# Patient Record
Sex: Male | Born: 1965
Health system: Southern US, Community
[De-identification: ages and names within clinical notes are randomized; demographics above are authoritative.]

## PROBLEM LIST (undated history)

## (undated) DIAGNOSIS — R569 Unspecified convulsions: Secondary | ICD-10-CM

## (undated) DIAGNOSIS — N200 Calculus of kidney: Secondary | ICD-10-CM

## (undated) DIAGNOSIS — I739 Peripheral vascular disease, unspecified: Secondary | ICD-10-CM

## (undated) DIAGNOSIS — I10 Essential (primary) hypertension: Secondary | ICD-10-CM

## (undated) DIAGNOSIS — G8929 Other chronic pain: Secondary | ICD-10-CM

## (undated) DIAGNOSIS — I2510 Atherosclerotic heart disease of native coronary artery without angina pectoris: Secondary | ICD-10-CM

## (undated) DIAGNOSIS — K922 Gastrointestinal hemorrhage, unspecified: Secondary | ICD-10-CM

## (undated) DIAGNOSIS — I209 Angina pectoris, unspecified: Secondary | ICD-10-CM

## (undated) DIAGNOSIS — E7800 Pure hypercholesterolemia, unspecified: Secondary | ICD-10-CM

## (undated) DIAGNOSIS — K219 Gastro-esophageal reflux disease without esophagitis: Secondary | ICD-10-CM

## (undated) DIAGNOSIS — R0602 Shortness of breath: Secondary | ICD-10-CM

## (undated) DIAGNOSIS — M545 Low back pain, unspecified: Secondary | ICD-10-CM

## (undated) DIAGNOSIS — M109 Gout, unspecified: Secondary | ICD-10-CM

## (undated) DIAGNOSIS — T7840XA Allergy, unspecified, initial encounter: Secondary | ICD-10-CM

## (undated) DIAGNOSIS — E109 Type 1 diabetes mellitus without complications: Secondary | ICD-10-CM

## (undated) DIAGNOSIS — R9431 Abnormal electrocardiogram [ECG] [EKG]: Secondary | ICD-10-CM

## (undated) DIAGNOSIS — J45909 Unspecified asthma, uncomplicated: Secondary | ICD-10-CM

## (undated) DIAGNOSIS — F419 Anxiety disorder, unspecified: Secondary | ICD-10-CM

## (undated) DIAGNOSIS — I219 Acute myocardial infarction, unspecified: Secondary | ICD-10-CM

## (undated) DIAGNOSIS — E119 Type 2 diabetes mellitus without complications: Secondary | ICD-10-CM

## (undated) DIAGNOSIS — J189 Pneumonia, unspecified organism: Secondary | ICD-10-CM

## (undated) DIAGNOSIS — M1990 Unspecified osteoarthritis, unspecified site: Secondary | ICD-10-CM

## (undated) DIAGNOSIS — R51 Headache: Secondary | ICD-10-CM

## (undated) HISTORY — DX: Type 2 diabetes mellitus without complications: E11.9

## (undated) HISTORY — DX: Unspecified osteoarthritis, unspecified site: M19.90

## (undated) HISTORY — DX: Allergy, unspecified, initial encounter: T78.40XA

## (undated) HISTORY — DX: Abnormal electrocardiogram (ECG) (EKG): R94.31

---

## 1988-09-19 DIAGNOSIS — J189 Pneumonia, unspecified organism: Secondary | ICD-10-CM

## 1988-09-19 HISTORY — DX: Pneumonia, unspecified organism: J18.9

## 1997-01-19 HISTORY — PX: LACERATION REPAIR: SHX5168

## 1997-01-19 HISTORY — PX: BURR HOLE OF CRANIUM: SUR169

## 1997-06-15 ENCOUNTER — Inpatient Hospital Stay (HOSPITAL_COMMUNITY): Admission: EM | Admit: 1997-06-15 | Discharge: 1997-06-27 | Payer: Self-pay | Admitting: Emergency Medicine

## 1997-06-27 ENCOUNTER — Inpatient Hospital Stay (HOSPITAL_COMMUNITY)
Admission: RE | Admit: 1997-06-27 | Discharge: 1997-08-01 | Payer: Self-pay | Admitting: Physical Medicine and Rehabilitation

## 1997-08-02 ENCOUNTER — Encounter
Admission: RE | Admit: 1997-08-02 | Discharge: 1997-10-31 | Payer: Self-pay | Admitting: Physical Medicine and Rehabilitation

## 2005-10-19 DIAGNOSIS — I219 Acute myocardial infarction, unspecified: Secondary | ICD-10-CM

## 2005-10-19 HISTORY — DX: Acute myocardial infarction, unspecified: I21.9

## 2005-10-19 HISTORY — PX: CORONARY ANGIOPLASTY WITH STENT PLACEMENT: SHX49

## 2007-04-19 ENCOUNTER — Ambulatory Visit: Payer: Self-pay | Admitting: Internal Medicine

## 2007-04-19 ENCOUNTER — Encounter (INDEPENDENT_AMBULATORY_CARE_PROVIDER_SITE_OTHER): Payer: Self-pay | Admitting: Nurse Practitioner

## 2007-04-19 LAB — CONVERTED CEMR LAB
ALT: 34 units/L (ref 0–53)
AST: 30 units/L (ref 0–37)
Albumin: 4.7 g/dL (ref 3.5–5.2)
Basophils Absolute: 0 10*3/uL (ref 0.0–0.1)
Basophils Relative: 0 % (ref 0–1)
CO2: 23 meq/L (ref 19–32)
Creatinine, Ser: 1.28 mg/dL (ref 0.40–1.50)
Glucose, Bld: 104 mg/dL — ABNORMAL HIGH (ref 70–99)
Lymphocytes Relative: 59 % — ABNORMAL HIGH (ref 12–46)
MCHC: 32.7 g/dL (ref 30.0–36.0)
MCV: 85 fL (ref 78.0–100.0)
Monocytes Absolute: 0.5 10*3/uL (ref 0.1–1.0)
Neutro Abs: 1.7 10*3/uL (ref 1.7–7.7)
Potassium: 4.7 meq/L (ref 3.5–5.3)
Sodium: 142 meq/L (ref 135–145)

## 2007-04-22 ENCOUNTER — Ambulatory Visit: Payer: Self-pay | Admitting: *Deleted

## 2007-05-12 ENCOUNTER — Ambulatory Visit: Payer: Self-pay | Admitting: Family Medicine

## 2007-05-12 ENCOUNTER — Encounter (INDEPENDENT_AMBULATORY_CARE_PROVIDER_SITE_OTHER): Payer: Self-pay | Admitting: Nurse Practitioner

## 2007-05-12 LAB — CONVERTED CEMR LAB: Total CHOL/HDL Ratio: 7.2

## 2007-07-11 ENCOUNTER — Ambulatory Visit: Payer: Self-pay | Admitting: Internal Medicine

## 2007-07-26 ENCOUNTER — Encounter (INDEPENDENT_AMBULATORY_CARE_PROVIDER_SITE_OTHER): Payer: Self-pay | Admitting: Nurse Practitioner

## 2007-07-26 ENCOUNTER — Ambulatory Visit: Payer: Self-pay | Admitting: Internal Medicine

## 2007-07-26 LAB — CONVERTED CEMR LAB
AST: 24 units/L (ref 0–37)
Albumin: 4.4 g/dL (ref 3.5–5.2)
Alkaline Phosphatase: 74 units/L (ref 39–117)
Chloride: 106 meq/L (ref 96–112)
Potassium: 4.1 meq/L (ref 3.5–5.3)
Sodium: 142 meq/L (ref 135–145)
Total Protein: 7.5 g/dL (ref 6.0–8.3)
Triglycerides: 79 mg/dL (ref <150)
VLDL: 16 mg/dL (ref 0–40)

## 2007-09-28 ENCOUNTER — Ambulatory Visit: Payer: Self-pay | Admitting: Internal Medicine

## 2007-09-28 LAB — CONVERTED CEMR LAB: Microalb, Ur: 78.8 mg/dL — ABNORMAL HIGH (ref 0.00–1.89)

## 2008-02-08 ENCOUNTER — Encounter: Payer: Self-pay | Admitting: Internal Medicine

## 2008-02-08 ENCOUNTER — Other Ambulatory Visit: Admission: RE | Admit: 2008-02-08 | Discharge: 2008-02-08 | Payer: Self-pay | Admitting: Internal Medicine

## 2008-02-08 ENCOUNTER — Ambulatory Visit: Payer: Self-pay | Admitting: Family Medicine

## 2008-02-28 ENCOUNTER — Ambulatory Visit: Payer: Self-pay | Admitting: Family Medicine

## 2008-03-08 ENCOUNTER — Encounter (INDEPENDENT_AMBULATORY_CARE_PROVIDER_SITE_OTHER): Payer: Self-pay | Admitting: Internal Medicine

## 2008-03-08 ENCOUNTER — Ambulatory Visit: Payer: Self-pay | Admitting: Internal Medicine

## 2008-03-08 LAB — CONVERTED CEMR LAB
ALT: 20 units/L (ref 0–53)
AST: 27 units/L (ref 0–37)
Calcium: 9.6 mg/dL (ref 8.4–10.5)
Chloride: 108 meq/L (ref 96–112)
Cholesterol: 196 mg/dL (ref 0–200)
Creatinine, Ser: 1.09 mg/dL (ref 0.40–1.50)
Total Bilirubin: 0.2 mg/dL — ABNORMAL LOW (ref 0.3–1.2)
VLDL: 26 mg/dL (ref 0–40)

## 2008-05-10 ENCOUNTER — Ambulatory Visit: Payer: Self-pay | Admitting: Internal Medicine

## 2008-05-15 ENCOUNTER — Ambulatory Visit: Payer: Self-pay | Admitting: Internal Medicine

## 2008-05-22 ENCOUNTER — Ambulatory Visit: Payer: Self-pay | Admitting: Internal Medicine

## 2008-05-22 ENCOUNTER — Encounter (INDEPENDENT_AMBULATORY_CARE_PROVIDER_SITE_OTHER): Payer: Self-pay | Admitting: Internal Medicine

## 2008-05-22 LAB — CONVERTED CEMR LAB
Bilirubin Urine: NEGATIVE
Nitrite: NEGATIVE
Protein, ur: 100 mg/dL — AB
RBC / HPF: NONE SEEN (ref <3)
Urobilinogen, UA: 0.2 (ref 0.0–1.0)
pH: 5.5 (ref 5.0–8.0)

## 2008-07-04 ENCOUNTER — Ambulatory Visit: Payer: Self-pay | Admitting: Internal Medicine

## 2008-07-16 ENCOUNTER — Ambulatory Visit: Payer: Self-pay | Admitting: Internal Medicine

## 2008-08-28 ENCOUNTER — Ambulatory Visit: Payer: Self-pay | Admitting: Internal Medicine

## 2008-09-05 ENCOUNTER — Ambulatory Visit: Payer: Self-pay | Admitting: Internal Medicine

## 2008-09-11 ENCOUNTER — Ambulatory Visit: Payer: Self-pay | Admitting: Internal Medicine

## 2008-10-25 ENCOUNTER — Ambulatory Visit: Payer: Self-pay | Admitting: Family Medicine

## 2008-11-15 ENCOUNTER — Ambulatory Visit (HOSPITAL_COMMUNITY): Admission: RE | Admit: 2008-11-15 | Discharge: 2008-11-15 | Payer: Self-pay | Admitting: Internal Medicine

## 2008-11-15 ENCOUNTER — Encounter: Payer: Self-pay | Admitting: Cardiovascular Disease

## 2008-11-15 ENCOUNTER — Encounter (INDEPENDENT_AMBULATORY_CARE_PROVIDER_SITE_OTHER): Payer: Self-pay | Admitting: Internal Medicine

## 2008-11-15 ENCOUNTER — Ambulatory Visit: Payer: Self-pay | Admitting: Internal Medicine

## 2008-11-15 LAB — CONVERTED CEMR LAB
Amphetamine Screen, Ur: NEGATIVE
Barbiturate Quant, Ur: NEGATIVE
Creatinine,U: 146.3 mg/dL
HCT: 41.5 % (ref 39.0–52.0)
Hemoglobin: 13.4 g/dL (ref 13.0–17.0)
Hgb A1c MFr Bld: 6.5 % — ABNORMAL HIGH (ref 4.6–6.1)
Lymphs Abs: 4.2 10*3/uL — ABNORMAL HIGH (ref 0.7–4.0)
MCV: 87 fL (ref 78.0–100.0)
Methadone: NEGATIVE
Monocytes Relative: 10 % (ref 3–12)
Neutro Abs: 1.8 10*3/uL (ref 1.7–7.7)
Opiate Screen, Urine: NEGATIVE
Propoxyphene: NEGATIVE

## 2008-11-23 ENCOUNTER — Ambulatory Visit: Payer: Self-pay | Admitting: Internal Medicine

## 2008-11-23 ENCOUNTER — Encounter (INDEPENDENT_AMBULATORY_CARE_PROVIDER_SITE_OTHER): Payer: Self-pay | Admitting: Internal Medicine

## 2008-11-23 LAB — CONVERTED CEMR LAB
Albumin: 4.4 g/dL (ref 3.5–5.2)
Calcium: 9.4 mg/dL (ref 8.4–10.5)
Creatinine, Ser: 1.14 mg/dL (ref 0.40–1.50)
HDL: 54 mg/dL (ref >39)
Sodium: 141 meq/L (ref 135–145)
Total Bilirubin: 0.3 mg/dL (ref 0.3–1.2)
Total CHOL/HDL Ratio: 5.2
Total Protein: 7.3 g/dL (ref 6.0–8.3)
Triglycerides: 186 mg/dL — ABNORMAL HIGH (ref <150)

## 2008-11-29 ENCOUNTER — Ambulatory Visit: Payer: Self-pay | Admitting: Internal Medicine

## 2008-12-11 DIAGNOSIS — R079 Chest pain, unspecified: Secondary | ICD-10-CM

## 2008-12-12 ENCOUNTER — Ambulatory Visit: Payer: Self-pay | Admitting: Cardiovascular Disease

## 2008-12-12 DIAGNOSIS — R9431 Abnormal electrocardiogram [ECG] [EKG]: Secondary | ICD-10-CM

## 2009-01-08 ENCOUNTER — Ambulatory Visit: Payer: Self-pay

## 2009-01-08 ENCOUNTER — Encounter: Payer: Self-pay | Admitting: Cardiovascular Disease

## 2009-01-08 ENCOUNTER — Ambulatory Visit (HOSPITAL_COMMUNITY): Admission: RE | Admit: 2009-01-08 | Discharge: 2009-01-08 | Payer: Self-pay | Admitting: Cardiovascular Disease

## 2009-01-08 ENCOUNTER — Ambulatory Visit: Payer: Self-pay | Admitting: Cardiology

## 2009-01-22 ENCOUNTER — Ambulatory Visit: Payer: Self-pay | Admitting: Internal Medicine

## 2009-01-22 LAB — CONVERTED CEMR LAB
Albumin: 4.2 g/dL (ref 3.5–5.2)
Alkaline Phosphatase: 72 units/L (ref 39–117)
Bilirubin, Direct: 0.1 mg/dL (ref 0.0–0.3)
Cholesterol: 167 mg/dL (ref 0–200)
Indirect Bilirubin: 0.2 mg/dL (ref 0.0–0.9)
Total Protein: 7.2 g/dL (ref 6.0–8.3)
VLDL: 20 mg/dL (ref 0–40)

## 2009-03-13 ENCOUNTER — Ambulatory Visit: Payer: Self-pay | Admitting: Family Medicine

## 2009-03-13 ENCOUNTER — Encounter (INDEPENDENT_AMBULATORY_CARE_PROVIDER_SITE_OTHER): Payer: Self-pay | Admitting: Internal Medicine

## 2009-03-13 LAB — CONVERTED CEMR LAB: Helicobacter Pylori Antibody-IgG: 0.4

## 2009-03-15 ENCOUNTER — Ambulatory Visit: Payer: Self-pay | Admitting: Internal Medicine

## 2009-04-15 ENCOUNTER — Emergency Department (HOSPITAL_COMMUNITY): Admission: EM | Admit: 2009-04-15 | Discharge: 2009-04-15 | Payer: Self-pay | Admitting: Emergency Medicine

## 2009-04-26 ENCOUNTER — Ambulatory Visit: Payer: Self-pay | Admitting: Internal Medicine

## 2009-04-29 ENCOUNTER — Ambulatory Visit (HOSPITAL_COMMUNITY): Admission: RE | Admit: 2009-04-29 | Discharge: 2009-04-29 | Payer: Self-pay | Admitting: Internal Medicine

## 2009-05-08 ENCOUNTER — Ambulatory Visit: Payer: Self-pay | Admitting: Family Medicine

## 2009-05-08 ENCOUNTER — Encounter (INDEPENDENT_AMBULATORY_CARE_PROVIDER_SITE_OTHER): Payer: Self-pay | Admitting: Internal Medicine

## 2009-05-08 LAB — CONVERTED CEMR LAB: CRP: 0.1 mg/dL (ref <0.6)

## 2009-05-18 ENCOUNTER — Emergency Department (HOSPITAL_COMMUNITY): Admission: EM | Admit: 2009-05-18 | Discharge: 2009-05-18 | Payer: Self-pay | Admitting: Emergency Medicine

## 2009-05-23 ENCOUNTER — Ambulatory Visit: Payer: Self-pay | Admitting: Internal Medicine

## 2009-05-23 LAB — CONVERTED CEMR LAB
Calcium: 10.1 mg/dL (ref 8.4–10.5)
Chloride: 109 meq/L (ref 96–112)
Glucose, Bld: 122 mg/dL — ABNORMAL HIGH (ref 70–99)

## 2009-05-29 ENCOUNTER — Ambulatory Visit (HOSPITAL_COMMUNITY): Admission: RE | Admit: 2009-05-29 | Discharge: 2009-05-29 | Payer: Self-pay | Admitting: Internal Medicine

## 2009-05-31 ENCOUNTER — Telehealth (INDEPENDENT_AMBULATORY_CARE_PROVIDER_SITE_OTHER): Payer: Self-pay | Admitting: *Deleted

## 2009-06-05 ENCOUNTER — Ambulatory Visit: Payer: Self-pay | Admitting: Family Medicine

## 2009-06-08 ENCOUNTER — Observation Stay (HOSPITAL_COMMUNITY): Admission: EM | Admit: 2009-06-08 | Discharge: 2009-06-08 | Payer: Self-pay | Admitting: Emergency Medicine

## 2009-06-14 ENCOUNTER — Ambulatory Visit: Payer: Self-pay | Admitting: Internal Medicine

## 2009-06-25 ENCOUNTER — Emergency Department (HOSPITAL_COMMUNITY): Admission: EM | Admit: 2009-06-25 | Discharge: 2009-06-26 | Payer: Self-pay | Admitting: Emergency Medicine

## 2009-07-01 ENCOUNTER — Ambulatory Visit: Payer: Self-pay | Admitting: Internal Medicine

## 2009-07-02 ENCOUNTER — Encounter (INDEPENDENT_AMBULATORY_CARE_PROVIDER_SITE_OTHER): Payer: Self-pay | Admitting: Internal Medicine

## 2009-07-12 ENCOUNTER — Ambulatory Visit (HOSPITAL_COMMUNITY): Admission: RE | Admit: 2009-07-12 | Discharge: 2009-07-12 | Payer: Self-pay | Admitting: Internal Medicine

## 2009-07-16 ENCOUNTER — Ambulatory Visit: Payer: Self-pay | Admitting: Internal Medicine

## 2009-09-11 ENCOUNTER — Ambulatory Visit: Payer: Self-pay | Admitting: Internal Medicine

## 2009-09-11 ENCOUNTER — Other Ambulatory Visit: Admission: RE | Admit: 2009-09-11 | Discharge: 2009-09-11 | Payer: Self-pay | Admitting: Internal Medicine

## 2009-09-13 ENCOUNTER — Ambulatory Visit: Payer: Self-pay | Admitting: Internal Medicine

## 2009-09-13 ENCOUNTER — Other Ambulatory Visit: Admission: RE | Admit: 2009-09-13 | Discharge: 2009-09-13 | Payer: Self-pay | Admitting: Family Medicine

## 2010-02-09 ENCOUNTER — Encounter: Payer: Self-pay | Admitting: Internal Medicine

## 2010-02-18 NOTE — Progress Notes (Signed)
  Request recieved from DDS sent to [facility]  May 31, 2009 2:08 PM

## 2010-04-07 LAB — POCT I-STAT, CHEM 8
BUN: 41 mg/dL — ABNORMAL HIGH (ref 6–23)
Chloride: 108 mEq/L (ref 96–112)
Glucose, Bld: 106 mg/dL — ABNORMAL HIGH (ref 70–99)
Potassium: 5.1 mEq/L (ref 3.5–5.1)
Sodium: 137 mEq/L (ref 135–145)
TCO2: 26 mmol/L (ref 0–100)

## 2010-04-08 LAB — BASIC METABOLIC PANEL WITH GFR
BUN: 32 mg/dL — ABNORMAL HIGH (ref 6–23)
CO2: 22 meq/L (ref 19–32)
Chloride: 109 meq/L (ref 96–112)
Creatinine, Ser: 1.61 mg/dL — ABNORMAL HIGH (ref 0.4–1.5)
GFR calc Af Amer: 56 mL/min — ABNORMAL LOW (ref >60)
Glucose, Bld: 123 mg/dL — ABNORMAL HIGH (ref 70–99)
Sodium: 138 meq/L (ref 135–145)

## 2010-04-08 LAB — BASIC METABOLIC PANEL
Calcium: 11 mg/dL — ABNORMAL HIGH (ref 8.4–10.5)
GFR calc non Af Amer: 46 mL/min — ABNORMAL LOW (ref >60)
Potassium: 4.4 mEq/L (ref 3.5–5.1)

## 2010-04-08 LAB — URINALYSIS, ROUTINE W REFLEX MICROSCOPIC
Bilirubin Urine: NEGATIVE
Glucose, UA: NEGATIVE mg/dL
Ketones, ur: NEGATIVE mg/dL
Leukocytes, UA: NEGATIVE
Nitrite: NEGATIVE
Protein, ur: NEGATIVE mg/dL
Specific Gravity, Urine: 1.012 (ref 1.005–1.030)
Urobilinogen, UA: 0.2 mg/dL (ref 0.0–1.0)
pH: 5 (ref 5.0–8.0)

## 2010-04-08 LAB — URINE MICROSCOPIC-ADD ON

## 2010-04-08 LAB — CBC
Hemoglobin: 13.9 g/dL (ref 13.0–17.0)
MCV: 89.3 fL (ref 78.0–100.0)
RDW: 14.2 % (ref 11.5–15.5)

## 2010-04-08 LAB — DIFFERENTIAL
Basophils Absolute: 0 10*3/uL (ref 0.0–0.1)
Basophils Relative: 1 % (ref 0–1)
Eosinophils Absolute: 0.3 10*3/uL (ref 0.0–0.7)
Monocytes Relative: 10 % (ref 3–12)
Neutro Abs: 2.9 10*3/uL (ref 1.7–7.7)
Neutrophils Relative %: 37 % — ABNORMAL LOW (ref 43–77)

## 2010-04-08 LAB — CK: Total CK: 466 U/L — ABNORMAL HIGH (ref 7–232)

## 2010-04-14 LAB — POCT I-STAT, CHEM 8
Chloride: 108 mEq/L (ref 96–112)
Glucose, Bld: 121 mg/dL — ABNORMAL HIGH (ref 70–99)
HCT: 44 % (ref 39.0–52.0)
Hemoglobin: 15 g/dL (ref 13.0–17.0)
Potassium: 4.3 mEq/L (ref 3.5–5.1)
Sodium: 142 mEq/L (ref 135–145)

## 2010-04-14 LAB — URINALYSIS, ROUTINE W REFLEX MICROSCOPIC
Glucose, UA: NEGATIVE mg/dL
Ketones, ur: NEGATIVE mg/dL
Protein, ur: 30 mg/dL — AB
Urobilinogen, UA: 0.2 mg/dL (ref 0.0–1.0)

## 2010-04-14 LAB — DIFFERENTIAL
Basophils Absolute: 0.1 10*3/uL (ref 0.0–0.1)
Basophils Relative: 1 % (ref 0–1)
Eosinophils Absolute: 0.3 10*3/uL (ref 0.0–0.7)
Neutro Abs: 2.3 10*3/uL (ref 1.7–7.7)
Neutrophils Relative %: 36 % — ABNORMAL LOW (ref 43–77)

## 2010-04-14 LAB — URINE CULTURE
Colony Count: NO GROWTH
Culture: NO GROWTH

## 2010-04-14 LAB — URINE MICROSCOPIC-ADD ON

## 2010-04-14 LAB — CBC
MCHC: 30.6 g/dL (ref 30.0–36.0)
Platelets: 265 10*3/uL (ref 150–400)
RDW: 14.3 % (ref 11.5–15.5)

## 2010-04-14 LAB — CK: Total CK: 755 U/L — ABNORMAL HIGH (ref 7–232)

## 2011-09-08 ENCOUNTER — Encounter: Payer: Self-pay | Admitting: Cardiovascular Disease

## 2011-09-08 ENCOUNTER — Encounter: Payer: Self-pay | Admitting: *Deleted

## 2011-09-09 ENCOUNTER — Ambulatory Visit (INDEPENDENT_AMBULATORY_CARE_PROVIDER_SITE_OTHER): Payer: Self-pay | Admitting: Cardiovascular Disease

## 2011-09-09 ENCOUNTER — Encounter: Payer: Self-pay | Admitting: Cardiovascular Disease

## 2011-09-09 ENCOUNTER — Encounter: Payer: Self-pay | Admitting: *Deleted

## 2011-09-09 VITALS — BP 130/80 | HR 62 | Resp 12 | Ht 71.0 in | Wt 200.4 lb

## 2011-09-09 DIAGNOSIS — E782 Mixed hyperlipidemia: Secondary | ICD-10-CM | POA: Insufficient documentation

## 2011-09-09 DIAGNOSIS — Z01810 Encounter for preprocedural cardiovascular examination: Secondary | ICD-10-CM

## 2011-09-09 DIAGNOSIS — E119 Type 2 diabetes mellitus without complications: Secondary | ICD-10-CM

## 2011-09-09 DIAGNOSIS — R079 Chest pain, unspecified: Secondary | ICD-10-CM

## 2011-09-09 DIAGNOSIS — I10 Essential (primary) hypertension: Secondary | ICD-10-CM | POA: Insufficient documentation

## 2011-09-09 NOTE — Progress Notes (Signed)
Patient ID: Willie Walker, male   DOB: 05/19/1965, 46 y.o.   MRN: [id number] 46 yo referred by health server for chest pain.  Last seen here in 2010 for atypical pain with normal stress echo History of diabetes A1c on labs 08/05/11 8.1  Reviewed other labs and cholesterol 93 baseline Cr 1.5  Has a card from [facility] in [place] from 10/2005 indicating Cypher stent to OM, Cypher stent to Mid circumflex and 6 cypher stents to RCA Having SSCP.  Similar to previous pain but not as bad.  Interestingly this history was not given in 2010 when seen.  Compliant with meds for DM and no resting pain  ROS: Denies fever, malais, weight loss, blurry vision, decreased visual acuity, cough, sputum, SOB, hemoptysis, pleuritic pain, palpitaitons, heartburn, abdominal pain, melena, lower extremity edema, claudication, or rash.  All other systems reviewed and negative   General: Affect appropriate Healthy:  appears stated age HEENT: normal Neck supple with no adenopathy JVP normal no bruits no thyromegaly Lungs clear with no wheezing and good diaphragmatic motion Heart:  S1/S2 no murmur,rub, gallop or click PMI normal Abdomen: benighn, BS positve, no tenderness, no AAA no bruit.  No HSM or HJR Distal pulses intact with no bruits No edema Neuro non-focal Skin warm and dry No muscular weakness  Medications Current Outpatient Prescriptions  Medication Sig Dispense Refill  . colchicine 0.6 MG tablet Take 0.6 mg by mouth daily.      . indomethacin (INDOCIN) 50 MG capsule Take 50 mg by mouth 2 (two) times daily with a meal.        Allergies Review of patient's allergies indicates not on file.  Family History: No family history on file.  Social History: History   Social History  . Marital Status: Single    Spouse Name: N/A    Number of Children: N/A  . Years of Education: N/A   Occupational History  . Not on file.   Social History Main Topics  . Smoking status: Not on file  . Smokeless  tobacco: Not on file  . Alcohol Use: Not on file  . Drug Use: Not on file  . Sexually Active: Not on file   Other Topics Concern  . Not on file   Social History Narrative  . No narrative on file    Electrocardiogram:  NSR rate 69  LVH and T wave inversion 3,F  Assessment and Plan   

## 2011-09-09 NOTE — Patient Instructions (Addendum)
Your physician recommends that you schedule a follow-up appointment in: AFTER CATH Your physician recommends that you continue on your current medications as directed. Please refer to the Current Medication list given to you today. Your physician has requested that you have a cardiac catheterization. Cardiac catheterization is used to diagnose and/or treat various heart conditions. Doctors may recommend this procedure for a number of different reasons. The most common reason is to evaluate chest pain. Chest pain can be a symptom of coronary artery disease (CAD), and cardiac catheterization can show whether plaque is narrowing or blocking your heart's arteries. This procedure is also used to evaluate the valves, as well as measure the blood flow and oxygen levels in different parts of your heart. For further information please visit [email or web address] Please follow instruction sheet, as given. Your physician recommends that you return for lab work in:  TODAY BMET CBC INR   DX V72.81  CHEST PAIN

## 2011-09-09 NOTE — Assessment & Plan Note (Signed)
Well controlled.  Continue current medications and low sodium Dash type diet.    

## 2011-09-09 NOTE — Assessment & Plan Note (Signed)
Cholesterol is at goal.  Continue current dose of statin and diet Rx.  No myalgias or side effects.  F/U  LFT's in 6 months. Lab Results  Component Value Date   LDLCALC 98 01/22/2009             

## 2011-09-09 NOTE — Assessment & Plan Note (Signed)
Will get records from [place].  Seems like may have had disection of RCA with that many stents  Continue ASA  Given pain and amount of stents will need cath.  Discussed risks and possibility of radial approach  Willing to proceed.  Labs today .

## 2011-09-09 NOTE — Assessment & Plan Note (Signed)
Discussed low carb diet.  Target hemoglobin A1c is 6.5 or less.  Continue current medications. Hold glucophage post cath. Marland Kitchen

## 2011-09-10 LAB — BASIC METABOLIC PANEL
CO2: 27 mEq/L (ref 19–32)
Chloride: 101 mEq/L (ref 96–112)
Creatinine, Ser: 1.6 mg/dL — ABNORMAL HIGH (ref 0.4–1.5)
Glucose, Bld: 147 mg/dL — ABNORMAL HIGH (ref 70–99)

## 2011-09-10 LAB — CBC WITH DIFFERENTIAL/PLATELET
Basophils Absolute: 0.1 10*3/uL (ref 0.0–0.1)
Eosinophils Relative: 4.7 % (ref 0.0–5.0)
Hemoglobin: 12.5 g/dL — ABNORMAL LOW (ref 13.0–17.0)
Lymphocytes Relative: 63.5 % — ABNORMAL HIGH (ref 12.0–46.0)
Monocytes Relative: 14.5 % — ABNORMAL HIGH (ref 3.0–12.0)
Neutro Abs: 1.2 10*3/uL — ABNORMAL LOW (ref 1.4–7.7)
RBC: 4.45 Mil/uL (ref 4.22–5.81)
RDW: 16.3 % — ABNORMAL HIGH (ref 11.5–14.6)
WBC: 7.3 10*3/uL (ref 4.5–10.5)

## 2011-09-10 LAB — PROTIME-INR: INR: 0.9 ratio (ref 0.8–1.0)

## 2011-09-11 ENCOUNTER — Other Ambulatory Visit: Payer: Self-pay | Admitting: Cardiovascular Disease

## 2011-09-11 DIAGNOSIS — R079 Chest pain, unspecified: Secondary | ICD-10-CM

## 2011-09-17 ENCOUNTER — Encounter (HOSPITAL_COMMUNITY): Payer: Self-pay | Admitting: General Practice

## 2011-09-17 ENCOUNTER — Inpatient Hospital Stay (HOSPITAL_BASED_OUTPATIENT_CLINIC_OR_DEPARTMENT_OTHER)
Admission: RE | Admit: 2011-09-17 | Discharge: 2011-09-17 | Disposition: A | Discharge status: Hospice | Payer: Medicaid Other | Source: Ambulatory Visit | Attending: Cardiovascular Disease | Admitting: Cardiovascular Disease

## 2011-09-17 ENCOUNTER — Inpatient Hospital Stay (HOSPITAL_COMMUNITY)
Admission: AD | Admit: 2011-09-17 | Discharge: 2011-09-19 | DRG: 251 | Disposition: A | Discharge status: Home / Self Care | Payer: Medicaid Other | Source: Ambulatory Visit | Attending: Cardiovascular Disease | Admitting: Cardiovascular Disease

## 2011-09-17 ENCOUNTER — Encounter (HOSPITAL_BASED_OUTPATIENT_CLINIC_OR_DEPARTMENT_OTHER)
Admission: RE | Disposition: A | Discharge status: Hospice | Payer: Self-pay | Source: Ambulatory Visit | Attending: Cardiovascular Disease

## 2011-09-17 DIAGNOSIS — T82897A Other specified complication of cardiac prosthetic devices, implants and grafts, initial encounter: Principal | ICD-10-CM | POA: Diagnosis present

## 2011-09-17 DIAGNOSIS — K6389 Other specified diseases of intestine: Secondary | ICD-10-CM

## 2011-09-17 DIAGNOSIS — I2510 Atherosclerotic heart disease of native coronary artery without angina pectoris: Secondary | ICD-10-CM | POA: Diagnosis present

## 2011-09-17 DIAGNOSIS — I200 Unstable angina: Secondary | ICD-10-CM | POA: Diagnosis present

## 2011-09-17 DIAGNOSIS — I10 Essential (primary) hypertension: Secondary | ICD-10-CM | POA: Diagnosis present

## 2011-09-17 DIAGNOSIS — E782 Mixed hyperlipidemia: Secondary | ICD-10-CM | POA: Diagnosis present

## 2011-09-17 DIAGNOSIS — G8929 Other chronic pain: Secondary | ICD-10-CM | POA: Diagnosis present

## 2011-09-17 DIAGNOSIS — E119 Type 2 diabetes mellitus without complications: Secondary | ICD-10-CM | POA: Diagnosis present

## 2011-09-17 DIAGNOSIS — Y92009 Unspecified place in unspecified non-institutional (private) residence as the place of occurrence of the external cause: Secondary | ICD-10-CM

## 2011-09-17 DIAGNOSIS — Y849 Medical procedure, unspecified as the cause of abnormal reaction of the patient, or of later complication, without mention of misadventure at the time of the procedure: Secondary | ICD-10-CM | POA: Insufficient documentation

## 2011-09-17 DIAGNOSIS — M545 Low back pain, unspecified: Secondary | ICD-10-CM | POA: Diagnosis present

## 2011-09-17 DIAGNOSIS — E785 Hyperlipidemia, unspecified: Secondary | ICD-10-CM | POA: Diagnosis present

## 2011-09-17 DIAGNOSIS — K219 Gastro-esophageal reflux disease without esophagitis: Secondary | ICD-10-CM | POA: Diagnosis present

## 2011-09-17 DIAGNOSIS — Y831 Surgical operation with implant of artificial internal device as the cause of abnormal reaction of the patient, or of later complication, without mention of misadventure at the time of the procedure: Secondary | ICD-10-CM | POA: Diagnosis present

## 2011-09-17 DIAGNOSIS — I739 Peripheral vascular disease, unspecified: Secondary | ICD-10-CM | POA: Diagnosis present

## 2011-09-17 DIAGNOSIS — I252 Old myocardial infarction: Secondary | ICD-10-CM

## 2011-09-17 DIAGNOSIS — R079 Chest pain, unspecified: Secondary | ICD-10-CM

## 2011-09-17 DIAGNOSIS — M109 Gout, unspecified: Secondary | ICD-10-CM | POA: Diagnosis present

## 2011-09-17 DIAGNOSIS — F411 Generalized anxiety disorder: Secondary | ICD-10-CM | POA: Diagnosis present

## 2011-09-17 HISTORY — DX: Pneumonia, unspecified organism: J18.9

## 2011-09-17 HISTORY — DX: Anxiety disorder, unspecified: F41.9

## 2011-09-17 HISTORY — DX: Gout, unspecified: M10.9

## 2011-09-17 HISTORY — DX: Headache: R51

## 2011-09-17 HISTORY — DX: Gastrointestinal hemorrhage, unspecified: K92.2

## 2011-09-17 HISTORY — DX: Acute myocardial infarction, unspecified: I21.9

## 2011-09-17 HISTORY — DX: Atherosclerotic heart disease of native coronary artery without angina pectoris: I25.10

## 2011-09-17 HISTORY — DX: Low back pain, unspecified: M54.50

## 2011-09-17 HISTORY — DX: Gastro-esophageal reflux disease without esophagitis: K21.9

## 2011-09-17 HISTORY — DX: Angina pectoris, unspecified: I20.9

## 2011-09-17 HISTORY — DX: Peripheral vascular disease, unspecified: I73.9

## 2011-09-17 HISTORY — DX: Low back pain: M54.5

## 2011-09-17 HISTORY — DX: Other chronic pain: G89.29

## 2011-09-17 HISTORY — DX: Type 1 diabetes mellitus without complications: E10.9

## 2011-09-17 HISTORY — DX: Pure hypercholesterolemia, unspecified: E78.00

## 2011-09-17 HISTORY — DX: Shortness of breath: R06.02

## 2011-09-17 HISTORY — DX: Unspecified convulsions: R56.9

## 2011-09-17 HISTORY — DX: Essential (primary) hypertension: I10

## 2011-09-17 LAB — GLUCOSE, CAPILLARY: Glucose-Capillary: 275 mg/dL — ABNORMAL HIGH (ref 70–99)

## 2011-09-17 SURGERY — JV LEFT HEART CATHETERIZATION WITH CORONARY ANGIOGRAM
Anesthesia: Moderate Sedation

## 2011-09-17 MED ORDER — NITROGLYCERIN 0.4 MG SL SUBL: 0.4000 mg | SUBLINGUAL_TABLET | SUBLINGUAL | Status: DC | PRN

## 2011-09-17 MED ORDER — AMITRIPTYLINE HCL 25 MG PO TABS
25.0000 mg | ORAL_TABLET | Freq: Every day | ORAL | Status: DC
  Administered 2011-09-17 – 2011-09-18 (×2): 25 mg via ORAL
  Filled 2011-09-17 (×4): qty 1

## 2011-09-17 MED ORDER — HEPARIN (PORCINE) IN NACL 100-0.45 UNIT/ML-% IJ SOLN
1400.0000 [IU]/h | INTRAMUSCULAR | Status: DC
  Administered 2011-09-17: 1400 [IU]/h via INTRAVENOUS
  Filled 2011-09-17 (×2): qty 250

## 2011-09-17 MED ORDER — ASPIRIN 81 MG PO CHEW
324.0000 mg | CHEWABLE_TABLET | ORAL | Status: AC
  Administered 2011-09-18: 324 mg via ORAL
  Filled 2011-09-17: qty 14
  Filled 2011-09-17: qty 4

## 2011-09-17 MED ORDER — ASPIRIN 81 MG PO TABS
81.0000 mg | ORAL_TABLET | Freq: Every day | ORAL | Status: DC
  Administered 2011-09-19: 81 mg via ORAL
  Filled 2011-09-17 (×2): qty 1

## 2011-09-17 MED ORDER — CLOPIDOGREL BISULFATE 75 MG PO TABS
75.0000 mg | ORAL_TABLET | Freq: Every day | ORAL | Status: DC
  Administered 2011-09-18 – 2011-09-19 (×2): 75 mg via ORAL
  Filled 2011-09-17 (×2): qty 1

## 2011-09-17 MED ORDER — ASPIRIN 81 MG PO CHEW
324.0000 mg | CHEWABLE_TABLET | ORAL | Status: AC
Start: 2011-09-18 — End: 2011-09-17
  Administered 2011-09-17: 324 mg via ORAL

## 2011-09-17 MED ORDER — SODIUM CHLORIDE 0.9 % IV SOLN
INTRAVENOUS | Status: DC
  Administered 2011-09-18 (×2): via INTRAVENOUS

## 2011-09-17 MED ORDER — METOPROLOL SUCCINATE ER 25 MG PO TB24
25.0000 mg | ORAL_TABLET | Freq: Every day | ORAL | Status: DC
  Administered 2011-09-18 – 2011-09-19 (×2): 25 mg via ORAL
  Filled 2011-09-17 (×2): qty 1

## 2011-09-17 MED ORDER — ONDANSETRON HCL 4 MG/2ML IJ SOLN: 4.0000 mg | Freq: Four times a day (QID) | INTRAMUSCULAR | Status: DC | PRN

## 2011-09-17 MED ORDER — SODIUM CHLORIDE 0.9 % IJ SOLN: 3.0000 mL | INTRAMUSCULAR | Status: DC | PRN

## 2011-09-17 MED ORDER — INSULIN ASPART 100 UNIT/ML ~~LOC~~ SOLN
0.0000 [IU] | Freq: Three times a day (TID) | SUBCUTANEOUS | Status: DC
  Administered 2011-09-18: 3 [IU] via SUBCUTANEOUS
  Administered 2011-09-18: 2 [IU] via SUBCUTANEOUS
  Administered 2011-09-18: 17:00:00 via SUBCUTANEOUS

## 2011-09-17 MED ORDER — DIAZEPAM 5 MG PO TABS
5.0000 mg | ORAL_TABLET | ORAL | Status: AC
  Administered 2011-09-17: 5 mg via ORAL

## 2011-09-17 MED ORDER — CLOPIDOGREL BISULFATE 75 MG PO TABS
600.0000 mg | ORAL_TABLET | Freq: Once | ORAL | Status: AC
  Administered 2011-09-17: 600 mg via ORAL
  Filled 2011-09-17: qty 8

## 2011-09-17 MED ORDER — SODIUM CHLORIDE 0.9 % IV SOLN
INTRAVENOUS | Status: DC
  Administered 2011-09-17 (×2): via INTRAVENOUS

## 2011-09-17 MED ORDER — ACETAMINOPHEN 325 MG PO TABS: 650.0000 mg | ORAL_TABLET | ORAL | Status: DC | PRN

## 2011-09-17 MED ORDER — SIMVASTATIN 40 MG PO TABS
40.0000 mg | ORAL_TABLET | Freq: Every day | ORAL | Status: DC
  Administered 2011-09-18: 21:00:00 40 mg via ORAL
  Filled 2011-09-17 (×4): qty 1

## 2011-09-17 MED ORDER — SODIUM CHLORIDE 0.9 % IJ SOLN: 3.0000 mL | Freq: Two times a day (BID) | INTRAMUSCULAR | Status: DC

## 2011-09-17 MED ORDER — LISINOPRIL 10 MG PO TABS
10.0000 mg | ORAL_TABLET | Freq: Every day | ORAL | Status: DC
  Administered 2011-09-18 – 2011-09-19 (×2): 10 mg via ORAL
  Filled 2011-09-17 (×2): qty 1

## 2011-09-17 MED ORDER — SODIUM CHLORIDE 0.9 % IV SOLN: 250.0000 mL | INTRAVENOUS | Status: DC | PRN

## 2011-09-17 NOTE — Progress Notes (Signed)
ANTICOAGULATION CONSULT NOTE - Initial Consult  Pharmacy Consult for Heparin Indication: CP, awaiting stent  Allergies  Allergen Reactions  . Crestor (Rosuvastatin)     "Makes my legs hurt" - tolerates simvastatin     Patient Measurements: 91 kg  Medical History: Past Medical History  Diagnosis Date  . CHEST PAIN   . ELECTROCARDIOGRAM, ABNORMAL    Assessment: 46 year old s/p cath, now awaiting stent To begin 8 hours after sheath removal  Goal of Therapy:  Heparin level 0.3-0.7 units/ml Monitor platelets by anticoagulation protocol: Yes   Plan:  1) Heparin drip at 1400 units / hr to begin at 10 pm 2) Follow up daily heparin level, CBC  Thank you. Okey Regal, PharmD [phone]  09/17/2011,4:43 PM

## 2011-09-17 NOTE — OR Nursing (Signed)
Report called to Jessica, RN on 3W 

## 2011-09-17 NOTE — CV Procedure (Signed)
Catheterization  Name: Willie Walker MRN: [id number] DOB: Sep 12, 1965  Indication: Chest pain   Procedure:  After informed consent and clinical "time out" the right radial artery was prepped and draped in a sterile fashion. Allen's test was documented to be normal both by manual compression and plethosmography prior to cannulation.  A 5Fr hydrophilic sheath was advanced using a .025 nitinol wire. Catheters were advanced into the ascending aortic root with a .035 J wire and fluoroscopic guidance.  Standard TIG  and JR4 catheters were used to engage the coronaries.  A standard angled pigtail catheter was used for ventriculography.  Coronary arteries were visualized in orthogonal views using caudal and cranial angulation.  RAO ventriculography was done using 10  cc of contrast.    Medications:   Versed: 3 mg's  Fentanyl: 25 ug's  Heparin: 4500 units  Verapamil: 3 mg's  Coronary Arteries:  Right dominant with no anomalies  LM: 30% distal  LAD: proximal- normal, mid-60% diffuse, distal-40% multiple discrete  IM- Large vessel 30% instent restenosis  D1- 40-50% multiple discrete lesions   Circumflex: Single OM/AV groove branch 95% instent restenosis  RCA: Likely previously disected during attempted PCI with full metal jacket 90% proximal instent restonosis.  Diffuse 80% instent restenosis distally extending into PDA And large RV branch  Ventriculography: EF: 60%%,  No RWMA;s  Hemodynamics:  Aortic Pressure: 100/63 mmHg  LV Pressure: 100/ 16  mmHg  Impression:  Discussed case with Dr Excell Seltzer and [place].  Best approach would be to try to fix proximal RCA and OM and then Rx medically.  Distal RCA not likely  Salvageable.  Admit heparin and load with Plavix.    At the end of the case a TR band was placed with good hemostasis and flow to the hand including the radial aspect of the thumb.

## 2011-09-17 NOTE — Progress Notes (Signed)
Discussed pt with Dr. Eden Emms for admission orders. I called [facility] lab and holding area to clarify that pt had not been given Plavix prior to arrival to floor; will load with 600mg  now and start 75mg  daily tomorrow. I also clarified with pt that he had already taken his lisinopril and metoprolol today. Per discussion with Dr. Eden Emms, back down on Toprol to 25mg  daily (next dose due tmrw AM). Plan cath with PCI tmrw. Saqib Cazarez PA-C

## 2011-09-17 NOTE — Progress Notes (Signed)
Patient refusing all insulin at this time.  Ronie Spies, PA notified and will continue to monitor. Nolon Nations

## 2011-09-17 NOTE — Progress Notes (Signed)
Attempted to draw 3 cc's of air from TR band, bleeding occurred.  Back to 12 cc in tr band.

## 2011-09-17 NOTE — H&P (View-Only) (Signed)
Patient ID: Willie Walker, male   DOB: 20-Feb-1965, 46 y.o.   MRN: [id number] 46 yo referred by health server for chest pain.  Last seen here in 2010 for atypical pain with normal stress echo History of diabetes A1c on labs 08/05/11 8.1  Reviewed other labs and cholesterol 93 baseline Cr 1.5  Has a card from [facility] in [place] from 10/2005 indicating Cypher stent to OM, Cypher stent to Mid circumflex and 6 cypher stents to RCA Having SSCP.  Similar to previous pain but not as bad.  Interestingly this history was not given in 2010 when seen.  Compliant with meds for DM and no resting pain  ROS: Denies fever, malais, weight loss, blurry vision, decreased visual acuity, cough, sputum, SOB, hemoptysis, pleuritic pain, palpitaitons, heartburn, abdominal pain, melena, lower extremity edema, claudication, or rash.  All other systems reviewed and negative   General: Affect appropriate Healthy:  appears stated age HEENT: normal Neck supple with no adenopathy JVP normal no bruits no thyromegaly Lungs clear with no wheezing and good diaphragmatic motion Heart:  S1/S2 no murmur,rub, gallop or click PMI normal Abdomen: benighn, BS positve, no tenderness, no AAA no bruit.  No HSM or HJR Distal pulses intact with no bruits No edema Neuro non-focal Skin warm and dry No muscular weakness  Medications Current Outpatient Prescriptions  Medication Sig Dispense Refill  . colchicine 0.6 MG tablet Take 0.6 mg by mouth daily.      . indomethacin (INDOCIN) 50 MG capsule Take 50 mg by mouth 2 (two) times daily with a meal.        Allergies Review of patient's allergies indicates not on file.  Family History: No family history on file.  Social History: History   Social History  . Marital Status: Single    Spouse Name: N/A    Number of Children: N/A  . Years of Education: N/A   Occupational History  . Not on file.   Social History Main Topics  . Smoking status: Not on file  . Smokeless  tobacco: Not on file  . Alcohol Use: Not on file  . Drug Use: Not on file  . Sexually Active: Not on file   Other Topics Concern  . Not on file   Social History Narrative  . No narrative on file    Electrocardiogram:  NSR rate 69  LVH and T wave inversion 3,F  Assessment and Plan

## 2011-09-17 NOTE — Interval H&P Note (Signed)
History and Physical Interval Note:  09/17/2011 11:53 AM  Willie Walker  has presented today for surgery, with the diagnosis of chest pain  The various methods of treatment have been discussed with the patient and family. After consideration of risks, benefits and other options for treatment, the patient has consented to  Procedure(s) (LRB): JV LEFT HEART CATHETERIZATION WITH CORONARY ANGIOGRAM (N/A) as a surgical intervention .  The patient's history has been reviewed, patient examined, no change in status, stable for surgery.  I have reviewed the patient's chart and labs.  Questions were answered to the patient's satisfaction.     Charlton Haws

## 2011-09-17 NOTE — OR Nursing (Signed)
Positive Allen's test right hand 

## 2011-09-17 NOTE — Progress Notes (Signed)
TR band removed, gauze and tegaderm dressing applied and report called to 3-west by Scarlette Calico.  Right radial site level 0.  Transported to 3-west room 11.

## 2011-09-18 ENCOUNTER — Encounter (HOSPITAL_COMMUNITY)
Admission: AD | Disposition: A | Discharge status: Home / Self Care | Payer: Self-pay | Source: Ambulatory Visit | Attending: Cardiovascular Disease

## 2011-09-18 ENCOUNTER — Ambulatory Visit (HOSPITAL_COMMUNITY): Admit: 2011-09-18 | Payer: MEDICAID | Admitting: Cardiology

## 2011-09-18 ENCOUNTER — Encounter (HOSPITAL_COMMUNITY): Payer: Self-pay | Admitting: General Practice

## 2011-09-18 DIAGNOSIS — K922 Gastrointestinal hemorrhage, unspecified: Secondary | ICD-10-CM

## 2011-09-18 DIAGNOSIS — R51 Headache: Secondary | ICD-10-CM

## 2011-09-18 DIAGNOSIS — R0602 Shortness of breath: Secondary | ICD-10-CM

## 2011-09-18 DIAGNOSIS — I2510 Atherosclerotic heart disease of native coronary artery without angina pectoris: Secondary | ICD-10-CM

## 2011-09-18 DIAGNOSIS — R569 Unspecified convulsions: Secondary | ICD-10-CM

## 2011-09-18 HISTORY — DX: Headache: R51

## 2011-09-18 HISTORY — DX: Shortness of breath: R06.02

## 2011-09-18 HISTORY — PX: PERCUTANEOUS CORONARY STENT INTERVENTION (PCI-S): SHX5485

## 2011-09-18 HISTORY — PX: CORONARY ANGIOPLASTY: SHX604

## 2011-09-18 HISTORY — DX: Unspecified convulsions: R56.9

## 2011-09-18 HISTORY — DX: Gastrointestinal hemorrhage, unspecified: K92.2

## 2011-09-18 LAB — COMPREHENSIVE METABOLIC PANEL
AST: 53 U/L — ABNORMAL HIGH (ref 0–37)
Albumin: 3.3 g/dL — ABNORMAL LOW (ref 3.5–5.2)
Calcium: 8.8 mg/dL (ref 8.4–10.5)
Creatinine, Ser: 1.47 mg/dL — ABNORMAL HIGH (ref 0.50–1.35)
Total Protein: 7.1 g/dL (ref 6.0–8.3)

## 2011-09-18 LAB — CBC
HCT: 37.6 % — ABNORMAL LOW (ref 39.0–52.0)
MCH: 28 pg (ref 26.0–34.0)
MCHC: 32.7 g/dL (ref 30.0–36.0)
MCV: 85.5 fL (ref 78.0–100.0)
RDW: 15.7 % — ABNORMAL HIGH (ref 11.5–15.5)

## 2011-09-18 LAB — GLUCOSE, CAPILLARY: Glucose-Capillary: 144 mg/dL — ABNORMAL HIGH (ref 70–99)

## 2011-09-18 LAB — HEPARIN LEVEL (UNFRACTIONATED): Heparin Unfractionated: 0.42 IU/mL (ref 0.30–0.70)

## 2011-09-18 SURGERY — PERCUTANEOUS CORONARY STENT INTERVENTION (PCI-S)
Anesthesia: LOCAL

## 2011-09-18 MED ORDER — ALUM & MAG HYDROXIDE-SIMETH 200-200-20 MG/5ML PO SUSP
30.0000 mL | ORAL | Status: DC | PRN
  Administered 2011-09-18: 22:00:00 30 mL via ORAL
  Filled 2011-09-18: qty 30

## 2011-09-18 MED ORDER — SODIUM CHLORIDE 0.9 % IV SOLN
INTRAVENOUS | Status: AC
  Administered 2011-09-18: 15:00:00 via INTRAVENOUS

## 2011-09-18 MED ORDER — HEPARIN (PORCINE) IN NACL 2-0.9 UNIT/ML-% IJ SOLN
INTRAMUSCULAR | Status: AC
  Filled 2011-09-18: qty 1000

## 2011-09-18 MED ORDER — NITROGLYCERIN 0.2 MG/ML ON CALL CATH LAB
INTRAVENOUS | Status: AC
  Filled 2011-09-18: qty 1

## 2011-09-18 MED ORDER — VERAPAMIL HCL 2.5 MG/ML IV SOLN
INTRAVENOUS | Status: AC
  Filled 2011-09-18: qty 2

## 2011-09-18 MED ORDER — ACETAMINOPHEN 325 MG PO TABS: 650.0000 mg | ORAL_TABLET | ORAL | Status: DC | PRN

## 2011-09-18 MED ORDER — BIVALIRUDIN 250 MG IV SOLR
INTRAVENOUS | Status: AC
  Filled 2011-09-18: qty 250

## 2011-09-18 MED ORDER — FENTANYL CITRATE 0.05 MG/ML IJ SOLN
INTRAMUSCULAR | Status: AC
  Filled 2011-09-18: qty 2

## 2011-09-18 MED ORDER — ONDANSETRON HCL 4 MG/2ML IJ SOLN: 4.0000 mg | Freq: Four times a day (QID) | INTRAMUSCULAR | Status: DC | PRN

## 2011-09-18 MED ORDER — MIDAZOLAM HCL 2 MG/2ML IJ SOLN
INTRAMUSCULAR | Status: AC
  Filled 2011-09-18: qty 2

## 2011-09-18 MED ORDER — LIDOCAINE HCL (PF) 1 % IJ SOLN
INTRAMUSCULAR | Status: AC
  Filled 2011-09-18: qty 30

## 2011-09-18 NOTE — Progress Notes (Signed)
ANTICOAGULATION CONSULT NOTE - Follow Up Consult  Pharmacy Consult for heparin Indication: awaiting PCI  Labs:  Basename 09/18/11 1138 09/18/11 0630  HGB -- 12.3*  HCT -- 37.6*  PLT -- 258  APTT -- --  LABPROT -- --  INR -- --  HEPARINUNFRC 0.42 0.32  CREATININE -- 1.47*  CKTOTAL -- --  CKMB -- --  TROPONINI -- --    Assessment/Plan:  46yo male therapeutic on heparin with initial dosing post-cath, now leaving floor for PCI.  Heparin drip stopped. Will f/u after PCI.   Marylouise Stacks PharmD BCPS 09/18/2011,1:05 PM

## 2011-09-18 NOTE — Interval H&P Note (Signed)
History and Physical Interval Note:  09/18/2011 1:14 PM  Willie Walker  has presented today for surgery, with the diagnosis of cad  The various methods of treatment have been discussed with the patient and family. After consideration of risks, benefits and other options for treatment, the patient has consented to  Procedure(s) (LRB): PERCUTANEOUS CORONARY STENT INTERVENTION (PCI-S) (N/A) as a surgical intervention .  The patient's history has been reviewed, patient examined, no change in status, stable for surgery.  I have reviewed the patient's chart and labs.  Questions were answered to the patient's satisfaction.     Theron Arista [facility] 09/18/2011 1:14 PM

## 2011-09-18 NOTE — Progress Notes (Signed)
TR BAND REMOVAL  LOCATION:  right radial  DEFLATED PER PROTOCOL:  yes  TIME BAND OFF / DRESSING APPLIED: 1800     SITE UPON ARRIVAL:   Level 0  SITE AFTER BAND REMOVAL:  Level 0  REVERSE ALLEN'S TEST:    positive  CIRCULATION SENSATION AND MOVEMENT:  Within Normal Limits  yes  COMMENTS:     

## 2011-09-18 NOTE — Progress Notes (Signed)
ANTICOAGULATION CONSULT NOTE - Follow Up Consult  Pharmacy Consult for heparin Indication: awaiting PCI  Labs:  Basename 09/18/11 0630  HGB 12.3*  HCT 37.6*  PLT 258  APTT --  LABPROT --  INR --  HEPARINUNFRC 0.32  CREATININE --  CKTOTAL --  CKMB --  TROPONINI --    Assessment/Plan:  46yo male therapeutic on heparin with initial dosing post-cath, now awaiting PCI scheduled for this afternoon.  Will continue gtt at current rate and confirm stable with additional level.  Colleen Can PharmD BCPS 09/18/2011,7:28 AM

## 2011-09-18 NOTE — Care Management Note (Unsigned)
    Page 1 of 1   09/18/2011     11:13:29 AM   CARE MANAGEMENT NOTE 09/18/2011  Patient:  Willie Walker, Willie Walker   Account Number:  [id number]  Date Initiated:  09/18/2011  Documentation initiated by:  GRAVES-BIGELOW,Arelie Kuzel  Subjective/Objective Assessment:   Pt admitted with cp. Post cath plan for PCI today. Pt is from home with wife and two children. 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     Action/Plan:   CM did speak to pt and made him aware that simvistatin can be purchased at [facility] along with plavix at cheaper cost. CM called [facility] to assist with bill and question possible medicaid. P states working on disability.   Anticipated DC Date:  09/19/2011   Anticipated DC Plan:  HOME/SELF CARE  In-house referral  Financial Counselor      DC Planning Services  CM consult  Medication Assistance  Indigent Health Clinic      Choice offered to / List presented to:             Status of service:  In process, will continue to follow Medicare Important Message given?   (If response is "NO", the following Medicare IM given date fields will be blank) Date Medicare IM given:   Date Additional Medicare IM given:    Discharge Disposition:    Per UR Regulation:  Reviewed for med. necessity/level of care/duration of stay  If discussed at Long Length of Stay Meetings, dates discussed:    Comments:  09-18-11 [address] [place] [phone] CM did call  main pharmacy to see if pt is eligible for 3 day supply of medications and he is. MD please write Rx for medicaitons 3 day supply no controlled subtances please. Weekend CM can assist with the medications. CM will provide pt with resources for [facility]. CM did call the [facility] to see if they had any openings for new pt's. They have a waiting list and pt was placed on this list and they will contact pt as time gets closer to October. CM will continue to f/u.

## 2011-09-18 NOTE — Progress Notes (Signed)
UR Completed Serenna Deroy Graves-Bigelow, RN,BSN [phone]  

## 2011-09-18 NOTE — H&P (View-Only) (Signed)
Patient ID: Willie Walker, male   DOB: 12/12/1965, 46 y.o.   MRN: [id number] 46 yo referred by health server for chest pain.  Last seen here in 2010 for atypical pain with normal stress echo History of diabetes A1c on labs 08/05/11 8.1  Reviewed other labs and cholesterol 93 baseline Cr 1.5  Has a card from [facility] in [place] from 10/2005 indicating Cypher stent to OM, Cypher stent to Mid circumflex and 6 cypher stents to RCA Having SSCP.  Similar to previous pain but not as bad.  Interestingly this history was not given in 2010 when seen.  Compliant with meds for DM and no resting pain  ROS: Denies fever, malais, weight loss, blurry vision, decreased visual acuity, cough, sputum, SOB, hemoptysis, pleuritic pain, palpitaitons, heartburn, abdominal pain, melena, lower extremity edema, claudication, or rash.  All other systems reviewed and negative   General: Affect appropriate Healthy:  appears stated age HEENT: normal Neck supple with no adenopathy JVP normal no bruits no thyromegaly Lungs clear with no wheezing and good diaphragmatic motion Heart:  S1/S2 no murmur,rub, gallop or click PMI normal Abdomen: benighn, BS positve, no tenderness, no AAA no bruit.  No HSM or HJR Distal pulses intact with no bruits No edema Neuro non-focal Skin warm and dry No muscular weakness  Medications Current Outpatient Prescriptions  Medication Sig Dispense Refill  . colchicine 0.6 MG tablet Take 0.6 mg by mouth daily.      . indomethacin (INDOCIN) 50 MG capsule Take 50 mg by mouth 2 (two) times daily with a meal.        Allergies Review of patient's allergies indicates not on file.  Family History: No family history on file.  Social History: History   Social History  . Marital Status: Single    Spouse Name: N/A    Number of Children: N/A  . Years of Education: N/A   Occupational History  . Not on file.   Social History Main Topics  . Smoking status: Not on file  . Smokeless  tobacco: Not on file  . Alcohol Use: Not on file  . Drug Use: Not on file  . Sexually Active: Not on file   Other Topics Concern  . Not on file   Social History Narrative  . No narrative on file    Electrocardiogram:  NSR rate 69  LVH and T wave inversion 3,F  Assessment and Plan   

## 2011-09-18 NOTE — CV Procedure (Signed)
   CARDIAC CATH NOTE  Name: Willie Walker MRN: [id number] DOB: 1965/10/06  Procedure: PTCA of the mid RCA and of the mid circumflex for in-stent restenosis.  Indication: 46 year old black male with history of coronary disease. He has had extensive stenting of the right coronary in the past as well as stenting of the left circumflex and a ramus branch. He presents with increasing anginal symptoms. Cardiac catheterization demonstrated somewhat diffuse coronary disease but there was a severe focal in-stent restenosis in the mid RCA and in the mid left circumflex. These are felt to be amenable to percutaneous intervention.  Procedural Details: The right wrist was prepped, draped, and anesthetized with 1% lidocaine. Using the modified Seldinger technique, a 6 Fr sheath was introduced into the radial artery. 3 mg verapamil was administered through the radial sheath. Weight-based bivalirudin was given for anticoagulation. Once a therapeutic ACT was achieved, we initially treated the RCA stenosis. A 6 [place] Ikari right 1 guide catheter was inserted.  An Asahi medium coronary guidewire was used to cross the lesion.  The lesion was predilated with a 2.5 mm balloon.  The lesion was then treated with a 3.0 x 10 mm cutting balloon up to 8 atmospheres.    Following PCI, there was 0% residual stenosis and TIMI-3 flow. Final angiography confirmed an excellent result.   We next treated the left circumflex. We switched to a 6 [place] left Voda 3.5 guide. The same Asahi medium wire was used to cross the lesion. The lesion was predilated with the same 2.5 mm balloon. We then used the same 3.0 x 10 mm cutting balloon to dilate the lesion up to 8 atmospheres. Following PCI, there was 0% residual stenosis and TIMI grade 3 flow. Final angiography confirmed an excellent result. The patient tolerated the procedure well. There were no immediate procedural complications. A TR band was used for radial hemostasis. The patient was  transferred to the post catheterization recovery area for further monitoring.  Lesion Data: Vessel: Mid RCA Percent stenosis (pre): 90% TIMI-flow (pre):  3 PCI: 3.0 mm cutting balloon Percent stenosis (post): 0% TIMI-flow (post): 3  Vessel: Mid LCx Percent stenoses (pre): 95% TIMI flow (pre): 3 PCI: 3.0 mm cutting balloon Percent stenoses (post): 0% TIMI flow (post): 3  Conclusions:  Successful PCI with cutting balloon angioplasty of the mid RCA and the mid left circumflex for in-stent restenosis.  Recommendations: Continue dual antiplatelet therapy for one year  Judi Cong 09/18/2011, 2:34 PM

## 2011-09-19 DIAGNOSIS — E119 Type 2 diabetes mellitus without complications: Secondary | ICD-10-CM

## 2011-09-19 DIAGNOSIS — E782 Mixed hyperlipidemia: Secondary | ICD-10-CM

## 2011-09-19 DIAGNOSIS — I200 Unstable angina: Secondary | ICD-10-CM

## 2011-09-19 DIAGNOSIS — I10 Essential (primary) hypertension: Secondary | ICD-10-CM

## 2011-09-19 LAB — GLUCOSE, CAPILLARY: Glucose-Capillary: 196 mg/dL — ABNORMAL HIGH (ref 70–99)

## 2011-09-19 LAB — BASIC METABOLIC PANEL
Calcium: 9.5 mg/dL (ref 8.4–10.5)
GFR calc non Af Amer: 62 mL/min — ABNORMAL LOW (ref >90)
Glucose, Bld: 192 mg/dL — ABNORMAL HIGH (ref 70–99)
Sodium: 136 mEq/L (ref 135–145)

## 2011-09-19 LAB — CBC
Hemoglobin: 12.5 g/dL — ABNORMAL LOW (ref 13.0–17.0)
MCH: 27.7 pg (ref 26.0–34.0)
MCHC: 32.6 g/dL (ref 30.0–36.0)

## 2011-09-19 MED ORDER — NITROGLYCERIN 0.4 MG SL SUBL: 0.4000 mg | SUBLINGUAL_TABLET | SUBLINGUAL | Status: DC | PRN

## 2011-09-19 MED ORDER — CLOPIDOGREL BISULFATE 75 MG PO TABS: 75.0000 mg | ORAL_TABLET | Freq: Every day | ORAL | Status: DC

## 2011-09-19 NOTE — Discharge Summary (Signed)
Patient seen and examined and history reviewed. Agree with above findings and plan. See rounding note earlier today.  Theron Arista [facility] 09/19/2011 12:07 PM

## 2011-09-19 NOTE — Discharge Summary (Signed)
Physician Discharge Summary  Patient ID: Willie Walker MRN: [id number] DOB/AGE: 02/25/1965 46 y.o.  Admit date: 09/17/2011 Discharge date: 09/19/2011  Primary Cardiologist: Charlton Haws, MD   Primary Discharge Diagnosis: 1 CAD  - PCI with "cutting balloon" angioplasty of the mid RCA and the mid left circumflex for in-stent restenosis.  - NL LVF (EF 60%%), No RWMAs   Secondary Discharge Diagnoses: Past Medical History  Diagnosis Date  . ELECTROCARDIOGRAM, ABNORMAL   . Coronary artery disease   . Anginal pain   . Hypertension   . GERD (gastroesophageal reflux disease)   . High cholesterol   . Peripheral vascular disease     LLE  . Myocardial infarction 10/2005  . Asthma     "had it; outgrew it"  . Pneumonia 1990's  . Shortness of breath 09/18/11    "@ rest; lying down; w/exertion"  . Type I diabetes mellitus   . Lower GI bleed 09/18/2011    "clots and everything; not lately"  . Headache 09/18/2011    "maybe twice/wk; pressure on the brain"  . Seizures 09/18/2011    "blanks out on me"/wife's report  . Chronic lower back pain   . Gout   . Anxiety      Procedures: PTCA of the mid RCA and of the mid circumflex for in-stent restenosis   Cardiac Catheterization, 09/17/11: Coronary Arteries:  Right dominant with no anomalies  LM: 30% distal   LAD: proximal- normal, mid-60% diffuse, distal-40% multiple discrete IM- Large vessel 30% instent restenosis D1- 40-50% multiple discrete lesions  Circumflex: Single OM/AV groove branch 95% instent restenosis  RCA: Likely previously disected during attempted PCI with full metal jacket 90% proximal instent restonosis. Diffuse 80% instent restenosis distally extending into PDA  And large RV branch  Ventriculography: EF: 60%%, No RWMA;s  Hemodynamics:  Aortic Pressure: 100/63 mmHg LV Pressure: 100/ 16 mmHg  Impression: Discussed case with Dr Excell Seltzer and [place]. Best approach would be to try to fix proximal RCA and OM and then Rx  medically. Distal RCA not likely  Salvageable. Admit heparin and load with Plavix.  At the end of the case a TR band was placed with good hemostasis and flow to the hand including the radial aspect of the thumb.   Hospital Course: 46 year old male, with known CAD, admitted for elective diagnostic coronary angiography for evaluation of chest pain. Catheterization results as outlined above. Patient returned the following day for successful PCI with cutting balloon angioplasty of the mid RCA and the mid left circumflex for in-stent restenosis.  Patient was cleared for discharge the following morning, in hemodynamically stable condition. There were no noted complications of the catheterization incision site.  Recommendation is to continue dual antiplatelet therapy for at least one year.    Discharge Vitals: Blood pressure 118/81, pulse 70, temperature 98.2 F (36.8 C), temperature source Oral, resp. rate 12, height 5\' 11"  (1.803 m), weight 191 lb 12.8 oz (87 kg), SpO2 100.00%.  Labs: Lab Results  Component Value Date   WBC 7.2 09/19/2011   HGB 12.5* 09/19/2011   HCT 38.4* 09/19/2011   MCV 85.1 09/19/2011   PLT 268 09/19/2011      Lab 09/19/11 0520 09/18/11 0630  NA 136 --  K 4.4 --  CL 103 --  CO2 26 --  BUN 19 --  CREATININE 1.32 --  CALCIUM 9.5 --  ALBUMIN -- 3.3*  PROT -- 7.1  BILITOT -- 0.2*  ALKPHOS -- 100  ALT -- 54*  AST --  53*  GLUCOSE 192* --    Lab Results  Component Value Date   CHOL 167 01/22/2009   HDL 49 01/22/2009   LDLCALC 98 01/22/2009   TRIG 102 01/22/2009    No results found for this basename: DDIMER    Lab Results  Component Value Date   TSH 1.983 04/19/2007    No results found for this basename: CKTOTAL:3,CKMB:3,TROPONINI:3 in the last 72 hours  Diagnostic Studies: No results found.   DISPOSITION: Stable condition  FOLLOW UP PLANS AND APPOINTMENTS: Discharge Orders    Future Orders Please Complete By Expires   Amb Referral to Cardiac  Rehabilitation          DISCHARGE MEDICATIONS: Medication List  As of 09/19/2011  8:49 AM   TAKE these medications         amitriptyline 25 MG tablet   Commonly known as: ELAVIL   Take 25 mg by mouth at bedtime.      aspirin 81 MG tablet   Take 81 mg by mouth daily.      clopidogrel 75 MG tablet   Commonly known as: PLAVIX   Take 1 tablet (75 mg total) by mouth daily with breakfast.      fish oil-omega-3 fatty acids 1000 MG capsule   Take 1 g by mouth 2 (two) times daily.      lisinopril 10 MG tablet   Commonly known as: PRINIVIL,ZESTRIL   Take 10 mg by mouth daily.      metFORMIN 500 MG tablet   Commonly known as: GLUCOPHAGE   Take 500 mg by mouth 2 (two) times daily with a meal.      metoprolol succinate 50 MG 24 hr tablet   Commonly known as: TOPROL-XL   Take 50 mg by mouth daily. Take with or immediately following a meal.      nitroGLYCERIN 0.4 MG SL tablet   Commonly known as: NITROSTAT   Place 1 tablet (0.4 mg total) under the tongue every 5 (five) minutes x 3 doses as needed for chest pain.      simvastatin 40 MG tablet   Commonly known as: ZOCOR   Take 40 mg by mouth every morning.            BRING ALL MEDICATIONS WITH YOU TO FOLLOW UP APPOINTMENTS  Time spent with patient to include physician time: Greater than 30 minutes, including physician time.  SignedPrescott Parma 09/19/2011, 8:49 AM Co-Sign MD

## 2011-09-19 NOTE — Progress Notes (Signed)
CARDIAC REHAB PHASE I   PRE:  Rate/Rhythm: 67  BP:  Supine: 118/81  Sitting:   Standing:    SaO2: 98  MODE:  Ambulation: 300 ft   POST:  Rate/Rhythem: 75  BP:  Supine:   Sitting: 126/74  Standing:    SaO2:  7:45 am   Patient ambulated with assist x 1, denies CP, SOB.  Pace good gait steady.  Tolerated well.  Patient education completed.      Jackey Loge

## 2011-09-19 NOTE — Progress Notes (Signed)
   TELEMETRY: Reviewed telemetry pt in NSR: Filed Vitals:   09/18/11 2342 09/19/11 0000 09/19/11 0510 09/19/11 0700  BP: 118/77  130/89 118/81  Pulse: 69 71 76 70  Temp: 98.3 F (36.8 C)  98.1 F (36.7 C) 98.2 F (36.8 C)  TempSrc: Oral   Oral  Resp: 15 15 16 12   Height:   5\' 11"  (1.803 m)   Weight:   87 kg (191 lb 12.8 oz)   SpO2: 100% 100% 100% 100%    Intake/Output Summary (Last 24 hours) at 09/19/11 2956 Last data filed at 09/19/11 0500  Gross per 24 hour  Intake    825 ml  Output    650 ml  Net    175 ml    SUBJECTIVE Had some mild chest pain yesterday and this am. No pain with ambulation. Overall feels his chest pain is much better.   LABS: Basic Metabolic Panel:  Basename 09/19/11 0520 09/18/11 0630  NA 136 136  K 4.4 4.5  CL 103 102  CO2 26 24  GLUCOSE 192* 218*  BUN 19 19  CREATININE 1.32 1.47*  CALCIUM 9.5 8.8  MG -- --  PHOS -- --   Liver Function Tests:  Basename 09/18/11 0630  AST 53*  ALT 54*  ALKPHOS 100  BILITOT 0.2*  PROT 7.1  ALBUMIN 3.3*   CBC:  Basename 09/19/11 0520 09/18/11 0630  WBC 7.2 6.8  NEUTROABS -- --  HGB 12.5* 12.3*  HCT 38.4* 37.6*  MCV 85.1 85.5  PLT 268 258   Radiology/Studies:  No results found.  PHYSICAL EXAM General: Well developed, well nourished, in no acute distress. Head: Normocephalic, atraumatic, sclera non-icteric, no xanthomas, nares are without discharge. Neck: Negative for carotid bruits. JVD not elevated. Lungs: Clear bilaterally to auscultation without wheezes, rales, or rhonchi. Breathing is unlabored. Heart: RRR S1 S2 without murmurs, rubs, or gallops.  Abdomen: Soft, non-tender, non-distended with normoactive bowel sounds. No hepatomegaly. No rebound/guarding. No obvious abdominal masses. Msk:  Strength and tone appears normal for age. Extremities: No clubbing, cyanosis or edema.  Distal pedal pulses are 2+ and equal bilaterally. No radial site hematoma. Neuro: Alert and oriented X 3. Moves  all extremities spontaneously. Psych:  Responds to questions appropriately with a normal affect.  ASSESSMENT AND PLAN: 1. Unstable angina. S/p cutting balloon PCI of LCX and RCA for instent restenosis. Fairly diffuse CAD. Continue ASA and Plavix. On metoprolol. Prn nitrates. Will discharge today. Follow up with Dr. Eden Emms.   2. DM type 2- A1C 8.1%. Resume Metformin 2 days post PCI. Needs medical follow up. [facility]. On waiting list to be seen in Medicine outpatient clinic.  3. Hyperlipidemia on Zocor.   4. HTN on ACEi and beta blocker.  Principal Problem:  *Unstable angina Active Problems:  Diabetes mellitus type II  Mixed hyperlipidemia  HTN (hypertension)    Signed, Sharai Overbay [place] MD,FACC 09/19/2011 8:38 AM

## 2011-09-21 MED FILL — Dextrose Inj 5%: INTRAVENOUS | Qty: 50 | Status: AC

## 2011-10-07 ENCOUNTER — Encounter: Payer: Self-pay | Admitting: Cardiovascular Disease

## 2011-10-07 ENCOUNTER — Ambulatory Visit (INDEPENDENT_AMBULATORY_CARE_PROVIDER_SITE_OTHER): Payer: Self-pay | Admitting: Cardiovascular Disease

## 2011-10-07 VITALS — BP 134/83 | HR 70 | Ht 71.0 in | Wt 197.0 lb

## 2011-10-07 DIAGNOSIS — E782 Mixed hyperlipidemia: Secondary | ICD-10-CM

## 2011-10-07 DIAGNOSIS — I10 Essential (primary) hypertension: Secondary | ICD-10-CM

## 2011-10-07 DIAGNOSIS — I200 Unstable angina: Secondary | ICD-10-CM

## 2011-10-07 DIAGNOSIS — E119 Type 2 diabetes mellitus without complications: Secondary | ICD-10-CM

## 2011-10-07 NOTE — Progress Notes (Signed)
Patient ID: Willie Walker, male   DOB: Oct 07, 1965, 46 y.o.   MRN: [id number] 46 yo with CAD  Multiple stents placed in [place] with full metal jacket to RCA and stents in IM and circuflex  Cath 8/29  Cardiac Catheterization, 09/17/11:  Coronary Arteries:  Right dominant with no anomalies  LM: 30% distal   LAD: proximal- normal, mid-60% diffuse, distal-40% multiple discrete IM- Large vessel 30% instent restenosis D1- 40-50% multiple discrete lesions  Circumflex: Single OM/AV groove branch 95% instent restenosis  RCA: Likely previously disected during attempted PCI with full metal jacket 90% proximal instent restonosis. Diffuse 80% instent restenosis distally extending into PDA  And large RV branch  Ventriculography: EF: 60%%, No RWMA;s   Had cutting balloon PCI with Dr [place] to proximal and mid RCA and circumflex.  Lesion Data:  Vessel: Mid RCA  Percent stenosis (pre): 90%  TIMI-flow (pre): 3  PCI: 3.0 mm cutting balloon  Percent stenosis (post): 0%  TIMI-flow (post): 3  Vessel: Mid LCx  Percent stenoses (pre): 95%  TIMI flow (pre): 3  PCI: 3.0 mm cutting balloon  Percent stenoses (post): 0%  TIMI flow (post): 3   Since D/C no chest pain.  Active and compliant with meds  Willing to be in Accelerate trial  ROS: Denies fever, malais, weight loss, blurry vision, decreased visual acuity, cough, sputum, SOB, hemoptysis, pleuritic pain, palpitaitons, heartburn, abdominal pain, melena, lower extremity edema, claudication, or rash.  All other systems reviewed and negative  General: Affect appropriate Healthy:  appears stated age HEENT: normal Neck supple with no adenopathy JVP normal no bruits no thyromegaly Lungs clear with no wheezing and good diaphragmatic motion Heart:  S1/S2 no murmur, no rub, gallop or click PMI normal Abdomen: benighn, BS positve, no tenderness, no AAA no bruit.  No HSM or HJR Distal pulses intact with no bruits No edema Neuro non-focal Skin warm and  dry No muscular weakness   Current Outpatient Prescriptions  Medication Sig Dispense Refill  . amitriptyline (ELAVIL) 25 MG tablet Take 25 mg by mouth at bedtime.      Marland Kitchen aspirin 81 MG tablet Take 81 mg by mouth daily.      . clopidogrel (PLAVIX) 75 MG tablet Take 1 tablet (75 mg total) by mouth daily with breakfast.  30 tablet  6  . fish oil-omega-3 fatty acids 1000 MG capsule Take 1 g by mouth 2 (two) times daily.      Marland Kitchen lisinopril (PRINIVIL,ZESTRIL) 10 MG tablet Take 10 mg by mouth daily.      . metFORMIN (GLUCOPHAGE) 500 MG tablet Take 500 mg by mouth 2 (two) times daily with a meal.      . metoprolol succinate (TOPROL-XL) 50 MG 24 hr tablet Take 50 mg by mouth daily. Take with or immediately following a meal.      . nitroGLYCERIN (NITROSTAT) 0.4 MG SL tablet Place 1 tablet (0.4 mg total) under the tongue every 5 (five) minutes x 3 doses as needed for chest pain.  25 tablet  1  . simvastatin (ZOCOR) 40 MG tablet Take 40 mg by mouth every morning.        Allergies  Crestor  Electrocardiogram:  Assessment and Plan

## 2011-10-07 NOTE — Assessment & Plan Note (Addendum)
Cholesterol is at goal.  Continue current dose of statin and diet Rx.  No myalgias or side effects.  F/U  LFT's in 6 months. Lab Results  Component Value Date   LDLCALC 98 01/22/2009   Discussed with Willie Walker and pateint will see if he can be enrolled in Accelerate Trial

## 2011-10-07 NOTE — Assessment & Plan Note (Signed)
Discussed low carb diet.  Target hemoglobin A1c is 6.5 or less.  Continue current medications.  

## 2011-10-07 NOTE — Patient Instructions (Addendum)
Your physician recommends that you schedule a follow-up appointment in: 3 months.  

## 2011-10-07 NOTE — Assessment & Plan Note (Signed)
S/P cutting balloon PCI of instent restenosis of RCA and OM patient stent in IM  Continue medical Rx

## 2011-10-07 NOTE — Assessment & Plan Note (Signed)
Well controlled.  Continue current medications and low sodium Dash type diet.    

## 2011-12-04 ENCOUNTER — Other Ambulatory Visit: Payer: Self-pay | Admitting: *Deleted

## 2011-12-04 MED ORDER — METOPROLOL SUCCINATE ER 50 MG PO TB24: 50.0000 mg | ORAL_TABLET | Freq: Every day | ORAL | Status: DC

## 2011-12-04 MED ORDER — LISINOPRIL 10 MG PO TABS: 10.0000 mg | ORAL_TABLET | Freq: Every day | ORAL | Status: DC

## 2011-12-09 ENCOUNTER — Telehealth: Payer: Self-pay | Admitting: Cardiovascular Disease

## 2011-12-09 NOTE — Telephone Encounter (Signed)
PT HAS FORM THAT NEEDS FILLED OUT FOR DENTIST TO DO CLEANING AND FILLINGS  PT TO DROP OFF  AND WILL FILL OUT AND FAX ONCE COMPLETED./CY

## 2011-12-09 NOTE — Telephone Encounter (Signed)
plz return call to pt [phone] regarding questions about dental work.

## 2011-12-11 ENCOUNTER — Telehealth: Payer: Self-pay | Admitting: Cardiovascular Disease

## 2011-12-11 NOTE — Telephone Encounter (Signed)
Walk In pt Form " Willie Walker,DDS" Dentist paper Dropped off By pt  Needs to be completed by Dr.Nishan placed in his Doc Box, he's back in Office Monday  12/11/11/Km

## 2011-12-15 NOTE — Telephone Encounter (Signed)
AWARE DR Eden Emms TO REVIEW THIS AFTERNOON./CY

## 2011-12-15 NOTE — Telephone Encounter (Signed)
F/u    Dentist office calling check on the status of form that was fax over.

## 2011-12-19 ENCOUNTER — Emergency Department (INDEPENDENT_AMBULATORY_CARE_PROVIDER_SITE_OTHER)
Admission: EM | Admit: 2011-12-19 | Discharge: 2011-12-19 | Disposition: A | Discharge status: Nursing Home (Includes ICF) | Payer: Medicaid Other | Source: Home / Self Care | Attending: Emergency Medicine | Admitting: Emergency Medicine

## 2011-12-19 ENCOUNTER — Emergency Department (HOSPITAL_COMMUNITY): Payer: Medicaid Other

## 2011-12-19 ENCOUNTER — Encounter (HOSPITAL_COMMUNITY): Payer: Self-pay | Admitting: Vascular Surgery

## 2011-12-19 ENCOUNTER — Encounter (HOSPITAL_COMMUNITY): Payer: Self-pay | Admitting: *Deleted

## 2011-12-19 ENCOUNTER — Emergency Department (HOSPITAL_COMMUNITY)
Admission: EM | Admit: 2011-12-19 | Discharge: 2011-12-19 | Disposition: A | Discharge status: Home / Self Care | Payer: Medicaid Other | Attending: Emergency Medicine | Admitting: Emergency Medicine

## 2011-12-19 DIAGNOSIS — Z79899 Other long term (current) drug therapy: Secondary | ICD-10-CM | POA: Insufficient documentation

## 2011-12-19 DIAGNOSIS — I739 Peripheral vascular disease, unspecified: Secondary | ICD-10-CM | POA: Insufficient documentation

## 2011-12-19 DIAGNOSIS — M62838 Other muscle spasm: Secondary | ICD-10-CM | POA: Insufficient documentation

## 2011-12-19 DIAGNOSIS — Z8701 Personal history of pneumonia (recurrent): Secondary | ICD-10-CM | POA: Insufficient documentation

## 2011-12-19 DIAGNOSIS — R109 Unspecified abdominal pain: Secondary | ICD-10-CM | POA: Insufficient documentation

## 2011-12-19 DIAGNOSIS — I200 Unstable angina: Secondary | ICD-10-CM

## 2011-12-19 DIAGNOSIS — M109 Gout, unspecified: Secondary | ICD-10-CM | POA: Insufficient documentation

## 2011-12-19 DIAGNOSIS — I252 Old myocardial infarction: Secondary | ICD-10-CM | POA: Insufficient documentation

## 2011-12-19 DIAGNOSIS — M545 Low back pain, unspecified: Secondary | ICD-10-CM | POA: Insufficient documentation

## 2011-12-19 DIAGNOSIS — Z8669 Personal history of other diseases of the nervous system and sense organs: Secondary | ICD-10-CM | POA: Insufficient documentation

## 2011-12-19 DIAGNOSIS — Z87891 Personal history of nicotine dependence: Secondary | ICD-10-CM | POA: Insufficient documentation

## 2011-12-19 DIAGNOSIS — Z8709 Personal history of other diseases of the respiratory system: Secondary | ICD-10-CM | POA: Insufficient documentation

## 2011-12-19 DIAGNOSIS — I10 Essential (primary) hypertension: Secondary | ICD-10-CM | POA: Insufficient documentation

## 2011-12-19 DIAGNOSIS — M549 Dorsalgia, unspecified: Secondary | ICD-10-CM | POA: Insufficient documentation

## 2011-12-19 DIAGNOSIS — G8929 Other chronic pain: Secondary | ICD-10-CM | POA: Insufficient documentation

## 2011-12-19 DIAGNOSIS — E109 Type 1 diabetes mellitus without complications: Secondary | ICD-10-CM | POA: Insufficient documentation

## 2011-12-19 DIAGNOSIS — I2510 Atherosclerotic heart disease of native coronary artery without angina pectoris: Secondary | ICD-10-CM | POA: Insufficient documentation

## 2011-12-19 DIAGNOSIS — F411 Generalized anxiety disorder: Secondary | ICD-10-CM | POA: Insufficient documentation

## 2011-12-19 DIAGNOSIS — Z8719 Personal history of other diseases of the digestive system: Secondary | ICD-10-CM | POA: Insufficient documentation

## 2011-12-19 DIAGNOSIS — J45909 Unspecified asthma, uncomplicated: Secondary | ICD-10-CM | POA: Insufficient documentation

## 2011-12-19 LAB — BASIC METABOLIC PANEL
BUN: 17 mg/dL (ref 6–23)
CO2: 21 mEq/L (ref 19–32)
Calcium: 9.6 mg/dL (ref 8.4–10.5)
Creatinine, Ser: 1.27 mg/dL (ref 0.50–1.35)

## 2011-12-19 LAB — CBC
HCT: 36.6 % — ABNORMAL LOW (ref 39.0–52.0)
MCH: 27.2 pg (ref 26.0–34.0)
MCV: 84.3 fL (ref 78.0–100.0)
Platelets: 223 10*3/uL (ref 150–400)
RBC: 4.34 MIL/uL (ref 4.22–5.81)
RDW: 14.9 % (ref 11.5–15.5)

## 2011-12-19 LAB — PRO B NATRIURETIC PEPTIDE: Pro B Natriuretic peptide (BNP): 32.8 pg/mL (ref 0–125)

## 2011-12-19 MED ORDER — ASPIRIN 325 MG PO TABS
325.0000 mg | ORAL_TABLET | ORAL | Status: AC
  Administered 2011-12-19: 325 mg via ORAL
  Filled 2011-12-19: qty 1

## 2011-12-19 MED ORDER — LORAZEPAM 2 MG/ML IJ SOLN
1.0000 mg | Freq: Once | INTRAMUSCULAR | Status: AC
  Administered 2011-12-19: 1 mg via INTRAVENOUS
  Filled 2011-12-19: qty 1

## 2011-12-19 MED ORDER — DIAZEPAM 5 MG PO TABS: 5.0000 mg | ORAL_TABLET | Freq: Four times a day (QID) | ORAL | Status: DC | PRN

## 2011-12-19 MED ORDER — IOHEXOL 350 MG/ML SOLN
100.0000 mL | Freq: Once | INTRAVENOUS | Status: AC | PRN
  Administered 2011-12-19: 100 mL via INTRAVENOUS

## 2011-12-19 NOTE — ED Notes (Signed)
Iv  Ns  tko  20  Angio  r  Hand  1  Att     Site  patent 

## 2011-12-19 NOTE — ED Provider Notes (Signed)
I saw and evaluated the patient, reviewed the resident's note and I agree with the findings and plan.  Patient seen examined and complains of musculoskeletal pain and cramps. Patient does have a story suspicious for possible dissection. Although he has no syncope. His pulses are normal. Will order workup  Toy Baker, MD 12/19/11 2046

## 2011-12-19 NOTE — ED Notes (Signed)
Pt  Placed  On  Cardiac  Monitor  And  Nasal  o2  At  2  l  / min

## 2011-12-19 NOTE — ED Provider Notes (Signed)
Chief Complaint  Patient presents with  . Chest Pain    History of Present Illness:   The patient is a 46 year old male who has a history of coronary artery disease and had 2 stents placed a month ago. He presents tonight with a 3-4 day history of recurring epigastric crampy pain that comes and goes lasting about 10-15 minutes at a time. The pain is rated a 9-10 over 10 in intensity and radiates through to his back and up into his chest. It is worse if he lies flat. It is not worse with meals, exertion, activity, or deep inspiration. It has been associated with some dizziness and nausea, but no diaphoresis or shortness of breath. He denies fever, chills, coughing, or wheezing. He's had no palpitations or syncope. He denies any vomiting but has had some constipation. He has a history of coronary artery disease. He had stents placed in 2007 and redone again about a month ago. Dr.Nishan is his cardiologist. He is on a study drug following his second stent placement.  Review of Systems:  Other than noted above, the patient denies any of the following symptoms. Systemic:  No fever, chills, sweats, or fatigue. ENT:  No nasal congestion, rhinorrhea, or sore throat. Pulmonary:  No cough, wheezing, shortness of breath, sputum production, hemoptysis. Cardiac:  No palpitations, rapid heartbeat, dizziness, presyncope or syncope. GI:  No abdominal pain, heartburn, nausea, or vomiting. Ext:  No leg pain or swelling.  PMFSH:  Past medical history, family history, social history, meds, and allergies were reviewed and updated as needed.   Physical Exam:   Vital signs:  BP 119/77  Pulse 62  Temp 98.5 F (36.9 C) (Oral)  Resp 22  SpO2 100% Gen:  Alert, oriented, in no distress, skin warm and dry. Eye:  PERRL, lids and conjunctivas normal.  Sclera non-icteric. ENT:  Mucous membranes moist, pharynx clear. Neck:  Supple, no adenopathy or tenderness.  No JVD. Lungs:  Clear to auscultation, no wheezes, rales or  rhonchi.  No respiratory distress. Heart:  Regular rhythm.  No gallops, murmers, clicks or rubs. Chest:  No chest wall tenderness. Abdomen:  Soft, nontender, no organomegaly or mass.  Bowel sounds normal.  No pulsatile abdominal mass or bruit. Ext:  No edema.  No calf tenderness and Homann's sign negative.  Pulses full and equal. Skin:  Warm and dry.  No rash.  EKG:   Date: 12/19/2011  Rate: 67  Rhythm: normal sinus rhythm  QRS Axis: normal  Intervals: normal  ST/T Wave abnormalities: normal  Conduction Disutrbances:none  Narrative Interpretation: Normal sinus rhythm, normal EKG.  Old EKG Reviewed: none available   Medications given in [facility]:  He was started on normal saline intravenously, given oxygen by nasal cannula and monitored. He'll be transferred to the emergency department via [facility].  Assessment:  The encounter diagnosis was Unstable angina.  Plan:   1.  The following meds were prescribed:   New Prescriptions   No medications on file   2.  The patient was transferred to the emergency department via [facility] in stable condition.   Reuben Likes, MD 12/19/11 [phone]

## 2011-12-19 NOTE — ED Notes (Signed)
Pt  Reports  Symptoms  Of  Chest     And  epigastric  Pain  X  3  Days       Pt  Reports      Cramps   In  Sides     At  This  Pain        Pain  Scale  Is  6 at  This  Time     -  He  Is  Awake  As  Well  As  Alert and  Oriented         denys  Any  Shortness  Of  Breath         History      Of  Cardiac  Stints     For  6  Years  abd   Had  A  Revision  About  1  Month  Ago

## 2011-12-19 NOTE — ED Notes (Signed)
[facility]  Here   Transported  patient

## 2011-12-19 NOTE — ED Notes (Signed)
Pt arrives to the ED [facility] from [facility]. Pt has had right upper abdominal pain that radiates into his chest x 3 days. 12 lead unremarkable per [facility]. Pt has . Per pt had 2 stents cleaned about a month ago. Pt reports 4/10 cramping pain. Pt denies any aggravating or relieving factor. Denies any associated symptoms such as SOB, N/V, ect. Pt is alert and oriented x 3.

## 2011-12-19 NOTE — ED Provider Notes (Signed)
History     CSN: [id number]  Arrival date & time 12/19/11  1959   First MD Initiated Contact with Patient 12/19/11 2014      Chief Complaint  Patient presents with  . Chest Pain    (Consider location/radiation/quality/duration/timing/severity/associated sxs/prior treatment) Patient is a 46 y.o. male presenting with abdominal pain. The history is provided by the patient.  Abdominal Pain The primary symptoms of the illness include abdominal pain. The primary symptoms of the illness do not include fever, shortness of breath, nausea, vomiting or diarrhea. The current episode started 6 to 12 hours ago. The onset of the illness was gradual. The problem has not changed since onset. The abdominal pain began 6 to 12 hours ago. The pain came on gradually. The abdominal pain has been unchanged since its onset. The abdominal pain is generalized (States crampy pain). The abdominal pain radiates to the back and chest. The severity of the abdominal pain is 6/10. The abdominal pain is relieved by nothing. Exacerbated by: Nothing.  Additional symptoms associated with the illness include back pain. Symptoms associated with the illness do not include chills, heartburn or constipation. Significant associated medical issues include cardiac disease ( history 9 stents).    Past Medical History  Diagnosis Date  . ELECTROCARDIOGRAM, ABNORMAL   . Coronary artery disease   . Anginal pain   . Hypertension   . GERD (gastroesophageal reflux disease)   . High cholesterol   . Peripheral vascular disease     LLE  . Myocardial infarction 10/2005  . Asthma     "had it; outgrew it"  . Pneumonia 1990's  . Shortness of breath 09/18/11    "@ rest; lying down; w/exertion"  . Type I diabetes mellitus   . Lower GI bleed 09/18/2011    "clots and everything; not lately"  . Headache 09/18/2011    "maybe twice/wk; pressure on the brain"  . Seizures 09/18/2011    "blanks out on me"/wife's report  . Chronic lower back  pain   . Gout   . Anxiety     Past Surgical History  Procedure Date  . Coronary angioplasty with stent placement 10/2005    "9"  . Coronary angioplasty 09/18/2011  . Ines Bloomer hole of cranium 1999    "mugged"  . Laceration repair 1999    BLE "muggine"    No family history on file.  History  Substance Use Topics  . Smoking status: Former Smoker -- 0.5 packs/day for 20 years    Types: Cigarettes    Quit date: 11/02/2005  . Smokeless tobacco: Never Used  . Alcohol Use: Yes     Comment: 09/18/2011 "twice a month; 6 shots at a time"      Review of Systems  Constitutional: Negative for fever and chills.  Respiratory: Negative for cough and shortness of breath.   Cardiovascular: Positive for chest pain (radiates from his abdomen).  Gastrointestinal: Positive for abdominal pain. Negative for heartburn, nausea, vomiting, diarrhea and constipation.  Musculoskeletal: Positive for back pain.  All other systems reviewed and are negative.    Allergies  Crestor  Home Medications   Current Outpatient Rx  Name  Route  Sig  Dispense  Refill  . AMITRIPTYLINE HCL 25 MG PO TABS   Oral   Take 25 mg by mouth at bedtime.         . ASPIRIN 81 MG PO TABS   Oral   Take 81 mg by mouth daily.         Marland Kitchen  CLOPIDOGREL BISULFATE 75 MG PO TABS   Oral   Take 1 tablet (75 mg total) by mouth daily with breakfast.   30 tablet   6   . OMEGA-3 FATTY ACIDS 1000 MG PO CAPS   Oral   Take 1 g by mouth 2 (two) times daily.         Marland Kitchen LISINOPRIL 10 MG PO TABS   Oral   Take 1 tablet (10 mg total) by mouth daily.   30 tablet   6   . METFORMIN HCL 500 MG PO TABS   Oral   Take 500 mg by mouth 2 (two) times daily with a meal.         . METOPROLOL SUCCINATE ER 50 MG PO TB24   Oral   Take 1 tablet (50 mg total) by mouth daily. Take with or immediately following a meal.   30 tablet   6   . NITROGLYCERIN 0.4 MG SL SUBL   Sublingual   Place 1 tablet (0.4 mg total) under the tongue every 5  (five) minutes x 3 doses as needed for chest pain.   25 tablet   1   . SIMVASTATIN 40 MG PO TABS   Oral   Take 40 mg by mouth every morning.           BP 134/82  Temp 98.9 F (37.2 C) (Oral)  Resp 16  SpO2 98%  Physical Exam  Nursing note and vitals reviewed. Constitutional: He is oriented to person, place, and time. He appears well-developed and well-nourished. No distress.  HENT:  Head: Normocephalic and atraumatic.  Mouth/Throat: No oropharyngeal exudate.  Eyes: EOM are normal. Pupils are equal, round, and reactive to light.  Neck: Normal range of motion. Neck supple.  Cardiovascular: Normal rate and regular rhythm.  Exam reveals no friction rub.   No murmur heard. Pulmonary/Chest: Effort normal and breath sounds normal. No respiratory distress. He has no wheezes. He has no rales.  Abdominal: He exhibits no distension. There is tenderness (mild diffuse abdomen). There is no rebound.  Musculoskeletal: Normal range of motion. He exhibits no edema.  Neurological: He is alert and oriented to person, place, and time.  Skin: He is not diaphoretic.    ED Course  Procedures (including critical care time)  Labs Reviewed  CBC - Abnormal; Notable for the following:    Hemoglobin 11.8 (*)     HCT 36.6 (*)     All other components within normal limits  BASIC METABOLIC PANEL  CBC  BASIC METABOLIC PANEL  PRO B NATRIURETIC PEPTIDE   Dg Chest Port 1 View  12/19/2011  *RADIOLOGY REPORT*  Clinical Data: Chest pain and history of coronary artery disease.  PORTABLE CHEST - 1 VIEW  Comparison: 11/15/2008  Findings: Since the prior study, accounting for portable technique, the heart has likely enlarged slightly.  Several coronary stents are visualized.  There is no evidence of heart failure, infiltrate or pleural fluid.  IMPRESSION: Slight interval cardiac enlargement without evidence of heart failure.   Original Report Authenticated By: Irish Lack, M.D.      1. Spasm of right  side abdominal muscles       MDM   Patient is a 46 year old male with history of extensive coronary artery disease who presents with abdominal pain. He says starts in his right lower quadrant extends up to his abdomen to his back and of his chest. Began today. No nausea or vomiting or diarrhea. No shortness of breath  or other chest pain he says is consistent with a heart attack. He has good pulses bilaterally in his groin and his radial arteries. He has no neurologic symptoms. Concern for possible dissection due to pain radiating to his back. CTA chest abdomen pelvis ordered. Troponin normal. BMP normal. CTA of his entire aorta is normal. No evidence of dissection. Patient's pain could be due to cramps or other nonspecific abdominal pain. With normal scan I'm not concerned about any intra-abdominal pathology. Patient stable for discharge. Will give Valium as patient may have suffered from muscle spasms.       Elwin Mocha, MD 12/19/11 [phone]

## 2011-12-19 NOTE — ED Notes (Signed)
Pt  States  He  Takes   A  Survey     Drug    From  [employer]  But  Does  Not  Know  The  Name

## 2011-12-21 ENCOUNTER — Telehealth: Payer: Self-pay | Admitting: Cardiovascular Disease

## 2011-12-21 DIAGNOSIS — R079 Chest pain, unspecified: Secondary | ICD-10-CM

## 2011-12-21 NOTE — Telephone Encounter (Signed)
SPOKE WITH PT RE MESSAGE PT C/O   CHEST CRAMPING SINCE Wednesday WENT TO ER FOR EVAL AND TX WAS TOLD WAS MUSCLE SPASMS PER PT AND WAS GIVEN  SCRIPT FOR VALIUM  THIS IS NOT HELPING   INFORMED  PT TO TRY  TAKING NTG  AS DIRECTED  IF  PAIN REOCCURS   PT WANTED EARLIER APPT NOTHING  AVAILABLE WITH DR Eden Emms OFFERED AN APPT FOR Friday 12-25-11  WITH  LORI  PT REFUSED DECIDED  WOULD KEEP APPT ALREADY SET UP FOR 01-08-12 WITH DR Eden Emms WILL FORWARD TO  DR Eden Emms FOR REVIEW .Zack Seal

## 2011-12-21 NOTE — Telephone Encounter (Signed)
ER visit heart checked out ok.  Describes more abdominal pain.  Can possibly see tomorrow afternoon around 4:00 when I come over to read nucs

## 2011-12-21 NOTE — Telephone Encounter (Signed)
PER DR NISHAN  PT MAY NOT HOLD  PLAVIX FOR PROCEDURE  NEEDS TO TAKE  FOR AT LEAST A YEAR  AFTER LAST PROCEDURE WHICH WAS  AUGUST 2013  FORM FAXED TO FACILITY [facility]

## 2011-12-21 NOTE — Telephone Encounter (Signed)
New Problem:    Patient called in experiencing a dull ache in his chest and overall cramping.  Please call back.

## 2011-12-22 NOTE — Telephone Encounter (Signed)
PER DR NISHAN CANNOT SEE THIS PM TURNS OUT HAS BUSY SCHEDULE AT HOSPITAL  DID WANT PT TO  HAVE LABS DONE LIPASE AND AMYLASE.   PER PT FEELS  MUCH BETTER HAD A GOOD BM   PT INSISTS ON KEEPING APPT WITH DR Eden Emms  DOES NOT WANT TO  BE SEEN THIS WEEK BY PA OR NP  WILL COME IN AM FOR LISTED LAB WORK .Zack Seal

## 2011-12-23 ENCOUNTER — Other Ambulatory Visit (INDEPENDENT_AMBULATORY_CARE_PROVIDER_SITE_OTHER): Payer: Medicaid Other

## 2011-12-23 DIAGNOSIS — R079 Chest pain, unspecified: Secondary | ICD-10-CM

## 2011-12-23 LAB — AMYLASE: Amylase: 358 U/L — ABNORMAL HIGH (ref 27–131)

## 2011-12-23 LAB — LIPASE: Lipase: 455 U/L — ABNORMAL HIGH (ref 11.0–59.0)

## 2011-12-24 ENCOUNTER — Inpatient Hospital Stay (HOSPITAL_COMMUNITY): Admission: RE | Admit: 2011-12-24 | Payer: Medicaid Other | Source: Ambulatory Visit

## 2011-12-24 ENCOUNTER — Emergency Department (HOSPITAL_COMMUNITY): Payer: Medicaid Other

## 2011-12-24 ENCOUNTER — Emergency Department (HOSPITAL_COMMUNITY)
Admission: EM | Admit: 2011-12-24 | Discharge: 2011-12-24 | Disposition: A | Discharge status: Home / Self Care | Payer: Medicaid Other | Attending: Emergency Medicine | Admitting: Emergency Medicine

## 2011-12-24 DIAGNOSIS — Z9861 Coronary angioplasty status: Secondary | ICD-10-CM | POA: Insufficient documentation

## 2011-12-24 DIAGNOSIS — K219 Gastro-esophageal reflux disease without esophagitis: Secondary | ICD-10-CM | POA: Insufficient documentation

## 2011-12-24 DIAGNOSIS — F411 Generalized anxiety disorder: Secondary | ICD-10-CM | POA: Insufficient documentation

## 2011-12-24 DIAGNOSIS — M545 Low back pain, unspecified: Secondary | ICD-10-CM | POA: Insufficient documentation

## 2011-12-24 DIAGNOSIS — Z7901 Long term (current) use of anticoagulants: Secondary | ICD-10-CM | POA: Insufficient documentation

## 2011-12-24 DIAGNOSIS — I2510 Atherosclerotic heart disease of native coronary artery without angina pectoris: Secondary | ICD-10-CM | POA: Insufficient documentation

## 2011-12-24 DIAGNOSIS — G8929 Other chronic pain: Secondary | ICD-10-CM | POA: Insufficient documentation

## 2011-12-24 DIAGNOSIS — E7800 Pure hypercholesterolemia, unspecified: Secondary | ICD-10-CM | POA: Insufficient documentation

## 2011-12-24 DIAGNOSIS — Z8719 Personal history of other diseases of the digestive system: Secondary | ICD-10-CM | POA: Insufficient documentation

## 2011-12-24 DIAGNOSIS — Z862 Personal history of diseases of the blood and blood-forming organs and certain disorders involving the immune mechanism: Secondary | ICD-10-CM | POA: Insufficient documentation

## 2011-12-24 DIAGNOSIS — Z87891 Personal history of nicotine dependence: Secondary | ICD-10-CM | POA: Insufficient documentation

## 2011-12-24 DIAGNOSIS — I10 Essential (primary) hypertension: Secondary | ICD-10-CM | POA: Insufficient documentation

## 2011-12-24 DIAGNOSIS — I252 Old myocardial infarction: Secondary | ICD-10-CM | POA: Insufficient documentation

## 2011-12-24 DIAGNOSIS — Z79899 Other long term (current) drug therapy: Secondary | ICD-10-CM | POA: Insufficient documentation

## 2011-12-24 DIAGNOSIS — J45909 Unspecified asthma, uncomplicated: Secondary | ICD-10-CM | POA: Insufficient documentation

## 2011-12-24 DIAGNOSIS — I739 Peripheral vascular disease, unspecified: Secondary | ICD-10-CM | POA: Insufficient documentation

## 2011-12-24 DIAGNOSIS — E109 Type 1 diabetes mellitus without complications: Secondary | ICD-10-CM | POA: Insufficient documentation

## 2011-12-24 DIAGNOSIS — Z8639 Personal history of other endocrine, nutritional and metabolic disease: Secondary | ICD-10-CM | POA: Insufficient documentation

## 2011-12-24 DIAGNOSIS — K5289 Other specified noninfective gastroenteritis and colitis: Secondary | ICD-10-CM | POA: Insufficient documentation

## 2011-12-24 DIAGNOSIS — Z8669 Personal history of other diseases of the nervous system and sense organs: Secondary | ICD-10-CM | POA: Insufficient documentation

## 2011-12-24 DIAGNOSIS — R748 Abnormal levels of other serum enzymes: Secondary | ICD-10-CM | POA: Insufficient documentation

## 2011-12-24 DIAGNOSIS — Z8701 Personal history of pneumonia (recurrent): Secondary | ICD-10-CM | POA: Insufficient documentation

## 2011-12-24 DIAGNOSIS — K529 Noninfective gastroenteritis and colitis, unspecified: Secondary | ICD-10-CM

## 2011-12-24 LAB — URINALYSIS, ROUTINE W REFLEX MICROSCOPIC
Bilirubin Urine: NEGATIVE
Ketones, ur: NEGATIVE mg/dL
Nitrite: NEGATIVE
Specific Gravity, Urine: 1.014 (ref 1.005–1.030)

## 2011-12-24 LAB — COMPREHENSIVE METABOLIC PANEL
ALT: 28 U/L (ref 0–53)
AST: 28 U/L (ref 0–37)
Albumin: 3.6 g/dL (ref 3.5–5.2)
Alkaline Phosphatase: 85 U/L (ref 39–117)
CO2: 24 mEq/L (ref 19–32)
Chloride: 103 mEq/L (ref 96–112)
GFR calc non Af Amer: 56 mL/min — ABNORMAL LOW (ref >90)
Potassium: 4 mEq/L (ref 3.5–5.1)
Total Bilirubin: 0.2 mg/dL — ABNORMAL LOW (ref 0.3–1.2)

## 2011-12-24 LAB — CBC
HCT: 39.2 % (ref 39.0–52.0)
Hemoglobin: 13 g/dL (ref 13.0–17.0)
MCH: 28 pg (ref 26.0–34.0)
MCHC: 33.2 g/dL (ref 30.0–36.0)

## 2011-12-24 MED ORDER — IOHEXOL 300 MG/ML  SOLN
80.0000 mL | Freq: Once | INTRAMUSCULAR | Status: AC | PRN
  Administered 2011-12-24: 80 mL via INTRAVENOUS

## 2011-12-24 MED ORDER — OXYCODONE-ACETAMINOPHEN 5-325 MG PO TABS
1.0000 | ORAL_TABLET | Freq: Once | ORAL | Status: AC
  Administered 2011-12-24: 1 via ORAL
  Filled 2011-12-24: qty 1

## 2011-12-24 NOTE — ED Notes (Signed)
Pt states that for the last 2 weeks he has been having abd pain.  He reports having some routine lab work done at Dr. Fabio Bering office yesterday.   His office called him last pm and told him "something was wrong with his pancrease"  He reports R sided adb pain, denies n/v.

## 2011-12-24 NOTE — ED Provider Notes (Signed)
History     CSN: [id number]  Arrival date & time 12/24/11  4098   First MD Initiated Contact with Patient 12/24/11 [phone]      Chief Complaint  Patient presents with  . Abdominal Pain    (Consider location/radiation/quality/duration/timing/severity/associated sxs/prior treatment) HPI Comments: Patient is a 46 year old male with a history of substance abuse (former abuse, quit date 09/18/2011), CAD (MI and stent placement and `07 with angioplasty to reopen in 09/18/2011 followed by Dr. Eden Emms), type 1 diabetes and recent lower GI bleed presents emergency department per advice of Dr. Fabio Bering office.  Patient was told "something was wrong with his pancreas".  In reviewing patient's chart he has a significant elevated amylase and lipase, however patient denies any epigastric or left upper quadrant abdominal pain.  Patient's diabetes is currently controlled with metformin.  In addition patient complains of right groin pain.  Onset of groin symptoms began yesterday and are described as radiating down towards his testicles.  Pain worsened with lifting.  No alleviating factors known.  The history is provided by the patient and medical records.    Past Medical History  Diagnosis Date  . ELECTROCARDIOGRAM, ABNORMAL   . Coronary artery disease   . Anginal pain   . Hypertension   . GERD (gastroesophageal reflux disease)   . High cholesterol   . Peripheral vascular disease     LLE  . Myocardial infarction 10/2005  . Asthma     "had it; outgrew it"  . Pneumonia 1990's  . Shortness of breath 09/18/11    "@ rest; lying down; w/exertion"  . Type I diabetes mellitus   . Lower GI bleed 09/18/2011    "clots and everything; not lately"  . Headache 09/18/2011    "maybe twice/wk; pressure on the brain"  . Seizures 09/18/2011    "blanks out on me"/wife's report  . Chronic lower back pain   . Gout   . Anxiety     Past Surgical History  Procedure Date  . Coronary angioplasty with stent placement  10/2005    "9"  . Coronary angioplasty 09/18/2011  . Ines Bloomer hole of cranium 1999    "mugged"  . Laceration repair 1999    BLE "muggine"    No family history on file.  History  Substance Use Topics  . Smoking status: Former Smoker -- 0.5 packs/day for 20 years    Types: Cigarettes    Quit date: 11/02/2005  . Smokeless tobacco: Never Used  . Alcohol Use: Yes     Comment: 09/18/2011 "twice a month; 6 shots at a time"      Review of Systems  Constitutional: Negative for fever, chills and appetite change.  HENT: Negative for congestion.   Eyes: Negative for visual disturbance.  Respiratory: Negative for shortness of breath.   Cardiovascular: Negative for chest pain and leg swelling.  Gastrointestinal: Positive for abdominal pain (right groin).  Genitourinary: Negative for dysuria, urgency, frequency, discharge, penile swelling, scrotal swelling, penile pain and testicular pain.  Neurological: Negative for dizziness, syncope, weakness, light-headedness, numbness and headaches.  Psychiatric/Behavioral: Negative for confusion.  All other systems reviewed and are negative.    Allergies  Crestor  Home Medications   Current Outpatient Rx  Name  Route  Sig  Dispense  Refill  . CLOPIDOGREL BISULFATE 75 MG PO TABS   Oral   Take 1 tablet (75 mg total) by mouth daily with breakfast.   30 tablet   6   . DIAZEPAM 5 MG PO  TABS   Oral   Take 1 tablet (5 mg total) by mouth every 6 (six) hours as needed for anxiety (spasms).   10 tablet   0   . OMEGA-3 FATTY ACIDS 1000 MG PO CAPS   Oral   Take 1 g by mouth 2 (two) times daily.         Marland Kitchen LISINOPRIL 10 MG PO TABS   Oral   Take 1 tablet (10 mg total) by mouth daily.   30 tablet   6   . METFORMIN HCL 500 MG PO TABS   Oral   Take 500 mg by mouth 2 (two) times daily with a meal.         . METOPROLOL SUCCINATE ER 50 MG PO TB24   Oral   Take 1 tablet (50 mg total) by mouth daily. Take with or immediately following a meal.    30 tablet   6   . NITROGLYCERIN 0.4 MG SL SUBL   Sublingual   Place 1 tablet (0.4 mg total) under the tongue every 5 (five) minutes x 3 doses as needed for chest pain.   25 tablet   1   . OMEPRAZOLE 20 MG PO CPDR   Oral   Take 20-40 mg by mouth daily.           BP 126/83  Pulse 61  Temp 98.1 F (36.7 C) (Oral)  Resp 16  Ht 5\' 11"  (1.803 m)  Wt 197 lb (89.359 kg)  BMI 27.48 kg/m2  SpO2 100%  Physical Exam  Nursing note and vitals reviewed. Constitutional: He is oriented to person, place, and time. He appears well-developed and well-nourished. No distress.  HENT:  Head: Normocephalic and atraumatic.  Eyes: Conjunctivae normal and EOM are normal.  Neck: Normal range of motion.  Cardiovascular: Normal rate, regular rhythm, normal heart sounds and intact distal pulses.   Pulmonary/Chest: Effort normal.  Abdominal:       Soft nontender abdomen with normal bowel sounds  Genitourinary:       Tenderness to palpation of right inguinal region with questionable right inguinal hernia palpated while coughing  Musculoskeletal: Normal range of motion.  Neurological: He is alert and oriented to person, place, and time.  Skin: Skin is warm and dry. No rash noted. He is not diaphoretic.  Psychiatric: He has a normal mood and affect. His behavior is normal.    ED Course  Procedures (including critical care time)  Labs Reviewed  COMPREHENSIVE METABOLIC PANEL - Abnormal; Notable for the following:    Glucose, Bld 177 (*)     Creatinine, Ser 1.44 (*)     Total Bilirubin 0.2 (*)     GFR calc non Af Amer 56 (*)     GFR calc Af Amer 65 (*)     All other components within normal limits  LIPASE, BLOOD - Abnormal; Notable for the following:    Lipase 595 (*)     All other components within normal limits  URINALYSIS, ROUTINE W REFLEX MICROSCOPIC - Abnormal; Notable for the following:    Glucose, UA 100 (*)     Hgb urine dipstick MODERATE (*)     All other components within normal  limits  GLUCOSE, CAPILLARY - Abnormal; Notable for the following:    Glucose-Capillary 163 (*)     All other components within normal limits  CBC  URINE MICROSCOPIC-ADD ON   Ct Abdomen Pelvis W Contrast  12/24/2011  *RADIOLOGY REPORT*  Clinical Data: Abdominal  pain.  Seizure.  CT ABDOMEN AND PELVIS WITH CONTRAST  Technique:  Multidetector CT imaging of the abdomen and pelvis was performed following the standard protocol during bolus administration of intravenous contrast.  Contrast: 80mL OMNIPAQUE IOHEXOL 300 MG/ML  SOLN  Comparison: 12/19/2011  Findings: Stable borderline dilated dorsal pancreatic duct. Indistinct focus of enhancement in segment two of the liver, 1.1 x 0.9 cm on image eight of series 2.  Liver otherwise unremarkable.  The spleen and adrenal glands appear normal.  Bilateral hypodense renal lesions likely represent cysts, although multiple lesions are technically too small to characterize.  No pathologic retroperitoneal or porta hepatis adenopathy is identified.  There is an appearance favoring prior avulsion of the left lateral abdominal wall musculature from the left iliac crest; this is a chronic appearance.  Appendix unremarkable.  Equivocal generalized small bowel wall thickening, query enteritis. The gallbladder and biliary system appear unremarkable.  No pathologic pelvic adenopathy is identified.  Urinary bladder unremarkable.  Disc osteophyte complex noted at L5 S1.  No overt findings of diverticulosis.  IMPRESSION:  1.  Suspicion for mild small bowel wall thickening proximally, query proximal enteritis. 2.  Faint and indistinct focus of enhancement in segment 2 of the liver, possibly simply due to transit hepatic attenuation difference, but technically nonspecific.  Differential diagnostic considerations include a small hemangioma, focal nodular hyperplasia, or much less likely malignancy. 3.  Remote prior avulsion of the left lateral abdominal wall musculature in the left iliac  crest.   Original Report Authenticated By: Gaylyn Rong, M.D.      No diagnosis found.  BP 126/83  Pulse 61  Temp 98.1 F (36.7 C) (Oral)  Resp 16  Ht 5\' 11"  (1.803 m)  Wt 197 lb (89.359 kg)  BMI 27.48 kg/m2  SpO2 19%  MDM  46 year old patient with a history of CAD followed by Dr. Eden Emms, hypertension and type 1 diabetes presents emergency department because his cardiologist found a significantly high lipase and amylase result.  Patient states that his diabetes is controlled with metformin and he did not have any hyperglycemic symptoms at this time.  Throughout hospital stay patient was asymptomatic for any pancreatitis type results including retching, hiccups nausea, emesis, upper abdominal pain or back pain.  Although lipase is significantly elevated it is felt that patient can be discharged as they are asymptomatic.  Labs and imaging reviewed and discussed with attending Dr. Ignacia Palma who agrees with my plan to discharge patient with strict instructions for followup and return precautions. Possible small proximal enteritis discussed as well.  Bowel rest discussed and patient is aware to be on a clear liquid diet.  Patient is with normal vital signs no acute distress prior to discharge.  At this time there does not appear to be any evidence of an acute emergency medical condition and the patient appears stable for discharge with appropriate outpatient follow up.Diagnosis was discussed with patient who verbalizes understanding and is agreeable to discharge. Pt case discussed with Dr. Ignacia Palma who agrees with my plan.        Jaci Carrel, [place] 12/24/11 1510

## 2011-12-24 NOTE — ED Notes (Signed)
Notified RN of CBG 163

## 2011-12-24 NOTE — ED Provider Notes (Signed)
Medical screening examination/treatment/procedure(s) were performed by non-physician practitioner and as supervising physician I was immediately available for consultation/collaboration. Pt's lipase was elevated, but he does not have abdominal pain at present.  Symptomatic Rx advised.  Carleene Cooper III, MD 12/24/11 [phone]

## 2011-12-26 NOTE — ED Provider Notes (Signed)
I saw and evaluated the patient, reviewed the resident's note and I agree with the findings and plan.   Toy Baker, MD 12/26/11 [phone]

## 2011-12-28 ENCOUNTER — Ambulatory Visit (HOSPITAL_COMMUNITY): Payer: Medicaid Other

## 2011-12-29 ENCOUNTER — Emergency Department (HOSPITAL_COMMUNITY)
Admission: EM | Admit: 2011-12-29 | Discharge: 2011-12-29 | Disposition: A | Discharge status: Home / Self Care | Payer: Medicaid Other | Source: Home / Self Care

## 2011-12-29 ENCOUNTER — Encounter (HOSPITAL_COMMUNITY): Payer: Self-pay

## 2011-12-29 DIAGNOSIS — K859 Acute pancreatitis without necrosis or infection, unspecified: Secondary | ICD-10-CM

## 2011-12-29 DIAGNOSIS — I2510 Atherosclerotic heart disease of native coronary artery without angina pectoris: Secondary | ICD-10-CM

## 2011-12-29 DIAGNOSIS — E119 Type 2 diabetes mellitus without complications: Secondary | ICD-10-CM

## 2011-12-29 DIAGNOSIS — I200 Unstable angina: Secondary | ICD-10-CM

## 2011-12-29 DIAGNOSIS — E782 Mixed hyperlipidemia: Secondary | ICD-10-CM

## 2011-12-29 DIAGNOSIS — I10 Essential (primary) hypertension: Secondary | ICD-10-CM

## 2011-12-29 DIAGNOSIS — Z23 Encounter for immunization: Secondary | ICD-10-CM

## 2011-12-29 MED ORDER — RANITIDINE HCL 150 MG PO CAPS: 150.0000 mg | ORAL_CAPSULE | Freq: Two times a day (BID) | ORAL | Status: DC

## 2011-12-29 MED ORDER — CLOPIDOGREL BISULFATE 75 MG PO TABS: 75.0000 mg | ORAL_TABLET | Freq: Every day | ORAL | Status: DC

## 2011-12-29 MED ORDER — METFORMIN HCL 500 MG PO TABS: 500.0000 mg | ORAL_TABLET | Freq: Two times a day (BID) | ORAL | Status: DC

## 2011-12-29 MED ORDER — INFLUENZA VIRUS VACC SPLIT PF IM SUSP
0.5000 mL | Freq: Once | INTRAMUSCULAR | Status: AC
  Administered 2011-12-29: 0.5 mL via INTRAMUSCULAR

## 2011-12-29 NOTE — ED Provider Notes (Signed)
History     CSN: [id number]  Arrival date & time 12/29/11  1649  Chief Complaint  Patient presents with  . Follow-up   HPI The patient is presenting as a followup from a recent ER visit where he was found to have pancreatitis.  He had been drinking alcohol at the time.  The patient reports that he has a history of substance abuse but he discontinued all alcohol consumption after being diagnosed with pancreatitis.  He reports that he feels much better now he's having no abdominal pain and he is tolerating a diet.  He's not having any nausea or vomiting.  The patient has a complicated medical history including diabetes mellitus and coronary artery disease status post myocardial infarction with multiple cardiac stents in place.  He is following a cardiologist that [facility].  He is seeing Dr. Charlton Haws.  The patient reports no chest pain or shortness of breath.   Past Medical History  Diagnosis Date  . ELECTROCARDIOGRAM, ABNORMAL   . Coronary artery disease   . Anginal pain   . Hypertension   . GERD (gastroesophageal reflux disease)   . High cholesterol   . Peripheral vascular disease     LLE  . Myocardial infarction 10/2005  . Asthma     "had it; outgrew it"  . Pneumonia 1990's  . Shortness of breath 09/18/11    "@ rest; lying down; w/exertion"  . Type I diabetes mellitus   . Lower GI bleed 09/18/2011    "clots and everything; not lately"  . Headache 09/18/2011    "maybe twice/wk; pressure on the brain"  . Seizures 09/18/2011    "blanks out on me"/wife's report  . Chronic lower back pain   . Gout   . Anxiety     Past Surgical History  Procedure Date  . Coronary angioplasty with stent placement 10/2005    "9"  . Coronary angioplasty 09/18/2011  . Ines Bloomer hole of cranium 1999    "mugged"  . Laceration repair 1999    BLE "muggine"    No family history on file.  History  Substance Use Topics  . Smoking status: Former Smoker -- 0.5 packs/day for 20 years     Types: Cigarettes    Quit date: 11/02/2005  . Smokeless tobacco: Never Used  . Alcohol Use: Yes     Comment: 09/18/2011 "twice a month; 6 shots at a time"    Review of Systems  Constitutional: Negative.   HENT: Negative.   Eyes: Negative.   Respiratory: Negative.   Cardiovascular: Negative.   Gastrointestinal: Negative.   Genitourinary: Negative.   Musculoskeletal: Negative.   Neurological: Negative.   Hematological: Negative.   Psychiatric/Behavioral: Negative.   All other systems reviewed and are negative.    Allergies  Crestor  Home Medications   Current Outpatient Rx  Name  Route  Sig  Dispense  Refill  . CLOPIDOGREL BISULFATE 75 MG PO TABS   Oral   Take 1 tablet (75 mg total) by mouth daily with breakfast.   30 tablet   6   . DIAZEPAM 5 MG PO TABS   Oral   Take 1 tablet (5 mg total) by mouth every 6 (six) hours as needed for anxiety (spasms).   10 tablet   0   . OMEGA-3 FATTY ACIDS 1000 MG PO CAPS   Oral   Take 1 g by mouth 2 (two) times daily.         Marland Kitchen LISINOPRIL 10  MG PO TABS   Oral   Take 1 tablet (10 mg total) by mouth daily.   30 tablet   6   . METFORMIN HCL 500 MG PO TABS   Oral   Take 500 mg by mouth 2 (two) times daily with a meal.         . METOPROLOL SUCCINATE ER 50 MG PO TB24   Oral   Take 1 tablet (50 mg total) by mouth daily. Take with or immediately following a meal.   30 tablet   6   . NITROGLYCERIN 0.4 MG SL SUBL   Sublingual   Place 1 tablet (0.4 mg total) under the tongue every 5 (five) minutes x 3 doses as needed for chest pain.   25 tablet   1   . OMEPRAZOLE 20 MG PO CPDR   Oral   Take 20-40 mg by mouth daily.           BP 130/82  Pulse 60  Temp 97.9 F (36.6 C) (Oral)  Resp 20  SpO2 100%  Physical Exam  Nursing note and vitals reviewed. Constitutional: He is oriented to person, place, and time. He appears well-developed and well-nourished. No distress.  HENT:  Head: Normocephalic and atraumatic.   Eyes: EOM are normal. Pupils are equal, round, and reactive to light.  Neck: Normal range of motion. Neck supple.  Cardiovascular: Normal rate, regular rhythm and normal heart sounds.   Pulmonary/Chest: Effort normal and breath sounds normal.  Abdominal: Soft. Bowel sounds are normal.  Musculoskeletal: Normal range of motion. He exhibits no edema.  Neurological: He is alert and oriented to person, place, and time.  Skin: Skin is warm and dry.  Psychiatric: He has a normal mood and affect. His behavior is normal. Judgment and thought content normal.    ED Course  Procedures (including critical care time)  Labs Reviewed - No data to display No results found.  No diagnosis found.  MDM  IMPRESSION  HTN  Type 2 DM  Pancreatitis  History of polysubstance abuse  History of alcoholism  RECOMMENDATIONS / PLAN  Pt wants to come back in 1 month to get labs done Pt was advised to continue to follow up with his PCP.   Flu vaccine provided today I encouraged continued abstinence from alcohol and recreational substances.  The patient has discontinued smoking and I encouraged him to resist starting back with tobacco products.  The patient verbalized understanding.  I encouraged him to check his blood glucose closely and continue to monitor blood sugar.  We will check his A1c in one month with his regular lab draw.  FOLLOW UP 1 month  The patient was given clear instructions to go to ER or return to medical center if symptoms don't improve, worsen or new problems develop.  The patient verbalized understanding.  The patient was told to call to get lab results if they haven't heard anything in the next week.           Cleora Fleet, MD 12/29/11 2003

## 2011-12-29 NOTE — ED Notes (Signed)
Patient states here last week and was transferred to the ED per patient dx  Was pancreatitis

## 2011-12-30 ENCOUNTER — Ambulatory Visit (HOSPITAL_COMMUNITY): Payer: Medicaid Other

## 2012-01-01 ENCOUNTER — Ambulatory Visit (HOSPITAL_COMMUNITY): Payer: Medicaid Other

## 2012-01-04 ENCOUNTER — Ambulatory Visit (HOSPITAL_COMMUNITY): Payer: Medicaid Other

## 2012-01-06 ENCOUNTER — Ambulatory Visit (HOSPITAL_COMMUNITY): Payer: Medicaid Other

## 2012-01-08 ENCOUNTER — Ambulatory Visit (INDEPENDENT_AMBULATORY_CARE_PROVIDER_SITE_OTHER): Payer: Medicaid Other | Admitting: Cardiovascular Disease

## 2012-01-08 ENCOUNTER — Ambulatory Visit (HOSPITAL_COMMUNITY): Payer: Medicaid Other

## 2012-01-08 VITALS — BP 110/68 | HR 68 | Ht 71.0 in | Wt 185.1 lb

## 2012-01-08 DIAGNOSIS — K859 Acute pancreatitis without necrosis or infection, unspecified: Secondary | ICD-10-CM

## 2012-01-08 DIAGNOSIS — I2510 Atherosclerotic heart disease of native coronary artery without angina pectoris: Secondary | ICD-10-CM

## 2012-01-08 DIAGNOSIS — I10 Essential (primary) hypertension: Secondary | ICD-10-CM

## 2012-01-08 DIAGNOSIS — I25119 Atherosclerotic heart disease of native coronary artery with unspecified angina pectoris: Secondary | ICD-10-CM | POA: Insufficient documentation

## 2012-01-08 DIAGNOSIS — E782 Mixed hyperlipidemia: Secondary | ICD-10-CM

## 2012-01-08 MED ORDER — LISINOPRIL 10 MG PO TABS: 10.0000 mg | ORAL_TABLET | Freq: Every day | ORAL | Status: DC

## 2012-01-08 NOTE — Assessment & Plan Note (Signed)
Long discussion about long term side effects of drinking and consequences of pancreatitis.  Abdominal exam improved F/U primary

## 2012-01-08 NOTE — Progress Notes (Signed)
Patient ID: Willie Walker, male   DOB: March 17, 1965, 46 y.o.   MRN: [id number] 46 yo with CAD Multiple stents placed in [place] with full metal jacket to RCA and stents in IM and circuflex Cath 8/29  Cardiac Catheterization, 09/17/11:  Coronary Arteries:  Right dominant with no anomalies  LM: 30% distal   LAD: proximal- normal, mid-60% diffuse, distal-40% multiple discrete IM- Large vessel 30% instent restenosis D1- 40-50% multiple discrete lesions  Circumflex: Single OM/AV groove branch 95% instent restenosis  RCA: Likely previously disected during attempted PCI with full metal jacket 90% proximal instent restonosis. Diffuse 80% instent restenosis distally extending into PDA  And large RV branch  Ventriculography: EF: 60%%, No RWMA;s  Had cutting balloon PCI with Dr [place] to proximal and mid RCA and circumflex.  Lesion Data:  Vessel: Mid RCA  Percent stenosis (pre): 90%  TIMI-flow (pre): 3  PCI: 3.0 mm cutting balloon  Percent stenosis (post): 0%  TIMI-flow (post): 3  Vessel: Mid LCx  Percent stenoses (pre): 95%  TIMI flow (pre): 3  PCI: 3.0 mm cutting balloon  Percent stenoses (post): 0%  TIMI flow (post): 3   Since D/C no chest pain. Has not taken lisinopril for weeks.  Started drinking a lot and was in the ER with pancreatitis  ROS: Denies fever, malais, weight loss, blurry vision, decreased visual acuity, cough, sputum, SOB, hemoptysis, pleuritic pain, palpitaitons, heartburn, abdominal pain, melena, lower extremity edema, claudication, or rash.  All other systems reviewed and negative  General: Affect appropriate Healthy:  appears stated age Stutters HEENT: normal Neck supple with no adenopathy JVP normal no bruits no thyromegaly Lungs clear with no wheezing and good diaphragmatic motion Heart:  S1/S2 no murmur, no rub, gallop or click PMI normal Abdomen: benighn, BS positve, no tenderness, no AAA no bruit.  No HSM or HJR Distal pulses intact with no bruits No  edema Neuro non-focal Skin warm and dry No muscular weakness   Current Outpatient Prescriptions  Medication Sig Dispense Refill  . clopidogrel (PLAVIX) 75 MG tablet Take 1 tablet (75 mg total) by mouth daily with breakfast.  30 tablet  6  . diazepam (VALIUM) 5 MG tablet Take 1 tablet (5 mg total) by mouth every 6 (six) hours as needed for anxiety (spasms).  10 tablet  0  . fish oil-omega-3 fatty acids 1000 MG capsule Take 1 g by mouth 2 (two) times daily.      Marland Kitchen lisinopril (PRINIVIL,ZESTRIL) 10 MG tablet Take 1 tablet (10 mg total) by mouth daily.  30 tablet  6  . metFORMIN (GLUCOPHAGE) 500 MG tablet Take 1 tablet (500 mg total) by mouth 2 (two) times daily with a meal.  60 tablet  4  . metoprolol succinate (TOPROL-XL) 50 MG 24 hr tablet Take 1 tablet (50 mg total) by mouth daily. Take with or immediately following a meal.  30 tablet  6  . nitroGLYCERIN (NITROSTAT) 0.4 MG SL tablet Place 1 tablet (0.4 mg total) under the tongue every 5 (five) minutes x 3 doses as needed for chest pain.  25 tablet  1  . ranitidine (ZANTAC) 150 MG capsule Take 1 capsule (150 mg total) by mouth 2 (two) times daily.  60 capsule  4  . [DISCONTINUED] omeprazole (PRILOSEC) 20 MG capsule Take 20-40 mg by mouth daily.        Allergies  Crestor  Electrocardiogram:  SR rate 58 T wave inversion 3, and F done 12/24/11 Assessment and Plan

## 2012-01-08 NOTE — Patient Instructions (Addendum)
Your physician wants you to follow-up in:  6 MONTHS WITH DR NISHAN  You will receive a reminder letter in the mail two months in advance. If you don't receive a letter, please call our office to schedule the follow-up appointment. Your physician recommends that you continue on your current medications as directed. Please refer to the Current Medication list given to you today. 

## 2012-01-08 NOTE — Assessment & Plan Note (Signed)
Cholesterol is at goal.  Continue current dose of statin and diet Rx.  No myalgias or side effects.  F/U  LFT's in 6 months. Lab Results  Component Value Date   LDLCALC 98 01/22/2009

## 2012-01-08 NOTE — Assessment & Plan Note (Signed)
Suboptimally controlled due to noncompliance  Samples of benicar given and lisinopril refilled

## 2012-01-08 NOTE — Assessment & Plan Note (Signed)
Stable with no angina and good activity level.  Continue medical Rx.  Long term dual platlet Rx advised given extent of his stenting

## 2012-01-11 ENCOUNTER — Ambulatory Visit (HOSPITAL_COMMUNITY): Payer: Medicaid Other

## 2012-01-15 ENCOUNTER — Ambulatory Visit (HOSPITAL_COMMUNITY): Payer: Medicaid Other

## 2012-01-18 ENCOUNTER — Ambulatory Visit (HOSPITAL_COMMUNITY): Payer: Medicaid Other

## 2012-01-22 ENCOUNTER — Ambulatory Visit (HOSPITAL_COMMUNITY): Payer: Medicaid Other

## 2012-01-25 ENCOUNTER — Ambulatory Visit (HOSPITAL_COMMUNITY): Payer: Medicaid Other

## 2012-01-27 ENCOUNTER — Ambulatory Visit (HOSPITAL_COMMUNITY): Payer: Medicaid Other

## 2012-01-29 ENCOUNTER — Ambulatory Visit (HOSPITAL_COMMUNITY): Payer: Medicaid Other

## 2012-02-01 ENCOUNTER — Ambulatory Visit (HOSPITAL_COMMUNITY): Payer: Medicaid Other

## 2012-02-03 ENCOUNTER — Ambulatory Visit (HOSPITAL_COMMUNITY): Payer: Medicaid Other

## 2012-02-05 ENCOUNTER — Ambulatory Visit (HOSPITAL_COMMUNITY): Payer: Medicaid Other

## 2012-02-08 ENCOUNTER — Ambulatory Visit (HOSPITAL_COMMUNITY): Payer: Medicaid Other

## 2012-02-10 ENCOUNTER — Ambulatory Visit (HOSPITAL_COMMUNITY): Payer: Medicaid Other

## 2012-02-12 ENCOUNTER — Ambulatory Visit (HOSPITAL_COMMUNITY): Payer: Medicaid Other

## 2012-02-15 ENCOUNTER — Ambulatory Visit (HOSPITAL_COMMUNITY): Payer: Medicaid Other

## 2012-02-17 ENCOUNTER — Ambulatory Visit (HOSPITAL_COMMUNITY): Payer: Medicaid Other

## 2012-02-19 ENCOUNTER — Ambulatory Visit (HOSPITAL_COMMUNITY): Payer: Medicaid Other

## 2012-02-22 ENCOUNTER — Ambulatory Visit (HOSPITAL_COMMUNITY): Payer: Medicaid Other

## 2012-02-24 ENCOUNTER — Ambulatory Visit (HOSPITAL_COMMUNITY): Payer: Medicaid Other

## 2012-02-26 ENCOUNTER — Ambulatory Visit (HOSPITAL_COMMUNITY): Payer: Medicaid Other

## 2012-02-29 ENCOUNTER — Ambulatory Visit (HOSPITAL_COMMUNITY): Payer: Medicaid Other

## 2012-03-01 ENCOUNTER — Emergency Department (INDEPENDENT_AMBULATORY_CARE_PROVIDER_SITE_OTHER)
Admission: EM | Admit: 2012-03-01 | Discharge: 2012-03-01 | Disposition: A | Discharge status: Home / Self Care | Payer: Medicaid Other | Source: Home / Self Care | Attending: Emergency Medicine | Admitting: Emergency Medicine

## 2012-03-01 ENCOUNTER — Emergency Department (INDEPENDENT_AMBULATORY_CARE_PROVIDER_SITE_OTHER): Payer: Medicaid Other

## 2012-03-01 ENCOUNTER — Encounter (HOSPITAL_COMMUNITY): Payer: Self-pay | Admitting: Emergency Medicine

## 2012-03-01 DIAGNOSIS — S46019A Strain of muscle(s) and tendon(s) of the rotator cuff of unspecified shoulder, initial encounter: Secondary | ICD-10-CM

## 2012-03-01 DIAGNOSIS — S20219A Contusion of unspecified front wall of thorax, initial encounter: Secondary | ICD-10-CM

## 2012-03-01 DIAGNOSIS — S43429A Sprain of unspecified rotator cuff capsule, initial encounter: Secondary | ICD-10-CM

## 2012-03-01 MED ORDER — HYDROCODONE-ACETAMINOPHEN 5-325 MG PO TABS: ORAL_TABLET | ORAL | Status: DC
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
## 2012-03-02 ENCOUNTER — Ambulatory Visit (HOSPITAL_COMMUNITY): Payer: Medicaid Other

## 2012-03-04 ENCOUNTER — Ambulatory Visit (HOSPITAL_COMMUNITY): Payer: Medicaid Other

## 2012-03-07 ENCOUNTER — Ambulatory Visit (HOSPITAL_COMMUNITY): Payer: Medicaid Other

## 2012-03-09 ENCOUNTER — Ambulatory Visit (HOSPITAL_COMMUNITY): Payer: Medicaid Other

## 2012-03-10 ENCOUNTER — Encounter (HOSPITAL_COMMUNITY): Payer: Self-pay

## 2012-03-10 ENCOUNTER — Emergency Department (HOSPITAL_COMMUNITY)
Admission: EM | Admit: 2012-03-10 | Discharge: 2012-03-10 | Disposition: A | Discharge status: Home / Self Care | Payer: Medicaid Other | Source: Home / Self Care | Attending: Family Medicine | Admitting: Family Medicine

## 2012-03-10 DIAGNOSIS — I10 Essential (primary) hypertension: Secondary | ICD-10-CM

## 2012-03-10 DIAGNOSIS — E1169 Type 2 diabetes mellitus with other specified complication: Secondary | ICD-10-CM | POA: Insufficient documentation

## 2012-03-10 DIAGNOSIS — I2510 Atherosclerotic heart disease of native coronary artery without angina pectoris: Secondary | ICD-10-CM

## 2012-03-10 DIAGNOSIS — E782 Mixed hyperlipidemia: Secondary | ICD-10-CM

## 2012-03-10 DIAGNOSIS — E119 Type 2 diabetes mellitus without complications: Secondary | ICD-10-CM

## 2012-03-10 LAB — BASIC METABOLIC PANEL
BUN: 11 mg/dL (ref 6–23)
CO2: 26 mEq/L (ref 19–32)
Calcium: 9.6 mg/dL (ref 8.4–10.5)
Chloride: 104 mEq/L (ref 96–112)
Creatinine, Ser: 0.99 mg/dL (ref 0.50–1.35)
GFR calc Af Amer: >90 mL/min (ref >90)
GFR calc non Af Amer: >90 mL/min (ref >90)
Glucose, Bld: 110 mg/dL — ABNORMAL HIGH (ref 70–99)
Potassium: 4.1 mEq/L (ref 3.5–5.1)
Sodium: 140 mEq/L (ref 135–145)

## 2012-03-10 LAB — LIPID PANEL
Cholesterol: 265 mg/dL — ABNORMAL HIGH (ref 0–200)
HDL: 149 mg/dL (ref >39)
LDL Cholesterol: 93 mg/dL (ref 0–99)
Total CHOL/HDL Ratio: 1.8 RATIO
Triglycerides: 117 mg/dL (ref <150)
VLDL: 23 mg/dL (ref 0–40)

## 2012-03-10 LAB — HEMOGLOBIN A1C
Hgb A1c MFr Bld: 6.7 % — ABNORMAL HIGH (ref <5.7)
Mean Plasma Glucose: 146 mg/dL — ABNORMAL HIGH (ref <117)

## 2012-03-10 MED ORDER — SILDENAFIL CITRATE 25 MG PO TABS: 25.0000 mg | ORAL_TABLET | Freq: Every day | ORAL | Status: DC | PRN

## 2012-03-10 NOTE — ED Provider Notes (Signed)
History     CSN: [id number]  Arrival date & time 03/10/12  1117   First MD Initiated Contact with Patient 03/10/12 1159      Chief Complaint  Patient presents with  . Follow-up    HPI Pt says that he has been well.  He says that his blood glucose has been doing well.  He last tested 2 days ago and his BG was 116. He hasn't been testing everyday.  He says that he has not had to use his nitroglycerin since being discharged from the hospital.  He is reporting that he is having some erectile dysfunction.  He is having difficulty maintaining erections.  He reports that he is taking his medications as prescribed.     Past Medical History  Diagnosis Date  . ELECTROCARDIOGRAM, ABNORMAL   . Coronary artery disease   . Anginal pain   . Hypertension   . GERD (gastroesophageal reflux disease)   . High cholesterol   . Peripheral vascular disease     LLE  . Myocardial infarction 10/2005  . Asthma     "had it; outgrew it"  . Pneumonia 1990's  . Shortness of breath 09/18/11    "@ rest; lying down; w/exertion"  . Type I diabetes mellitus   . Lower GI bleed 09/18/2011    "clots and everything; not lately"  . Headache 09/18/2011    "maybe twice/wk; pressure on the brain"  . Seizures 09/18/2011    "blanks out on me"/wife's report  . Chronic lower back pain   . Gout   . Anxiety     Past Surgical History  Procedure Laterality Date  . Coronary angioplasty with stent placement  10/2005    "9"  . Coronary angioplasty  09/18/2011  . Ines Bloomer hole of cranium  1999    "mugged"  . Laceration repair  1999    BLE "muggine"    No family history on file.  History  Substance Use Topics  . Smoking status: Former Smoker -- 0.50 packs/day for 20 years    Types: Cigarettes    Quit date: 11/02/2005  . Smokeless tobacco: Never Used  . Alcohol Use: No     Comment: 09/18/2011 "twice a month; 6 shots at a time"    Review of Systems  Genitourinary:       Erectile dysfunction   All other systems  reviewed and are negative.    Allergies  Crestor  Home Medications   Current Outpatient Rx  Name  Route  Sig  Dispense  Refill  . clopidogrel (PLAVIX) 75 MG tablet   Oral   Take 1 tablet (75 mg total) by mouth daily with breakfast.   30 tablet   6   . diazepam (VALIUM) 5 MG tablet   Oral   Take 1 tablet (5 mg total) by mouth every 6 (six) hours as needed for anxiety (spasms).   10 tablet   0   . fish oil-omega-3 fatty acids 1000 MG capsule   Oral   Take 1 g by mouth 2 (two) times daily.         Marland Kitchen HYDROcodone-acetaminophen (NORCO/VICODIN) 5-325 MG per tablet      1 to 2 tabs every 4 to 6 hours as needed for pain.   20 tablet   0   . lisinopril (PRINIVIL,ZESTRIL) 10 MG tablet   Oral   Take 1 tablet (10 mg total) by mouth daily.   30 tablet   11   . metFORMIN (  GLUCOPHAGE) 500 MG tablet   Oral   Take 1 tablet (500 mg total) by mouth 2 (two) times daily with a meal.   60 tablet   4   . metoprolol succinate (TOPROL-XL) 50 MG 24 hr tablet   Oral   Take 1 tablet (50 mg total) by mouth daily. Take with or immediately following a meal.   30 tablet   6   . nitroGLYCERIN (NITROSTAT) 0.4 MG SL tablet   Sublingual   Place 1 tablet (0.4 mg total) under the tongue every 5 (five) minutes x 3 doses as needed for chest pain.   25 tablet   1   . ranitidine (ZANTAC) 150 MG capsule   Oral   Take 1 capsule (150 mg total) by mouth 2 (two) times daily.   60 capsule   4     BP 125/97  Pulse 63  Temp(Src) 98 F (36.7 C) (Oral)  SpO2 98%  Physical Exam  Nursing note and vitals reviewed. Constitutional: He is oriented to person, place, and time. He appears well-developed and well-nourished. No distress.  HENT:  Head: Normocephalic and atraumatic.  Mouth/Throat: No oropharyngeal exudate.  Eyes: Conjunctivae and EOM are normal. Pupils are equal, round, and reactive to light.  Neck: Normal range of motion. Neck supple. No JVD present.  Cardiovascular: Normal rate,  regular rhythm and normal heart sounds.   Pulmonary/Chest: Effort normal and breath sounds normal. No respiratory distress.  Abdominal: Soft. Bowel sounds are normal. He exhibits no distension and no mass. There is no tenderness. There is no rebound and no guarding.  Musculoskeletal: Normal range of motion. He exhibits no edema.  Lymphadenopathy:    He has no cervical adenopathy.  Neurological: He is alert and oriented to person, place, and time. No cranial nerve deficit. He exhibits normal muscle tone. Coordination normal.  Skin: Skin is warm and dry. No rash noted. No erythema. No pallor.  Psychiatric: He has a normal mood and affect. His behavior is normal. Judgment and thought content normal.    ED Course  Procedures (including critical care time)  Labs Reviewed - No data to display No results found.   No diagnosis found.  MDM  IMPRESSION  Diabetes Mellitus type 2  Hypertension  CAD with 9 stents  Dyslipdemia  Erectile Dysfunction  RECOMMENDATIONS / PLAN Trial of low dose viagra 25 mg  Pt was strongly advised that he can't take nitroglycerin or nitroglycerin containing products while viagra is in his system. The patient verbalized understanding.  The patient is also interested in an erection device pump.  Pt was advised to continue to follow up closely with his cardiologist and other specialists.  Continue to monitor blood glucose.  Diabetic foot care, dental care, eye care advised - Pt verbalized understanding  FOLLOW UP 2 months  The patient was given clear instructions to go to ER or return to medical center if symptoms don't improve, worsen or new problems develop.  The patient verbalized understanding.  The patient was told to call to get lab results if they haven't heard anything in the next week.           Cleora Fleet, MD 03/10/12 2249

## 2012-03-10 NOTE — ED Notes (Signed)
Follow up DM

## 2012-03-11 ENCOUNTER — Ambulatory Visit (HOSPITAL_COMMUNITY): Payer: Medicaid Other

## 2012-03-11 NOTE — Progress Notes (Signed)
Quick Note:  Please inform patient that his labs came back OK. His A1c was good at 6.7%. Recommend to have labs rechecked in 3 months.   Rodney Langton, MD, CDE, FAAFP [facility] [place], [place]   ______

## 2012-03-14 ENCOUNTER — Ambulatory Visit (HOSPITAL_COMMUNITY): Payer: Medicaid Other

## 2012-03-16 ENCOUNTER — Ambulatory Visit (HOSPITAL_COMMUNITY): Payer: Medicaid Other

## 2012-03-18 ENCOUNTER — Ambulatory Visit (HOSPITAL_COMMUNITY): Payer: Medicaid Other

## 2012-03-21 ENCOUNTER — Ambulatory Visit (HOSPITAL_COMMUNITY): Payer: Medicaid Other

## 2012-03-23 ENCOUNTER — Ambulatory Visit (HOSPITAL_COMMUNITY): Payer: Medicaid Other

## 2012-03-25 ENCOUNTER — Ambulatory Visit (HOSPITAL_COMMUNITY): Payer: Medicaid Other

## 2012-03-28 ENCOUNTER — Ambulatory Visit (HOSPITAL_COMMUNITY): Payer: Medicaid Other

## 2012-03-30 ENCOUNTER — Ambulatory Visit (HOSPITAL_COMMUNITY): Payer: Medicaid Other

## 2012-04-01 ENCOUNTER — Ambulatory Visit (HOSPITAL_COMMUNITY): Payer: Medicaid Other

## 2012-06-01 ENCOUNTER — Telehealth: Payer: Self-pay | Admitting: Family Medicine

## 2012-06-19 ENCOUNTER — Other Ambulatory Visit: Payer: Self-pay | Admitting: Family Medicine

## 2012-06-20 ENCOUNTER — Encounter: Payer: Self-pay | Admitting: Family Medicine

## 2012-06-20 ENCOUNTER — Ambulatory Visit: Payer: Medicaid Other | Attending: Family Medicine | Admitting: Family Medicine

## 2012-06-20 DIAGNOSIS — I10 Essential (primary) hypertension: Secondary | ICD-10-CM

## 2012-06-20 DIAGNOSIS — E119 Type 2 diabetes mellitus without complications: Secondary | ICD-10-CM | POA: Insufficient documentation

## 2012-06-20 DIAGNOSIS — L84 Corns and callosities: Secondary | ICD-10-CM | POA: Insufficient documentation

## 2012-06-20 DIAGNOSIS — I2510 Atherosclerotic heart disease of native coronary artery without angina pectoris: Secondary | ICD-10-CM

## 2012-06-20 DIAGNOSIS — L97409 Non-pressure chronic ulcer of unspecified heel and midfoot with unspecified severity: Secondary | ICD-10-CM | POA: Insufficient documentation

## 2012-06-20 MED ORDER — METFORMIN HCL 500 MG PO TABS: 500.0000 mg | ORAL_TABLET | Freq: Two times a day (BID) | ORAL | Status: DC

## 2012-06-20 NOTE — Progress Notes (Signed)
Patient ID: Willie Walker, male   DOB: 1965-03-19, 47 y.o.   MRN: [id number]  CC: wound on right heel  HPI: Pt says that he has a wound on his right heel.  He says that he has no pain or irritation but the callous has formed an ulcer area.  NO fever or chills or drainage.    Allergies  Allergen Reactions  . Crestor (Rosuvastatin) Other (See Comments)    "Makes my legs hurt" - tolerates simvastatin    Past Medical History  Diagnosis Date  . ELECTROCARDIOGRAM, ABNORMAL   . Coronary artery disease   . Anginal pain   . Hypertension   . GERD (gastroesophageal reflux disease)   . High cholesterol   . Peripheral vascular disease     LLE  . Myocardial infarction 10/2005  . Asthma     "had it; outgrew it"  . Pneumonia 1990's  . Shortness of breath 09/18/11    "@ rest; lying down; w/exertion"  . Type I diabetes mellitus   . Lower GI bleed 09/18/2011    "clots and everything; not lately"  . Headache 09/18/2011    "maybe twice/wk; pressure on the brain"  . Seizures 09/18/2011    "blanks out on me"/wife's report  . Chronic lower back pain   . Gout   . Anxiety    Current Outpatient Prescriptions on File Prior to Visit  Medication Sig Dispense Refill  . clopidogrel (PLAVIX) 75 MG tablet Take 1 tablet (75 mg total) by mouth daily with breakfast.  30 tablet  6  . diazepam (VALIUM) 5 MG tablet Take 1 tablet (5 mg total) by mouth every 6 (six) hours as needed for anxiety (spasms).  10 tablet  0  . fish oil-omega-3 fatty acids 1000 MG capsule Take 1 g by mouth 2 (two) times daily.      Marland Kitchen HYDROcodone-acetaminophen (NORCO/VICODIN) 5-325 MG per tablet 1 to 2 tabs every 4 to 6 hours as needed for pain.  20 tablet  0  . lisinopril (PRINIVIL,ZESTRIL) 10 MG tablet Take 1 tablet (10 mg total) by mouth daily.  30 tablet  11  . metFORMIN (GLUCOPHAGE) 500 MG tablet Take 1 tablet (500 mg total) by mouth 2 (two) times daily with a meal.  60 tablet  4  . metoprolol succinate (TOPROL-XL) 50 MG 24 hr tablet  Take 1 tablet (50 mg total) by mouth daily. Take with or immediately following a meal.  30 tablet  6  . nitroGLYCERIN (NITROSTAT) 0.4 MG SL tablet Place 1 tablet (0.4 mg total) under the tongue every 5 (five) minutes x 3 doses as needed for chest pain.  25 tablet  1  . ranitidine (ZANTAC) 150 MG capsule Take 1 capsule (150 mg total) by mouth 2 (two) times daily.  60 capsule  4  . sildenafil (VIAGRA) 25 MG tablet Take 1 tablet (25 mg total) by mouth daily as needed for erectile dysfunction.  6 tablet  0  . [DISCONTINUED] omeprazole (PRILOSEC) 20 MG capsule Take 20-40 mg by mouth daily.       No current facility-administered medications on file prior to visit.   No family history on file. History   Social History  . Marital Status: Married    Spouse Name: N/A    Number of Children: N/A  . Years of Education: N/A   Occupational History  . Not on file.   Social History Main Topics  . Smoking status: Former Smoker -- 0.50 packs/day for 20 years  Types: Cigarettes    Quit date: 11/02/2005  . Smokeless tobacco: Never Used  . Alcohol Use: No     Comment: 09/18/2011 "twice a month; 6 shots at a time"  . Drug Use: Yes    Special: Cocaine, Marijuana, "Crack" cocaine     Comment: 09/18/2011 quit "06/11/2003"  . Sexually Active: Yes   Other Topics Concern  . Not on file   Social History Narrative  . No narrative on file    Review of Systems  Constitutional: Negative for fever, chills, diaphoresis, activity change, appetite change and fatigue.  HENT: Negative for ear pain, nosebleeds, congestion, facial swelling, rhinorrhea, neck pain, neck stiffness and ear discharge.   Eyes: Negative for pain, discharge, redness, itching and visual disturbance.  Respiratory: Negative for cough, choking, chest tightness, shortness of breath, wheezing and stridor.   Cardiovascular: Negative for chest pain, palpitations and leg swelling.  Gastrointestinal: Negative for abdominal distention.   Genitourinary: Negative for dysuria, urgency, frequency, hematuria, flank pain, decreased urine volume, difficulty urinating and dyspareunia.  Musculoskeletal: Negative for back pain, joint swelling, arthralgias and gait problem.  Neurological: Negative for dizziness, tremors, seizures, syncope, facial asymmetry, speech difficulty, weakness, light-headedness, numbness and headaches.  Hematological: Negative for adenopathy. Does not bruise/bleed easily.  Psychiatric/Behavioral: Negative for hallucinations, behavioral problems, confusion, dysphoric mood, decreased concentration and agitation.    Objective:   Filed Vitals:   06/20/12 1132  BP: 115/74  Pulse: 54  Temp: 98 F (36.7 C)  Resp: 18    Physical Exam  Constitutional: Appears well-developed and well-nourished. No distress.  HENT: Normocephalic. External right and left ear normal. Oropharynx is clear and moist.  Eyes: Conjunctivae and EOM are normal. PERRLA, no scleral icterus.  Neck: Normal ROM. Neck supple. No JVD. No tracheal deviation. No thyromegaly.  CVS: RRR, S1/S2 +, no murmurs, no gallops, no carotid bruit.  Pulmonary: Effort and breath sounds normal, no stridor, rhonchi, wheezes, rales.  Abdominal: Soft. BS +,  no distension, tenderness, rebound or guarding.  Musculoskeletal: large blister on right heel, nonruptured with ulcer nonopened   Lymphadenopathy: No lymphadenopathy noted, cervical, inguinal. Neuro: Alert. Normal reflexes, muscle tone coordination. No cranial nerve deficit. Skin: Skin is warm and dry. No rash noted. Not diaphoretic. No erythema. No pallor.  Psychiatric: Normal mood and affect. Behavior, judgment, thought content normal.   Lab Results  Component Value Date   WBC 4.8 12/24/2011   HGB 13.0 12/24/2011   HCT 39.2 12/24/2011   MCV 84.5 12/24/2011   PLT 244 12/24/2011   Lab Results  Component Value Date   CREATININE 0.99 03/10/2012   BUN 11 03/10/2012   NA 140 03/10/2012   K 4.1 03/10/2012   CL  104 03/10/2012   CO2 26 03/10/2012    Lab Results  Component Value Date   HGBA1C 6.7* 03/10/2012   Lipid Panel     Component Value Date/Time   CHOL 265* 03/10/2012 1225   TRIG 117 03/10/2012 1225   HDL 149 03/10/2012 1225   CHOLHDL 1.8 03/10/2012 1225   VLDL 23 03/10/2012 1225   LDLCALC 93 03/10/2012 1225       Assessment and plan:   Patient Active Problem List   Diagnosis Date Noted  . Erectile dysfunction associated with type 2 diabetes mellitus 03/10/2012  . CAD (coronary artery disease) 01/08/2012  . Pancreatitis 01/08/2012  . Unstable angina 09/19/2011  . Diabetes mellitus type II 09/09/2011  . Mixed hyperlipidemia 09/09/2011  . HTN (hypertension) 09/09/2011  . ELECTROCARDIOGRAM,  ABNORMAL 12/12/2008  . CHEST PAIN 12/11/2008   Pt has a callous on his right heel.   Will refer to wound care clinic for further evaluation and refer to podiatry. Encouraged heel protection and wound protection Diabetes under good control at this time  Follow up recheck wound in 2 weeks in medical clinic   Rodney Langton, MD, CDE, FAAFP [facility] [place], [place]

## 2012-06-20 NOTE — Patient Instructions (Addendum)
Protect your heel callous and ulcer at all times No open toe shoes  Diabetes and Foot Care Diabetes may cause you to have a poor blood supply (circulation) to your legs and feet. Because of this, the skin may be thinner, break easier, and heal more slowly. You also may have nerve damage in your legs and feet causing decreased feeling. You may not notice minor injuries to your feet that could lead to serious problems or infections. Taking care of your feet is one of the most important things you can do for yourself.  HOME CARE INSTRUCTIONS  Do not go barefoot. Bare feet are easily injured.  Check your feet daily for blisters, cuts, and redness.  Wash your feet with warm water (not hot) and mild soap. Pat your feet and between your toes until completely dry.  Apply a moisturizing lotion that does not contain alcohol or petroleum jelly to the dry skin on your feet and to dry brittle toenails. Do not put it between your toes.  Trim your toenails straight across. Do not dig under them or around the cuticle.  Do not cut corns or calluses, or try to remove them with medicine.  Wear clean cotton socks or stockings every day. Make sure they are not too tight. Do not wear knee high stockings since they may decrease blood flow to your legs.  Wear leather shoes that fit properly and have enough cushioning. To break in new shoes, wear them just a few hours a day to avoid injuring your feet.  Wear shoes at all times, even in the house.  Do not cross your legs. This may decrease the blood flow to your feet.  If you find a minor scrape, cut, or break in the skin on your feet, keep it and the skin around it clean and dry. These areas may be cleansed with mild soap and water. Do not use peroxide, alcohol, iodine or Merthiolate.  When you remove an adhesive bandage, be sure not to harm the skin around it.  If you have a wound, look at it several times a day to make sure it is healing.  Do not use  heating pads or hot water bottles. Burns can occur. If you have lost feeling in your feet or legs, you may not know it is happening until it is too late.  Report any cuts, sores or bruises to your caregiver. Do not wait! SEEK MEDICAL CARE IF:   You have an injury that is not healing or you notice redness, numbness, burning, or tingling.  Your feet always feel cold.  You have pain or cramps in your legs and feet. SEEK IMMEDIATE MEDICAL CARE IF:   There is increasing redness, swelling, or increasing pain in the wound.  There is a red line that goes up your leg.  Pus is coming from a wound.  You develop an unexplained oral temperature above 102 F (38.9 C), or as your caregiver suggests.  You notice a bad smell coming from an ulcer or wound. MAKE SURE YOU:   Understand these instructions.  Will watch your condition.  Will get help right away if you are not doing well or get worse. Document Released: 01/03/2000 Document Revised: 03/30/2011 Document Reviewed: 07/11/2008 [facility] Patient Information 2014 [place], [place].

## 2012-06-20 NOTE — Progress Notes (Signed)
Patient states has history of DM Has a sore to his right foot that is not healing

## 2012-06-27 ENCOUNTER — Telehealth: Payer: Self-pay | Admitting: Family Medicine

## 2012-06-27 NOTE — Telephone Encounter (Signed)
Pt says he does not know where to go for referral.  Not sure exactly which referral he has an appointment.  Please f/u with pt.

## 2012-06-29 NOTE — Telephone Encounter (Signed)
I called Willie Walker but he is unavailable to take calls . I leave a message in his wife number to return my called .

## 2012-07-05 ENCOUNTER — Ambulatory Visit: Payer: Medicaid Other

## 2012-07-07 ENCOUNTER — Encounter (HOSPITAL_BASED_OUTPATIENT_CLINIC_OR_DEPARTMENT_OTHER): Payer: Medicaid Other

## 2012-07-18 ENCOUNTER — Other Ambulatory Visit: Payer: Self-pay | Admitting: Cardiovascular Disease

## 2012-07-28 ENCOUNTER — Encounter: Payer: Self-pay | Admitting: Family Medicine

## 2012-07-28 DIAGNOSIS — M5137 Other intervertebral disc degeneration, lumbosacral region: Secondary | ICD-10-CM | POA: Insufficient documentation

## 2012-07-28 DIAGNOSIS — L97509 Non-pressure chronic ulcer of other part of unspecified foot with unspecified severity: Secondary | ICD-10-CM

## 2012-07-28 DIAGNOSIS — E11621 Type 2 diabetes mellitus with foot ulcer: Secondary | ICD-10-CM | POA: Insufficient documentation

## 2012-07-28 DIAGNOSIS — E1142 Type 2 diabetes mellitus with diabetic polyneuropathy: Secondary | ICD-10-CM | POA: Insufficient documentation

## 2012-08-18 ENCOUNTER — Other Ambulatory Visit: Payer: Self-pay | Admitting: *Deleted

## 2012-08-18 MED ORDER — TOPROL XL 50 MG PO TB24: ORAL_TABLET | ORAL | Status: DC

## 2012-09-09 ENCOUNTER — Ambulatory Visit: Payer: Medicaid Other | Admitting: Cardiovascular Disease

## 2012-10-05 ENCOUNTER — Encounter (INDEPENDENT_AMBULATORY_CARE_PROVIDER_SITE_OTHER): Payer: Self-pay

## 2012-10-05 DIAGNOSIS — R0989 Other specified symptoms and signs involving the circulatory and respiratory systems: Secondary | ICD-10-CM

## 2012-10-24 ENCOUNTER — Ambulatory Visit (INDEPENDENT_AMBULATORY_CARE_PROVIDER_SITE_OTHER): Payer: Medicaid Other | Admitting: Cardiovascular Disease

## 2012-10-24 ENCOUNTER — Encounter: Payer: Self-pay | Admitting: Cardiovascular Disease

## 2012-10-24 ENCOUNTER — Other Ambulatory Visit: Payer: Self-pay | Admitting: *Deleted

## 2012-10-24 VITALS — BP 104/78 | HR 68 | Ht 71.0 in | Wt 158.8 lb

## 2012-10-24 DIAGNOSIS — I2510 Atherosclerotic heart disease of native coronary artery without angina pectoris: Secondary | ICD-10-CM

## 2012-10-24 DIAGNOSIS — R079 Chest pain, unspecified: Secondary | ICD-10-CM

## 2012-10-24 DIAGNOSIS — E119 Type 2 diabetes mellitus without complications: Secondary | ICD-10-CM

## 2012-10-24 DIAGNOSIS — E782 Mixed hyperlipidemia: Secondary | ICD-10-CM

## 2012-10-24 DIAGNOSIS — K859 Acute pancreatitis without necrosis or infection, unspecified: Secondary | ICD-10-CM

## 2012-10-24 MED ORDER — CLOPIDOGREL BISULFATE 75 MG PO TABS: 75.0000 mg | ORAL_TABLET | Freq: Every day | ORAL | Status: DC

## 2012-10-24 NOTE — Assessment & Plan Note (Signed)
Cholesterol is at goal.  Continue current dose of statin and diet Rx.  No myalgias or side effects.  F/U  LFT's in 6 months. Lab Results  Component Value Date   LDLCALC 93 03/10/2012

## 2012-10-24 NOTE — Assessment & Plan Note (Signed)
Well controlled.  Continue current medications and low sodium Dash type diet.    

## 2012-10-24 NOTE — Assessment & Plan Note (Signed)
Continue Plavix  Some new chest pain  F/U stress echo givne history of full metal jacket to RCA

## 2012-10-24 NOTE — Assessment & Plan Note (Signed)
No abdominal pain denies ETOH  Stable

## 2012-10-24 NOTE — Progress Notes (Signed)
Patient ID: Willie Walker, male   DOB: 06-06-1965, 47 y.o.   MRN: [id number] 47 yo with CAD Multiple stents placed in [place] with full metal jacket to RCA and stents in IM and circuflex Cath 8/29  Cardiac Catheterization, 09/17/11:  Coronary Arteries:  Right dominant with no anomalies  LM: 30% distal   LAD: proximal- normal, mid-60% diffuse, distal-40% multiple discrete IM- Large vessel 30% instent restenosis D1- 40-50% multiple discrete lesions  Circumflex: Single OM/AV groove branch 95% instent restenosis  RCA: Likely previously disected during attempted PCI with full metal jacket 90% proximal instent restonosis. Diffuse 80% instent restenosis distally extending into PDA  And large RV branch  Ventriculography: EF: 60%%, No RWMA;s  Had cutting balloon PCI with Dr [place] to proximal and mid RCA and circumflex.  Lesion Data:  Vessel: Mid RCA  Percent stenosis (pre): 90%  TIMI-flow (pre): 3  PCI: 3.0 mm cutting balloon  Percent stenosis (post): 0%  TIMI-flow (post): 3  Vessel: Mid LCx  Percent stenoses (pre): 95%  TIMI flow (pre): 3  PCI: 3.0 mm cutting balloon  Percent stenoses (post): 0%  TIMI flow (post): 3   Has had a couple of episodes of SSCP with exertion.  Denies recurrent ETOH  Has not taken nitro no rest pain.  Has been smoking marajuana   ROS: Denies fever, malais, weight loss, blurry vision, decreased visual acuity, cough, sputum, SOB, hemoptysis, pleuritic pain, palpitaitons, heartburn, abdominal pain, melena, lower extremity edema, claudication, or rash.  All other systems reviewed and negative  General: Affect appropriate Thin talkative black male  HEENT: normal Neck supple with no adenopathy JVP normal no bruits no thyromegaly Lungs clear with no wheezing and good diaphragmatic motion Heart:  S1/S2 no murmur, no rub, gallop or click PMI normal Abdomen: benighn, BS positve, no tenderness, no AAA no bruit.  No HSM or HJR Distal pulses intact with no  bruits No edema Neuro non-focal Skin warm and dry No muscular weakness   Current Outpatient Prescriptions  Medication Sig Dispense Refill  . fish oil-omega-3 fatty acids 1000 MG capsule Take 1 g by mouth 2 (two) times daily.      Marland Kitchen HYDROcodone-acetaminophen (NORCO/VICODIN) 5-325 MG per tablet 1 to 2 tabs every 4 to 6 hours as needed for pain.  20 tablet  0  . lisinopril (PRINIVIL,ZESTRIL) 10 MG tablet Take 1 tablet (10 mg total) by mouth daily.  30 tablet  11  . metFORMIN (GLUCOPHAGE) 500 MG tablet Take 1 tablet (500 mg total) by mouth 2 (two) times daily with a meal.  60 tablet  5  . ranitidine (ZANTAC) 150 MG capsule Take 1 capsule (150 mg total) by mouth 2 (two) times daily.  60 capsule  4  . STUDY MEDICATION       . TOPROL XL 50 MG 24 hr tablet TAKE ONE TABLET BY MOUTH EVERY DAY WITH MEALS OR  IMMEDIATELY  FOLLOWING  A  MEAL  30 tablet  3  . clopidogrel (PLAVIX) 75 MG tablet Take 1 tablet (75 mg total) by mouth daily with breakfast.  30 tablet  6  . nitroGLYCERIN (NITROSTAT) 0.4 MG SL tablet Place 1 tablet (0.4 mg total) under the tongue every 5 (five) minutes x 3 doses as needed for chest pain.  25 tablet  1  . [DISCONTINUED] omeprazole (PRILOSEC) 20 MG capsule Take 20-40 mg by mouth daily.       No current facility-administered medications for this visit.    Allergies  Crestor  Electrocardiogram:  12/24/11  SR rate 58 normal ECG   Assessment and Plan

## 2012-10-24 NOTE — Assessment & Plan Note (Signed)
Discussed low carb diet.  Target hemoglobin A1c is 6.5 or less.  Continue current medications.  

## 2012-10-24 NOTE — Patient Instructions (Signed)
Your physician has requested that you have a stress echocardiogram. For further information please visit [email or web address] Please follow instruction sheet as given.   Your physician wants you to follow-up in: 1 year You will receive a reminder letter in the mail two months in advance. If you don't receive a letter, please call our office to schedule the follow-up appointment.  Your physician recommends that you continue on your current medications as directed. Please refer to the Current Medication list given to you today.

## 2012-11-11 ENCOUNTER — Other Ambulatory Visit (HOSPITAL_COMMUNITY): Payer: Medicaid Other

## 2012-11-18 ENCOUNTER — Encounter (HOSPITAL_COMMUNITY): Payer: Self-pay | Admitting: Cardiovascular Disease

## 2012-11-18 ENCOUNTER — Other Ambulatory Visit (HOSPITAL_COMMUNITY): Payer: Medicaid Other

## 2012-12-20 ENCOUNTER — Ambulatory Visit: Payer: Medicaid Other | Attending: Internal Medicine | Admitting: Internal Medicine

## 2012-12-20 VITALS — BP 114/80 | HR 49 | Temp 98.7°F | Resp 14 | Ht 71.0 in | Wt 155.8 lb

## 2012-12-20 DIAGNOSIS — E119 Type 2 diabetes mellitus without complications: Secondary | ICD-10-CM

## 2012-12-20 LAB — COMPLETE METABOLIC PANEL WITH GFR
Alkaline Phosphatase: 60 U/L (ref 39–117)
BUN: 25 mg/dL — ABNORMAL HIGH (ref 6–23)
CO2: 25 mEq/L (ref 19–32)
Creat: 1.43 mg/dL — ABNORMAL HIGH (ref 0.50–1.35)
GFR, Est African American: 66 mL/min
GFR, Est Non African American: 57 mL/min — ABNORMAL LOW
Glucose, Bld: 78 mg/dL (ref 70–99)
Sodium: 138 mEq/L (ref 135–145)
Total Bilirubin: 0.2 mg/dL — ABNORMAL LOW (ref 0.3–1.2)
Total Protein: 7.6 g/dL (ref 6.0–8.3)

## 2012-12-20 MED ORDER — PREGABALIN 100 MG PO CAPS: 100.0000 mg | ORAL_CAPSULE | Freq: Two times a day (BID) | ORAL | Status: DC

## 2012-12-20 NOTE — Progress Notes (Unsigned)
Patient ID: CYLAS FALZONE, male   DOB: [birth year], 47 y.o.   MRN: [id number]  Patient Demographics  Willie Walker, is a 47 y.o. male  AOZ:[id number]  NGE:[id number]  DOB - 12-Aug-1965  Chief Complaint  Patient presents with  . office visit        Subjective:   Willie Walker with History of hypertension, diabetes mellitus type 2, CAD, is here for routine followup his only complaint is left subscapular area dull burning pain for the last few months, pain is nonradiating, no rash no injury to that area, denies any joint pains or aches. No fever chills. Never had herpes etc. No other subjective complaints  Denies any subjective complaints except as above, no active headache, no chest abdominal pain at this time, not short of breath. No focal weakness which is new.    Objective:    Patient Active Problem List   Diagnosis Date Noted  . Polyneuropathy in diabetes(357.2) 07/28/2012  . Diabetic foot ulcer associated with type 2 diabetes mellitus 07/28/2012  . Disc disease, degenerative, lumbar or lumbosacral 07/28/2012  . Heel ulcer, right 06/20/2012  . Type II or unspecified type diabetes mellitus without mention of complication, not stated as uncontrolled 06/20/2012  . Erectile dysfunction associated with type 2 diabetes mellitus 03/10/2012  . CAD (coronary artery disease) 01/08/2012  . Pancreatitis 01/08/2012  . Unstable angina 09/19/2011  . Type 2 diabetes mellitus 09/09/2011  . Mixed hyperlipidemia 09/09/2011  . HTN (hypertension) 09/09/2011  . ELECTROCARDIOGRAM, ABNORMAL 12/12/2008     Filed Vitals:   12/20/12 1114  BP: 114/80  Pulse: 49  Temp: 98.7 F (37.1 C)  TempSrc: Oral  Resp: 14  Height: 5\' 11"  (1.803 m)  Weight: 155 lb 12.8 oz (70.67 kg)  SpO2: 100%     Exam   Awake Alert, Oriented X 3, No new F.N deficits, Normal affect [place].AT,PERRAL Supple Neck,No JVD, No cervical lymphadenopathy appriciated.  Symmetrical Chest wall movement, Good air movement  bilaterally, CTAB RRR,No Gallops,Rubs or new Murmurs, No Parasternal Heave +ve B.Sounds, Abd Soft, Non tender, No organomegaly appriciated, No rebound - guarding or rigidity. No Cyanosis, Clubbing or edema, No new Rash or bruise, no rash under the left shoulder blade or flank area    Data Review   Lab Results  Component Value Date   WBC 4.8 12/24/2011   HGB 13.0 12/24/2011   HCT 39.2 12/24/2011   MCV 84.5 12/24/2011   PLT 244 12/24/2011      Chemistry      Component Value Date/Time   NA 140 03/10/2012 1225   K 4.1 03/10/2012 1225   CL 104 03/10/2012 1225   CO2 26 03/10/2012 1225   BUN 11 03/10/2012 1225   CREATININE 0.99 03/10/2012 1225      Component Value Date/Time   CALCIUM 9.6 03/10/2012 1225   ALKPHOS 85 12/24/2011 0951   AST 28 12/24/2011 0951   ALT 28 12/24/2011 0951   BILITOT 0.2* 12/24/2011 0951       Lab Results  Component Value Date   HGBA1C 6.7* 03/10/2012    Lab Results  Component Value Date   CHOL 265* 03/10/2012   HDL 149 03/10/2012   LDLCALC 93 03/10/2012   TRIG 117 03/10/2012   CHOLHDL 1.8 03/10/2012    Lab Results  Component Value Date   TSH 1.983 04/19/2007    Lab Results  Component Value Date   PSA 0.62 05/23/2009   PSA 0.48 04/19/2007      Prior to  Admission medications   Medication Sig Start Date End Date Taking? Authorizing Provider  clopidogrel (PLAVIX) 75 MG tablet Take 1 tablet (75 mg total) by mouth daily with breakfast. 10/24/12 10/24/13 Yes Wendall Stade, MD  fish oil-omega-3 fatty acids 1000 MG capsule Take 1 g by mouth 2 (two) times daily.   Yes Historical Provider, MD  lisinopril (PRINIVIL,ZESTRIL) 10 MG tablet Take 1 tablet (10 mg total) by mouth daily. 01/08/12  Yes Wendall Stade, MD  metFORMIN (GLUCOPHAGE) 500 MG tablet Take 1 tablet (500 mg total) by mouth 2 (two) times daily with a meal. 06/20/12  Yes Clanford Cyndie Mull, MD  STUDY MEDICATION    Yes Historical Provider, MD  TOPROL XL 50 MG 24 hr tablet TAKE ONE TABLET BY MOUTH EVERY DAY  WITH MEALS OR  IMMEDIATELY  FOLLOWING  A  MEAL 08/18/12  Yes Wendall Stade, MD  HYDROcodone-acetaminophen (NORCO/VICODIN) 5-325 MG per tablet 1 to 2 tabs every 4 to 6 hours as needed for pain. 03/01/12   Reuben Likes, MD  nitroGLYCERIN (NITROSTAT) 0.4 MG SL tablet Place 1 tablet (0.4 mg total) under the tongue every 5 (five) minutes x 3 doses as needed for chest pain. 09/19/11 09/18/12  Rande Brunt, PA-C  pregabalin (LYRICA) 100 MG capsule Take 1 capsule (100 mg total) by mouth 2 (two) times daily. 12/20/12   Leroy Sea, MD  ranitidine (ZANTAC) 150 MG capsule Take 1 capsule (150 mg total) by mouth 2 (two) times daily. 12/29/11   Clanford Cyndie Mull, MD     Assessment & Plan    Left subscapular pain also in his lower back for the last 3-4 months. It's burning in sensation in sounds neuropathic. No associated symptoms, no rash, no aggravating or relieving factors. Pain is dull. Will give him a trial of Levitra, check B12 TSH and monitor.   Hypertension. Good control continue Toprol-XL.   History of CAD. Follows with Dr. Eden Emms cardiologist, no acute issues continue outpatient followup with him.    He is concerned that at times his eyes turned yellow, unremarkable today, will check CMP.   Type 2 diabetes mellitus. We'll check A1c. For now on Glucophage 500 twice a day. Reinforced low carb diet with 2 g a day salt restriction      Leroy Sea M.D on 12/20/2012 at 11:41 AM    Patient was given clear instructions to go to ER or return to the clinic if symptoms don't improve, worsen or new problems develop. Patient verbalized understanding. Patient was told to call to get lab results if hasn't heard anything in the next week.

## 2012-12-20 NOTE — Progress Notes (Unsigned)
Pt is here for an office visit; due for HBA1c. Pt complains of pain from middle of back through abdomen; discomfort in back, started as an itch x4 weeks. Pain level on 3 toady, constant.

## 2012-12-21 LAB — TSH: TSH: 0.95 u[IU]/mL (ref 0.350–4.500)

## 2012-12-25 ENCOUNTER — Other Ambulatory Visit: Payer: Self-pay | Admitting: Cardiovascular Disease

## 2013-01-02 ENCOUNTER — Other Ambulatory Visit: Payer: Self-pay | Admitting: Emergency Medicine

## 2013-01-02 MED ORDER — RANITIDINE HCL 150 MG PO CAPS: 150.0000 mg | ORAL_CAPSULE | Freq: Two times a day (BID) | ORAL | Status: DC

## 2013-01-10 ENCOUNTER — Telehealth: Payer: Self-pay | Admitting: *Deleted

## 2013-01-10 NOTE — Progress Notes (Signed)
Quick Note:  Patient to come back immediately to get a repeat BMP checked. Creatinine was mildly elevated last check ______

## 2013-01-10 NOTE — Telephone Encounter (Signed)
Contacted pt to inform him to come back immediately to get a repeat BMP checked. Creatinine was mildly elevated last check.  Pt already has a f/u appointment with Dr. Orpah Cobb 01/26/13. Pt will still come into the clinic before that schedules date.

## 2013-01-26 ENCOUNTER — Ambulatory Visit: Payer: Medicaid Other | Admitting: Internal Medicine

## 2013-01-26 ENCOUNTER — Other Ambulatory Visit: Payer: Self-pay | Admitting: Cardiovascular Disease

## 2013-01-30 ENCOUNTER — Other Ambulatory Visit: Payer: Self-pay | Admitting: Cardiovascular Disease

## 2013-01-31 ENCOUNTER — Other Ambulatory Visit: Payer: Self-pay | Admitting: *Deleted

## 2013-01-31 ENCOUNTER — Telehealth: Payer: Self-pay | Admitting: *Deleted

## 2013-01-31 NOTE — Telephone Encounter (Signed)
Pt called requesting refills on his medications. The medications were already ordered and are waiting on him at his pharmacy.

## 2013-02-09 ENCOUNTER — Ambulatory Visit: Payer: Medicaid Other | Attending: Internal Medicine | Admitting: Internal Medicine

## 2013-02-09 ENCOUNTER — Encounter: Payer: Self-pay | Admitting: Internal Medicine

## 2013-02-09 VITALS — BP 125/84 | HR 71 | Temp 98.9°F | Resp 14 | Ht 71.0 in | Wt 158.4 lb

## 2013-02-09 DIAGNOSIS — R079 Chest pain, unspecified: Secondary | ICD-10-CM | POA: Insufficient documentation

## 2013-02-09 DIAGNOSIS — I25810 Atherosclerosis of coronary artery bypass graft(s) without angina pectoris: Secondary | ICD-10-CM

## 2013-02-09 DIAGNOSIS — I10 Essential (primary) hypertension: Secondary | ICD-10-CM | POA: Insufficient documentation

## 2013-02-09 DIAGNOSIS — E119 Type 2 diabetes mellitus without complications: Secondary | ICD-10-CM | POA: Insufficient documentation

## 2013-02-09 DIAGNOSIS — H9190 Unspecified hearing loss, unspecified ear: Secondary | ICD-10-CM

## 2013-02-09 LAB — LIPID PANEL
Cholesterol: 264 mg/dL — ABNORMAL HIGH (ref 0–200)
HDL: 132 mg/dL (ref >39)
LDL Cholesterol: 94 mg/dL (ref 0–99)
TRIGLYCERIDES: 189 mg/dL — AB (ref <150)
Total CHOL/HDL Ratio: 2 Ratio
VLDL: 38 mg/dL (ref 0–40)

## 2013-02-09 LAB — CBC WITH DIFFERENTIAL/PLATELET
BASOS ABS: 0 10*3/uL (ref 0.0–0.1)
Basophils Relative: 1 % (ref 0–1)
Eosinophils Absolute: 0.2 10*3/uL (ref 0.0–0.7)
Eosinophils Relative: 4 % (ref 0–5)
HCT: 40.8 % (ref 39.0–52.0)
Hemoglobin: 14.3 g/dL (ref 13.0–17.0)
Lymphocytes Relative: 50 % — ABNORMAL HIGH (ref 12–46)
Lymphs Abs: 2.2 10*3/uL (ref 0.7–4.0)
MCH: 29.5 pg (ref 26.0–34.0)
MCHC: 35 g/dL (ref 30.0–36.0)
MCV: 84.1 fL (ref 78.0–100.0)
Monocytes Absolute: 0.4 10*3/uL (ref 0.1–1.0)
Monocytes Relative: 10 % (ref 3–12)
NEUTROS ABS: 1.6 10*3/uL — AB (ref 1.7–7.7)
NEUTROS PCT: 35 % — AB (ref 43–77)
PLATELETS: 281 10*3/uL (ref 150–400)
RBC: 4.85 MIL/uL (ref 4.22–5.81)
RDW: 14.8 % (ref 11.5–15.5)
WBC: 4.4 10*3/uL (ref 4.0–10.5)

## 2013-02-09 LAB — COMPLETE METABOLIC PANEL WITH GFR
ALT: 16 U/L (ref 0–53)
AST: 26 U/L (ref 0–37)
Albumin: 4.4 g/dL (ref 3.5–5.2)
Alkaline Phosphatase: 70 U/L (ref 39–117)
BILIRUBIN TOTAL: 0.3 mg/dL (ref 0.3–1.2)
BUN: 14 mg/dL (ref 6–23)
CO2: 29 meq/L (ref 19–32)
CREATININE: 1.16 mg/dL (ref 0.50–1.35)
Calcium: 9.9 mg/dL (ref 8.4–10.5)
Chloride: 104 mEq/L (ref 96–112)
GFR, EST AFRICAN AMERICAN: 85 mL/min
GFR, Est Non African American: 74 mL/min
GLUCOSE: 114 mg/dL — AB (ref 70–99)
Potassium: 4.8 mEq/L (ref 3.5–5.3)
Sodium: 139 mEq/L (ref 135–145)
Total Protein: 7.7 g/dL (ref 6.0–8.3)

## 2013-02-09 LAB — POCT GLYCOSYLATED HEMOGLOBIN (HGB A1C): Hemoglobin A1C: 5.7

## 2013-02-09 MED ORDER — CYCLOBENZAPRINE HCL 5 MG PO TABS: 5.0000 mg | ORAL_TABLET | Freq: Three times a day (TID) | ORAL | Status: DC | PRN

## 2013-02-09 MED ORDER — TOPROL XL 50 MG PO TB24: 50.0000 mg | ORAL_TABLET | Freq: Every day | ORAL | Status: DC

## 2013-02-09 MED ORDER — ASPIRIN EC 81 MG PO TBEC: 81.0000 mg | DELAYED_RELEASE_TABLET | Freq: Every day | ORAL | Status: DC

## 2013-02-09 MED ORDER — LISINOPRIL 10 MG PO TABS: 10.0000 mg | ORAL_TABLET | Freq: Every day | ORAL | Status: DC

## 2013-02-09 MED ORDER — METFORMIN HCL 500 MG PO TABS: 500.0000 mg | ORAL_TABLET | Freq: Two times a day (BID) | ORAL | Status: DC

## 2013-02-09 MED ORDER — CLOPIDOGREL BISULFATE 75 MG PO TABS: 75.0000 mg | ORAL_TABLET | Freq: Every day | ORAL | Status: DC

## 2013-02-09 NOTE — Progress Notes (Signed)
Pt is here for a f/u for medication refill. Also needs medication for gout. Complains of gout flaring up. Also complains of Lt chest pain x2 weeks. Has a history of an heart attack. Pt has not been taking Plavix and Aspirin x15 days. Pt also expressed that he feels extreme anxiety and depression trying to deal with his personal issues.

## 2013-02-09 NOTE — Progress Notes (Signed)
Patient ID: Willie Walker, male   DOB: 11/11/1965, 48 y.o.   MRN: [id number]   CC:  HPI:  48 year old male, with CAD Multiple stents placed in [place] with full metal jacket to RCA and stents in IM and circuflex Cath 8/29 , EF of 60%, supposed to be on aspirin and Plavix, followed by Dr. Charlton HawsPeter nishan,, cardiology who comes in today with left-sided chest pain below his left scapula. Again a similar pain in December. He states that he had not taken his aspirin or Plavix for the last 3 weeks. He did not remember to call his cardiologist for a refill. He He described left-sided subscapular pain, on his visit on 12/20/12 and he was prescribed Lyrica for the pain. He has not noticed any change in his symptoms.  X-ray of his left rib and chest showed no rib fracture or pneumothorax, on 2/14 Pain is described as 5/10 without radiation, and preceeds the time but the patient stopped taking his Plavix, most likely musculoskeletal pain.  Allergies  Allergen Reactions  . Crestor [Rosuvastatin] Other (See Comments)    "Makes my legs hurt" - tolerates simvastatin    Past Medical History  Diagnosis Date  . ELECTROCARDIOGRAM, ABNORMAL   . Coronary artery disease   . Anginal pain   . Hypertension   . GERD (gastroesophageal reflux disease)   . High cholesterol   . Peripheral vascular disease     LLE  . Myocardial infarction 10/2005  . Asthma     "had it; outgrew it"  . Pneumonia 1990's  . Shortness of breath 09/18/11    "@ rest; lying down; w/exertion"  . Type I diabetes mellitus   . Lower GI bleed 09/18/2011    "clots and everything; not lately"  . Headache(784.0) 09/18/2011    "maybe twice/wk; pressure on the brain"  . Seizures 09/18/2011    "blanks out on me"/wife's report  . Chronic lower back pain   . Gout   . Anxiety    Current Outpatient Prescriptions on File Prior to Visit  Medication Sig Dispense Refill  . fish oil-omega-3 fatty acids 1000 MG capsule Take 1 g by mouth 2 (two) times  daily.      . ranitidine (ZANTAC) 150 MG capsule Take 1 capsule (150 mg total) by mouth 2 (two) times daily.  60 capsule  4  . TOPROL XL 50 MG 24 hr tablet TAKE ONE TABLET BY MOUTH ONCE DAILY WITH FOOD  30 tablet  0  . HYDROcodone-acetaminophen (NORCO/VICODIN) 5-325 MG per tablet 1 to 2 tabs every 4 to 6 hours as needed for pain.  20 tablet  0  . nitroGLYCERIN (NITROSTAT) 0.4 MG SL tablet Place 1 tablet (0.4 mg total) under the tongue every 5 (five) minutes x 3 doses as needed for chest pain.  25 tablet  1  . STUDY MEDICATION       . [DISCONTINUED] omeprazole (PRILOSEC) 20 MG capsule Take 20-40 mg by mouth daily.       No current facility-administered medications on file prior to visit.   Family History  Problem Relation Age of Onset  . Hypertension Mother   . Dementia Mother   . CVA Father    History   Social History  . Marital Status: Married    Spouse Name: N/A    Number of Children: N/A  . Years of Education: N/A   Occupational History  . Not on file.   Social History Main Topics  . Smoking status: Former  Smoker -- 0.50 packs/day for 20 years    Types: Cigarettes    Quit date: 11/02/2005  . Smokeless tobacco: Never Used  . Alcohol Use: No     Comment: 09/18/2011 "twice a month; 6 shots at a time"  . Drug Use: Yes    Special: Cocaine, Marijuana, "Crack" cocaine     Comment: 09/18/2011 quit "06/11/2003"  . Sexual Activity: Yes   Other Topics Concern  . Not on file   Social History Narrative  . No narrative on file    Review of Systems  Constitutional: Negative for fever, chills, diaphoresis, activity change, appetite change and fatigue.  HENT: Negative for ear pain, nosebleeds, congestion, facial swelling, rhinorrhea, neck pain, neck stiffness and ear discharge.   Eyes: Negative for pain, discharge, redness, itching and visual disturbance.  Respiratory: Negative for cough, choking, chest tightness, shortness of breath, wheezing and stridor.   Cardiovascular: Negative  for chest pain, palpitations and leg swelling.  Gastrointestinal: Negative for abdominal distention.  Genitourinary: Negative for dysuria, urgency, frequency, hematuria, flank pain, decreased urine volume, difficulty urinating and dyspareunia.  Musculoskeletal: Negative for back pain, joint swelling, arthralgias and gait problem.  Neurological: Negative for dizziness, tremors, seizures, syncope, facial asymmetry, speech difficulty, weakness, light-headedness, numbness and headaches.  Hematological: Negative for adenopathy. Does not bruise/bleed easily.  Psychiatric/Behavioral: Negative for hallucinations, behavioral problems, confusion, dysphoric mood, decreased concentration and agitation.    Objective:   Filed Vitals:   02/09/13 1443  BP: 125/84  Pulse: 71  Temp: 98.9 F (37.2 C)  Resp: 14    Physical Exam  Constitutional: Appears well-developed and well-nourished. No distress.  HENT: Normocephalic. External right and left ear normal. Oropharynx is clear and moist.  Eyes: Conjunctivae and EOM are normal. PERRLA, no scleral icterus.  Neck: Normal ROM. Neck supple. No JVD. No tracheal deviation. No thyromegaly.  CVS: RRR, S1/S2 +, no murmurs, no gallops, no carotid bruit.  Pulmonary: Effort and breath sounds normal, no stridor, rhonchi, wheezes, rales.  Abdominal: Soft. BS +,  no distension, tenderness, rebound or guarding.  Musculoskeletal: Normal range of motion. No edema and no tenderness.  Lymphadenopathy: No lymphadenopathy noted, cervical, inguinal. Neuro: Alert. Normal reflexes, muscle tone coordination. No cranial nerve deficit. Skin: Skin is warm and dry. No rash noted. Not diaphoretic. No erythema. No pallor.  Psychiatric: Normal mood and affect. Behavior, judgment, thought content normal.   Lab Results  Component Value Date   WBC 4.8 12/24/2011   HGB 13.0 12/24/2011   HCT 39.2 12/24/2011   MCV 84.5 12/24/2011   PLT 244 12/24/2011   Lab Results  Component Value Date    CREATININE 1.43* 12/20/2012   BUN 25* 12/20/2012   NA 138 12/20/2012   K 4.6 12/20/2012   CL 107 12/20/2012   CO2 25 12/20/2012    Lab Results  Component Value Date   HGBA1C 5.9 12/20/2012   Lipid Panel     Component Value Date/Time   CHOL 265* 03/10/2012 1225   TRIG 117 03/10/2012 1225   HDL 149 03/10/2012 1225   CHOLHDL 1.8 03/10/2012 1225   VLDL 23 03/10/2012 1225   LDLCALC 93 03/10/2012 1225       Assessment and plan:   Patient Active Problem List   Diagnosis Date Noted  . Polyneuropathy in diabetes(357.2) 07/28/2012  . Diabetic foot ulcer associated with type 2 diabetes mellitus 07/28/2012  . Disc disease, degenerative, lumbar or lumbosacral 07/28/2012  . Heel ulcer, right 06/20/2012  . Type II or  unspecified type diabetes mellitus without mention of complication, not stated as uncontrolled 06/20/2012  . Erectile dysfunction associated with type 2 diabetes mellitus 03/10/2012  . CAD (coronary artery disease) 01/08/2012  . Pancreatitis 01/08/2012  . Unstable angina 09/19/2011  . Type 2 diabetes mellitus 09/09/2011  . Mixed hyperlipidemia 09/09/2011  . HTN (hypertension) 09/09/2011  . ELECTROCARDIOGRAM, ABNORMAL 12/12/2008       Left-sided chest pain Most likely musculoskeletal Since patient has been off his Plavix we will do a troponin, d-dimer Obtain a chest x-ray and her MRI of the left shoulder Discontinue Lyrica Switch to Flexeril  Difficulty hearing He needs an ear lavage   Hypertension Refills on his medications  Elevated creatinine Repeat kidney function today baseline is around 1.0   Diabetes Refill Metformin, A1c today  Followup in 2-3 months  The patient was given clear instructions to go to ER or return to medical center if symptoms don't improve, worsen or new problems develop. The patient verbalized understanding. The patient was told to call to get any lab results if not heard anything in the next week.

## 2013-02-10 ENCOUNTER — Telehealth: Payer: Self-pay | Admitting: *Deleted

## 2013-02-10 LAB — TROPONIN I: Troponin I: 0.01 ng/mL (ref <0.06)

## 2013-02-10 LAB — D-DIMER, QUANTITATIVE (NOT AT ARMC): D DIMER QUANT: 0.33 ug{FEU}/mL (ref 0.00–0.48)

## 2013-02-10 MED ORDER — RANITIDINE HCL 150 MG PO CAPS: 150.0000 mg | ORAL_CAPSULE | Freq: Two times a day (BID) | ORAL | Status: DC

## 2013-02-10 NOTE — Telephone Encounter (Signed)
Message copied by Shavana Calder, [place] on Fri Feb 10, 2013  2:39 PM ------      Message from: Susie CassetteABROL MD, Germain OsgoodNAYANA      Created: Fri Feb 10, 2013 10:16 AM       All labs are normal ------

## 2013-02-10 NOTE — Telephone Encounter (Signed)
Needs a refill on acid reflex medication.

## 2013-02-10 NOTE — Telephone Encounter (Signed)
Contacted pt to inform him that his prescription for Zantac is at our pharmacy.

## 2013-02-10 NOTE — Addendum Note (Signed)
Addended by: Susie CassetteABROL MD, Germain OsgoodNAYANA on: 02/10/2013 02:52 PM   Modules accepted: Orders

## 2013-02-11 ENCOUNTER — Telehealth: Payer: Self-pay | Admitting: *Deleted

## 2013-02-11 NOTE — Telephone Encounter (Signed)
Unable to reach pt and leave a voicemail

## 2013-02-11 NOTE — Telephone Encounter (Signed)
Message copied by Ryna Beckstrom, [place] on Sat Feb 11, 2013  9:56 AM ------      Message from: Susie CassetteABROL MD, [facility]      Created: Fri Feb 10, 2013  5:07 PM       Notify patient of the labs are normal ------

## 2013-02-22 ENCOUNTER — Ambulatory Visit (HOSPITAL_COMMUNITY): Admission: RE | Admit: 2013-02-22 | Payer: Medicaid Other | Source: Ambulatory Visit

## 2013-04-10 ENCOUNTER — Ambulatory Visit: Payer: Medicaid Other

## 2013-04-11 ENCOUNTER — Other Ambulatory Visit: Payer: Self-pay | Admitting: *Deleted

## 2013-04-11 MED ORDER — SIMVASTATIN 40 MG PO TABS
40.0000 mg | ORAL_TABLET | Freq: Every day | ORAL | Status: DC
Start: 2013-04-11 — End: 2013-07-17

## 2013-04-13 ENCOUNTER — Telehealth: Payer: Self-pay | Admitting: *Deleted

## 2013-04-13 NOTE — Telephone Encounter (Signed)
Dr.Nishan was notified of patient abnormal CPK at 440. Pt is already off Zocor. We will hold Accelerate study drug for 8 weeks and then repeat CPK on 06/08/13. Patient was notified by phone to hold Zocor and Accelerate Study drug due to elevated CPK lab value.Patient verbalized understanding.

## 2013-04-14 ENCOUNTER — Telehealth: Payer: Self-pay | Admitting: *Deleted

## 2013-04-14 ENCOUNTER — Other Ambulatory Visit: Payer: Self-pay | Admitting: *Deleted

## 2013-04-14 NOTE — Telephone Encounter (Signed)
Message copied by Alois ClicheYORK, Vincie Linn E on Fri Apr 14, 2013  2:31 PM ------      Message from: Wendall StadeNISHAN, PETER C      Created: Tue Apr 11, 2013  9:51 AM       Call in script zocor 40 mg daily             ----- Message -----         From: Ennis FortsVivian Dobbins Garman, RN         Sent: 04/11/2013   9:34 AM           To: Wendall StadePeter C Nishan, MD, Dossie ArbourPatricia L Adelman, RN            I am sending message for you to [facility]. Pt needs to be on Zocor 40 mg qd and needs prescription.Call if you have any questions.       ------

## 2013-04-14 NOTE — Telephone Encounter (Signed)
CALLED PT  RE  MESSAGE  BELOW  PER PT   SPOKE WITH   VIVIAN FROM  RESEARCH  YESTERDAY   INFORMED PT  TO DISREGARD  CALL  FROM TODAY  AND  FOLLOW  INSTRUCTIONS FROM  YESTERDAY./CY

## 2013-04-27 ENCOUNTER — Encounter: Payer: Self-pay | Admitting: Cardiology

## 2013-04-28 ENCOUNTER — Encounter: Payer: Self-pay | Admitting: Cardiology

## 2013-06-08 ENCOUNTER — Encounter: Payer: Self-pay | Admitting: Cardiology

## 2013-06-09 ENCOUNTER — Telehealth: Payer: Self-pay | Admitting: *Deleted

## 2013-06-09 NOTE — Telephone Encounter (Signed)
F/U with Lorin Picket is fine If CPK remains elevated off study drug and statin may need muscle biopsy

## 2013-06-09 NOTE — Telephone Encounter (Signed)
I spoke with patient to let him know of elevated CK of 604 U/L.Patient has been off Zocor and Accelerate Study drug since 04/11/13. The last CK was 440 on 04/11/13.Dr. Jens Som asked patient follow-up with cardiologist for further evaluation of CK. Scherrie Bateman LPN asked we set up an appointment with PA as soon as possible Copies of lab work was given to [employer]. Patient has appointment with Tereso Newcomer on 06/13/13 at 9:10 . Patient made aware of appointment and confirmed could make appointment.

## 2013-06-13 ENCOUNTER — Encounter: Payer: Medicaid Other | Admitting: Physician Assistant

## 2013-06-13 NOTE — Progress Notes (Deleted)
Cardiology Office Note   Date:  06/13/2013   ID:  Willie HansenDaryle W Walker, DOB 05/23/1965, MRN [id number]  PCP:  Jeanann LewandowskyJEGEDE, OLUGBEMIGA, MD  Cardiologist:  Dr. Charlton HawsPeter Nishan      History of Present Illness: Willie Walker is a 48 y.o. male with a history of CAD status post multiple PCI or seizures ("full metal jacket to the RCA", stent to the IM, stent to the circumflex), HTN, HL, diabetes, seizure disorder.  Last seen by Dr. Charlton HawsPeter Nishan 10/2012.  ETT-Echo was recommended for f/u.  This has not been done.  [place].  CPK was noted to be 604 and he has remained off of Zocor and study drug since 04/11/13.  He was set up to see me today for further evaluation.   ***   Studies:  - LHC (08/2011):  Dist LM 30%, mid LAD 60%, dist LAD 40%, IM 30% ISR, D1 40-50%, CFX 95% ISR, prox RCA 90% ISR, dist RCA 80% ISR, EF 60%.   PCI:  Cutting balloon PTCA to mid RCA, cutting balloon PTCA to mid CFX.  - ETT-Echo (12/2008):  Submaximal exercise, no evidence of ischemia    Recent Labs: 12/20/2012: TSH 0.950  02/09/2013: ALT 16; Creatinine 1.16; HDL Cholesterol by NMR 132; Hemoglobin 14.3; LDL (calc) 94; Potassium 4.8   Wt Readings from Last 3 Encounters:  02/09/13 158 lb 6.4 oz (71.85 kg)  12/20/12 155 lb 12.8 oz (70.67 kg)  10/24/12 158 lb 12.8 oz (72.031 kg)     Past Medical History  Diagnosis Date  . ELECTROCARDIOGRAM, ABNORMAL   . Coronary artery disease   . Anginal pain   . Hypertension   . GERD (gastroesophageal reflux disease)   . High cholesterol   . Peripheral vascular disease     LLE  . Myocardial infarction 10/2005  . Asthma     "had it; outgrew it"  . Pneumonia 1990's  . Shortness of breath 09/18/11    "@ rest; lying down; w/exertion"  . Type I diabetes mellitus   . Lower GI bleed 09/18/2011    "clots and everything; not lately"  . Headache(784.0) 09/18/2011    "maybe twice/wk; pressure on the brain"  . Seizures 09/18/2011    "blanks out on me"/wife's report    . Chronic lower back pain   . Gout   . Anxiety     Current Outpatient Prescriptions  Medication Sig Dispense Refill  . aspirin EC 81 MG tablet Take 1 tablet (81 mg total) by mouth daily.  60 tablet  2  . clopidogrel (PLAVIX) 75 MG tablet Take 1 tablet (75 mg total) by mouth daily with breakfast.  30 tablet  6  . cyclobenzaprine (FLEXERIL) 5 MG tablet Take 1 tablet (5 mg total) by mouth 3 (three) times daily as needed for muscle spasms.  30 tablet  0  . fish oil-omega-3 fatty acids 1000 MG capsule Take 1 g by mouth 2 (two) times daily.      Marland Kitchen. HYDROcodone-acetaminophen (NORCO/VICODIN) 5-325 MG per tablet 1 to 2 tabs every 4 to 6 hours as needed for pain.  20 tablet  0  . lisinopril (PRINIVIL,ZESTRIL) 10 MG tablet Take 1 tablet (10 mg total) by mouth daily.  30 tablet  3  . metFORMIN (GLUCOPHAGE) 500 MG tablet Take 1 tablet (500 mg total) by mouth 2 (two) times daily with a meal.  60 tablet  5  . nitroGLYCERIN (NITROSTAT) 0.4 MG SL tablet Place 1 tablet (0.4  mg total) under the tongue every 5 (five) minutes x 3 doses as needed for chest pain.  25 tablet  1  . ranitidine (ZANTAC) 150 MG capsule Take 1 capsule (150 mg total) by mouth 2 (two) times daily.  60 capsule  4  . simvastatin (ZOCOR) 40 MG tablet Take 1 tablet (40 mg total) by mouth at bedtime.  30 tablet  6  . STUDY MEDICATION       . TOPROL XL 50 MG 24 hr tablet TAKE ONE TABLET BY MOUTH ONCE DAILY WITH FOOD  30 tablet  0  . TOPROL XL 50 MG 24 hr tablet Take 1 tablet (50 mg total) by mouth daily. Take with or immediately following a meal.  30 tablet  3  . [DISCONTINUED] omeprazole (PRILOSEC) 20 MG capsule Take 20-40 mg by mouth daily.       No current facility-administered medications for this visit.    Allergies:   Crestor   Social History:  The patient  reports that he quit smoking about 7 years ago. His smoking use included Cigarettes. He has a 10 pack-year smoking history. He has never used smokeless tobacco. He reports that he  uses illicit drugs (Cocaine, Marijuana, and "Crack" cocaine). He reports that he does not drink alcohol.   Family History:  The patient's family history includes CVA in his father; Dementia in his mother; Hypertension in his mother.   ROS:  Please see the history of present illness.   ***   All other systems reviewed and negative.   PHYSICAL EXAM: VS:  There were no vitals taken for this visit. Well nourished, well developed, in no acute distress HEENT: normal Neck: ***no JVD Cardiac:  normal S1, S2; ***RRR; no murmur Lungs:  ***clear to auscultation bilaterally, no wheezing, rhonchi or rales Abd: soft, nontender, no hepatomegaly Ext: ***no edema Skin: warm and dry Neuro:  CNs 2-12 intact, no focal abnormalities noted  EKG:  ***     ASSESSMENT AND PLAN:  Elevated CK  CAD (coronary artery disease)  HTN (hypertension)  Mixed hyperlipidemia ***   Signed, Brynda Rim, MHS 06/13/2013 8:37 AM    [facility] [address] [place], [place], [place]  [address] Phone: [phone]; Fax: [phone]

## 2013-06-13 NOTE — Progress Notes (Signed)
This encounter was created in error - please disregard.

## 2013-06-14 NOTE — Telephone Encounter (Signed)
He was a no show. Tereso Newcomer, PA-C   06/14/2013 10:26 PM

## 2013-06-21 NOTE — Telephone Encounter (Signed)
NOTED ./CY 

## 2013-06-29 ENCOUNTER — Telehealth: Payer: Self-pay | Admitting: *Deleted

## 2013-06-29 DIAGNOSIS — R748 Abnormal levels of other serum enzymes: Secondary | ICD-10-CM

## 2013-06-29 NOTE — Telephone Encounter (Signed)
PER  DR NISHAN   NEEDS  REPEAT  CPK    IN    8 WEEKS    PT  AWARE  WILL  COME IN Monday FOR  RECHECK .Zack Seal

## 2013-07-03 ENCOUNTER — Other Ambulatory Visit: Payer: Medicaid Other

## 2013-07-17 ENCOUNTER — Telehealth: Payer: Self-pay | Admitting: *Deleted

## 2013-07-17 ENCOUNTER — Ambulatory Visit (INDEPENDENT_AMBULATORY_CARE_PROVIDER_SITE_OTHER): Payer: Medicaid Other | Admitting: Physician Assistant

## 2013-07-17 ENCOUNTER — Encounter: Payer: Self-pay | Admitting: Physician Assistant

## 2013-07-17 VITALS — BP 110/70 | HR 60 | Ht 71.0 in | Wt 158.0 lb

## 2013-07-17 DIAGNOSIS — I2510 Atherosclerotic heart disease of native coronary artery without angina pectoris: Secondary | ICD-10-CM

## 2013-07-17 DIAGNOSIS — F1910 Other psychoactive substance abuse, uncomplicated: Secondary | ICD-10-CM

## 2013-07-17 DIAGNOSIS — M109 Gout, unspecified: Secondary | ICD-10-CM

## 2013-07-17 DIAGNOSIS — R748 Abnormal levels of other serum enzymes: Secondary | ICD-10-CM

## 2013-07-17 DIAGNOSIS — E782 Mixed hyperlipidemia: Secondary | ICD-10-CM

## 2013-07-17 DIAGNOSIS — I10 Essential (primary) hypertension: Secondary | ICD-10-CM

## 2013-07-17 DIAGNOSIS — M10471 Other secondary gout, right ankle and foot: Secondary | ICD-10-CM

## 2013-07-17 LAB — CK: Total CK: 386 U/L — ABNORMAL HIGH (ref 7–232)

## 2013-07-17 NOTE — Progress Notes (Signed)
Cardiology Office Note    Date:  07/17/2013   ID:  Willie HansenDaryle W Walker, DOB 11/28/1965, MRN [id number]  PCP:  Jeanann LewandowskyJEGEDE, OLUGBEMIGA, MD  Cardiologist:  Dr. Charlton HawsPeter Nishan      History of Present Illness: Willie Walker is a 48 y.o. male with a history of CAD and multiple PCI procedures in the past. He [place] with full metal jacket in the RCA and stent to the intermediate. Cardiac catheterization in 08/2011 demonstrated significant in-stent restenosis in the circumflex and RCA. This was treated with Cutting Balloon angioplasty. Last seen by Dr. Eden EmmsNishan 10/2012.  He is a participant in the ACCELERATE trial. Recent CK levels have been elevated and statin therapy has been discontinued. He returns for further evaluation  Patient misunderstood his instructions. He has discontinued the study drug but has continued on simvastatin. He also took medication for gout recently. This is not on his medication list but it sounds as though he was taking colchicine. He does admit to cocaine use in the past. He last used this about a year ago. He has noted left chest pain. He last had this about one month ago. He has noted this with rest and with exertion.  It was not like his prior angina. He denies significant dyspnea. He is NYHA class II. He denies any, PND or edema. He denies syncope.   Studies:  - LHC (08/2011):  dLM 30%, mLAD 60%, dLAD 40%, IM 30% ISR, D1 40-50%, OM/AV groove branch 95% ISR, pRCA 90% SIR, dRCA 80% ISR, EF 60%.  PCI:  Cutting Balloon angioplasty to the mid RCA; Cutting Balloon angioplasty to the mid circumflex.  - Stress echo (12/2008):  No ischemia (target heart rate not achieved)   Recent Labs: 12/20/2012: TSH 0.950  02/09/2013: ALT 16; Creatinine 1.16; HDL Cholesterol by NMR 132; Hemoglobin 14.3; LDL (calc) 94; Potassium 4.8   Wt Readings from Last 3 Encounters:  07/17/13 158 lb (71.668 kg)  02/09/13 158 lb 6.4 oz (71.85 kg)  12/20/12 155 lb 12.8 oz (70.67 kg)      Past Medical History  Diagnosis Date  . ELECTROCARDIOGRAM, ABNORMAL   . Coronary artery disease   . Anginal pain   . Hypertension   . GERD (gastroesophageal reflux disease)   . High cholesterol   . Peripheral vascular disease     LLE  . Myocardial infarction 10/2005  . Asthma     "had it; outgrew it"  . Pneumonia 1990's  . Shortness of breath 09/18/11    "@ rest; lying down; w/exertion"  . Type I diabetes mellitus   . Lower GI bleed 09/18/2011    "clots and everything; not lately"  . Headache(784.0) 09/18/2011    "maybe twice/wk; pressure on the brain"  . Seizures 09/18/2011    "blanks out on me"/wife's report  . Chronic lower back pain   . Gout   . Anxiety     Current Outpatient Prescriptions  Medication Sig Dispense Refill  . aspirin EC 81 MG tablet Take 1 tablet (81 mg total) by mouth daily.  60 tablet  2  . clopidogrel (PLAVIX) 75 MG tablet Take 1 tablet (75 mg total) by mouth daily with breakfast.  30 tablet  6  . cyclobenzaprine (FLEXERIL) 5 MG tablet Take 1 tablet (5 mg total) by mouth 3 (three) times daily as needed for muscle spasms.  30 tablet  0  . fish oil-omega-3 fatty acids 1000 MG capsule Take 1 g by  mouth 2 (two) times daily.      Marland Kitchen. HYDROcodone-acetaminophen (NORCO/VICODIN) 5-325 MG per tablet 1 to 2 tabs every 4 to 6 hours as needed for pain.  20 tablet  0  . lisinopril (PRINIVIL,ZESTRIL) 10 MG tablet Take 1 tablet (10 mg total) by mouth daily.  30 tablet  3  . metFORMIN (GLUCOPHAGE) 500 MG tablet Take 1 tablet (500 mg total) by mouth 2 (two) times daily with a meal.  60 tablet  5  . nitroGLYCERIN (NITROSTAT) 0.4 MG SL tablet Place 1 tablet (0.4 mg total) under the tongue every 5 (five) minutes x 3 doses as needed for chest pain.  25 tablet  1  . ranitidine (ZANTAC) 150 MG capsule Take 1 capsule (150 mg total) by mouth 2 (two) times daily.  60 capsule  4  . simvastatin (ZOCOR) 40 MG tablet Take 1 tablet (40 mg total) by mouth at bedtime.  30 tablet  6  .  STUDY MEDICATION       . TOPROL XL 50 MG 24 hr tablet TAKE ONE TABLET BY MOUTH ONCE DAILY WITH FOOD  30 tablet  0  . [DISCONTINUED] omeprazole (PRILOSEC) 20 MG capsule Take 20-40 mg by mouth daily.       No current facility-administered medications for this visit.    Allergies:   Crestor   Social History:  The patient  reports that he quit smoking about 7 years ago. His smoking use included Cigarettes. He has a 10 pack-year smoking history. He has never used smokeless tobacco. He reports that he uses illicit drugs (Cocaine, Marijuana, and "Crack" cocaine). He reports that he does not drink alcohol.   Family History:  The patient's family history includes CVA in his father; Dementia in his mother; Hypertension in his mother.   ROS:  Please see the history of present illness.   He does note depression over a recent divorce.  He denies significant myalgias.  All other systems reviewed and negative.   PHYSICAL EXAM: VS:  BP 110/70  Pulse 60  Ht 5\' 11"  (1.803 m)  Wt 158 lb (71.668 kg)  BMI 22.05 kg/m2 Well nourished, well developed, in no acute distress HEENT: normal Neck: no JVD Cardiac:  normal S1, S2; RRR; no murmur Lungs:  clear to auscultation bilaterally, no wheezing, rhonchi or rales Abd: soft, nontender, no hepatomegaly Ext: no edema Skin: warm and dry Neuro:  CNs 2-12 intact, no focal abnormalities noted  EKG:  NSR, HR 60, normal axis, no ST changes     ASSESSMENT AND PLAN:  1. Elevated CK:  In reviewing his chart, his total CK in 2011 was 755 and 466 on 2 separate occasions. He is still taking his simvastatin. I have asked him to stop this.  I will check a total CK today and repeat this in one month. If his total CK remains elevated off the simvastatin, refer to rheumatology. 2. CAD s/p Multiple PCIs:  He had a recent bout of atypical chest pain. When last seen by Dr. Eden EmmsNishan, ETT-echocardiogram was recommended. This was never performed. We will schedule this. 3. Essential  hypertension:  Controlled. 4. Mixed hyperlipidemia:  Hold simvastatin for now given recent elevated CK levels. 5. Other secondary gout of right foot:  Colchicine can cause elevated CK levels. He has been off of this for several days now. 6. Substance abuse:  Cocaine abuse can also cause elevated CK levels. He denies recent abuse. 7. Disposition: As noted, if his CK remains elevated off  the simvastatin, refer to rheumatology. Given his prior history, I suspect he has chronically elevated CK levels. Follow up with Dr. Eden Emms as planned.   Signed, Brynda Rim, MHS 07/17/2013 12:16 PM    [facility] [address] [place], [place], [place]  [address] Phone: [phone]; Fax: [phone]

## 2013-07-17 NOTE — Telephone Encounter (Signed)
lmptcb for lab results 

## 2013-07-17 NOTE — Patient Instructions (Signed)
STOP ZOCOR PER SCOTT WEAVER, PAC  LAB WORK TODAY; CK  YOU WILL NEED REPEAT LAB WORK IN 1 MONTH (CK)  YOU WILL NEED TO SCHEDULE FOR AN STRESS ECHO PER DR. Eden EmmsNISHAN; ORDER ALREADY IN SYSTEM  MAKE SURE TO FOLLOW UP WITH DR. Eden EmmsNISHAN IN 10/2013

## 2013-07-18 NOTE — Telephone Encounter (Signed)
lvm with family member for ptcb for lab results 

## 2013-07-19 ENCOUNTER — Encounter: Payer: Self-pay | Admitting: *Deleted

## 2013-07-19 NOTE — Telephone Encounter (Signed)
woman answered phone and when I asked to s/w pt she repeat name and then I heard her call him and then was lelft on the phone for over 3 minutes w/no-one coming to phone. I then cb and was busy, cb again and got machine, lmptcb x 3 for lab results.

## 2013-08-11 ENCOUNTER — Other Ambulatory Visit (HOSPITAL_COMMUNITY): Payer: Medicaid Other

## 2013-08-11 ENCOUNTER — Other Ambulatory Visit: Payer: Medicaid Other

## 2013-12-21 ENCOUNTER — Ambulatory Visit (INDEPENDENT_AMBULATORY_CARE_PROVIDER_SITE_OTHER): Payer: Medicaid Other | Admitting: Cardiovascular Disease

## 2013-12-21 ENCOUNTER — Encounter: Payer: Self-pay | Admitting: Cardiovascular Disease

## 2013-12-21 DIAGNOSIS — R748 Abnormal levels of other serum enzymes: Secondary | ICD-10-CM

## 2013-12-21 DIAGNOSIS — I10 Essential (primary) hypertension: Secondary | ICD-10-CM

## 2013-12-21 LAB — HEPATIC FUNCTION PANEL
ALBUMIN: 3.9 g/dL (ref 3.5–5.2)
ALT: 13 U/L (ref 0–53)
AST: 23 U/L (ref 0–37)
Alkaline Phosphatase: 69 U/L (ref 39–117)
Bilirubin, Direct: 0 mg/dL (ref 0.0–0.3)
Total Bilirubin: 0.4 mg/dL (ref 0.2–1.2)
Total Protein: 7.5 g/dL (ref 6.0–8.3)

## 2013-12-21 MED ORDER — CLOPIDOGREL BISULFATE 75 MG PO TABS: 75.0000 mg | ORAL_TABLET | Freq: Every day | ORAL | Status: DC

## 2013-12-21 MED ORDER — TOPROL XL 50 MG PO TB24: 50.0000 mg | ORAL_TABLET | Freq: Every day | ORAL | Status: DC

## 2013-12-21 MED ORDER — NITROGLYCERIN 0.4 MG SL SUBL: 0.4000 mg | SUBLINGUAL_TABLET | SUBLINGUAL | Status: DC | PRN

## 2013-12-21 MED ORDER — LISINOPRIL 10 MG PO TABS: 10.0000 mg | ORAL_TABLET | Freq: Every day | ORAL | Status: DC

## 2013-12-21 NOTE — Assessment & Plan Note (Signed)
Well controlled.  Continue current medications and low sodium Dash type diet.    

## 2013-12-21 NOTE — Progress Notes (Signed)
Patient ID: Willie Walker, male   DOB: 02/19/1965, 48 y.o.   MRN: [id number] Willie LownDaryle W Sharma Walker is a 48 y.o. male with a history of CAD and multiple PCI procedures in the past. He had several stents placed in [place] with full metal jacket in the RCA and stent to the intermediate. Cardiac catheterization in 08/2011 demonstrated significant in-stent restenosis in the circumflex and RCA. This was treated with Cutting Balloon angioplasty. Last seen by Dr. Eden EmmsNishan 10/2012. He is a participant in the ACCELERATE trial. Recent CK levels have been elevated and statin therapy has been discontinued. He returns for further evaluation  Patient misunderstood his instructions. He has discontinued the study drug but has continued on simvastatin. He also took medication for gout recently. This is not on his medication list but it sounds as though he was taking colchicine. He does admit to cocaine use in the past. He last used this about a year ago. He has noted left chest pain. He last had this about one month ago. He has noted this with rest and with exertion. It was not like his prior angina. He denies significant dyspnea. He is NYHA class II. He denies any, PND or edema. He denies syncope.   Studies: - LHC (08/2011): dLM 30%, mLAD 60%, dLAD 40%, IM 30% ISR, D1 40-50%, OM/AV groove branch 95% ISR, pRCA 90% SIR, dRCA 80% ISR, EF 60%. PCI: Cutting Balloon angioplasty to the mid RCA; Cutting Balloon angioplasty to the mid circumflex. - Stress echo (12/2008): No ischemia (target heart rate not achieved)   Recent Labs: 12/20/2012: TSH 0.950  02/09/2013: ALT 16; Creatinine 1.16; HDL Cholesterol by NMR 132; Hemoglobin 14.3; LDL (calc) 94; Potassium 4.8  CPK was as high as 600  Last checked in June and down to 300 range   No chest pain has run out of meds needs refills [facility] [place] Discussed lipid clinic visit for praluent as alternative      ROS: Denies fever, malais, weight loss, blurry vision, decreased  visual acuity, cough, sputum, SOB, hemoptysis, pleuritic pain, palpitaitons, heartburn, abdominal pain, melena, lower extremity edema, claudication, or rash.  All other systems reviewed and negative  General: Affect appropriate Healthy:  appears stated age HEENT: normal Neck supple with no adenopathy JVP normal no bruits no thyromegaly Lungs clear with no wheezing and good diaphragmatic motion Heart:  S1/S2 no murmur, no rub, gallop or click PMI normal Abdomen: benighn, BS positve, no tenderness, no AAA no bruit.  No HSM or HJR Distal pulses intact with no bruits No edema Neuro non-focal Skin warm and dry No muscular weakness   Current Outpatient Prescriptions  Medication Sig Dispense Refill  . aspirin EC 81 MG tablet Take 1 tablet (81 mg total) by mouth daily. 60 tablet 2  . clopidogrel (PLAVIX) 75 MG tablet Take 1 tablet (75 mg total) by mouth daily with breakfast. 30 tablet 6  . fish oil-omega-3 fatty acids 1000 MG capsule Take 1 g by mouth 2 (two) times daily.    Marland Kitchen. lisinopril (PRINIVIL,ZESTRIL) 10 MG tablet Take 1 tablet (10 mg total) by mouth daily. 30 tablet 3  . metFORMIN (GLUCOPHAGE) 500 MG tablet Take 1 tablet (500 mg total) by mouth 2 (two) times daily with a meal. 60 tablet 5  . TOPROL XL 50 MG 24 hr tablet TAKE ONE TABLET BY MOUTH ONCE DAILY WITH FOOD 30 tablet 0  . nitroGLYCERIN (NITROSTAT) 0.4 MG SL tablet Place 1 tablet (0.4 mg total) under the tongue every 5 (  five) minutes x 3 doses as needed for chest pain. 25 tablet 1  . [DISCONTINUED] omeprazole (PRILOSEC) 20 MG capsule Take 20-40 mg by mouth daily.     No current facility-administered medications for this visit.    Allergies  Crestor  Electrocardiogram: 07/17/13 SR normal ECG   Assessment and Plan

## 2013-12-21 NOTE — Assessment & Plan Note (Signed)
Will check CPK and LFTs today refer to lipid clinic  Not clear if study drug or statin caused elevation in CPK but suspect statin since it persisted after study drug stopped and on zocor

## 2013-12-21 NOTE — Assessment & Plan Note (Signed)
Stable with no angina and good activity level.  Continue medical Rx Continue DAT given multiple stents

## 2013-12-21 NOTE — Patient Instructions (Signed)
Your physician wants you to follow-up in:    6 MONTHS  WITH  DR Eden EmmsNISHAN  AND  LIPID CLINIC  ASAP  You will receive a reminder letter in the mail two months in advance. If you don't receive a letter, please call our office to schedule the follow-up appointment. Your physician recommends that you continue on your current medications as directed. Please refer to the Current Medication list given to you today. Your physician recommends that you return for lab work in: TODAY   CPK WITH  MB  AND  LIVER

## 2013-12-28 ENCOUNTER — Encounter (HOSPITAL_COMMUNITY): Payer: Self-pay | Admitting: Cardiology

## 2013-12-29 ENCOUNTER — Ambulatory Visit: Payer: Self-pay | Admitting: Pharmacist

## 2014-01-01 ENCOUNTER — Ambulatory Visit: Payer: Self-pay | Admitting: Pharmacist

## 2014-01-01 ENCOUNTER — Encounter: Payer: Self-pay | Admitting: Cardiology

## 2014-01-02 NOTE — Progress Notes (Signed)
This encounter was created in error - please disregard.

## 2014-01-30 ENCOUNTER — Telehealth: Payer: Self-pay | Admitting: *Deleted

## 2014-01-30 NOTE — Telephone Encounter (Signed)
Notes Recorded by Alois Clichehristine E Tiffiney Sparrow, LPN on 8/1/19141/05/2014 at 1:15 PM LMTCB ./CY Notes Recorded by Lorelle FormosaPatricia M Via, LPN on 78/29/562112/28/2015 at 11:44 AM The pt is advised and he verbalized understanding. Notes Recorded by Alois Clichehristine E Brandye Inthavong, LPN on 30/86/578412/21/2015 at 8:35 AM LMTCB ./CY Notes Recorded by Alois Clichehristine E Stevana Dufner, LPN on 69/62/952812/16/2015 at 12:56 PM LMTCB ./CY Notes Recorded by Alois Clichehristine E Brandonn Capelli, LPN on 41/3/244012/09/2013 at 12:49 PM LMTCB ./CY Notes Recorded by Wendall StadePeter C Nishan, MD on 12/21/2013 at 5:58 PM Normal lft's     Ref Range 20mo ago       CPK   NOT  DONE    UNABLE TO LET  PT  KNOW  LEFT MESSAGE  ONCE  AGAIN TO  CALL BACK .Zack Seal/CY

## 2014-02-05 NOTE — Telephone Encounter (Signed)
The pt is advised and he verbalized understanding. 

## 2014-02-05 NOTE — Telephone Encounter (Signed)
F/U         Pt returning call from nurse, thinks it may be in regards to labs.    Please call back.

## 2014-06-17 NOTE — Progress Notes (Signed)
Patient ID: Willie Walker, male   DOB: 08/06/1965, 49 y.o.   MRN: [id number] Willie Walker Covertorman is a 49 y.o.  male with a history of CAD and multiple PCI procedures in the past. He had several stents placed in [place] with full metal jacket in the RCA and stent to the intermediate. Cardiac catheterization in 08/2011 demonstrated significant in-stent restenosis in the circumflex and RCA. This was treated with Cutting Balloon angioplasty. He is a participant in the ACCELERATE trial. Recent CK levels have been elevated and statin therapy has been discontinued. He returns for further evaluation  .12/21/13  LFT;s normal  CPK's normal off simvastatin    Studies: - LHC (08/2011): dLM 30%, mLAD 60%, dLAD 40%, IM 30% ISR, D1 40-50%, OM/AV groove branch 95% ISR, pRCA 90% SIR, dRCA 80% ISR, EF 60%. PCI: Cutting Balloon angioplasty to the mid RCA; Cutting Balloon angioplasty to the mid circumflex. - Stress echo (12/2008): No ischemia (target heart rate not achieved)   Recent Labs: 12/20/2012: TSH 0.950  02/09/2013: ALT 16; Creatinine 1.16; HDL Cholesterol by NMR 132; Hemoglobin 14.3; LDL (calc) 94; Potassium 4.8  CPK was as high as 600  Last checked in June/15  and down to 300 range   No chest pain has run out of meds needs refills [facility] [place] Discussed lipid clinic visit for praluent as alternative      ROS: Denies fever, malais, weight loss, blurry vision, decreased visual acuity, cough, sputum, SOB, hemoptysis, pleuritic pain, palpitaitons, heartburn, abdominal pain, melena, lower extremity edema, claudication, or rash.  All other systems reviewed and negative  General: Affect appropriate Healthy:  appears stated age HEENT: normal Neck supple with no adenopathy JVP normal no bruits no thyromegaly Lungs clear with no wheezing and good diaphragmatic motion Heart:  S1/S2 no murmur, no rub, gallop or click PMI normal Abdomen: benighn, BS positve, no tenderness, no AAA no bruit.  No HSM or  HJR Distal pulses intact with no bruits No edema Neuro non-focal Skin warm and dry No muscular weakness   Current Outpatient Prescriptions  Medication Sig Dispense Refill  . aspirin EC 81 MG tablet Take 1 tablet (81 mg total) by mouth daily. 60 tablet 2  . clopidogrel (PLAVIX) 75 MG tablet Take 1 tablet (75 mg total) by mouth daily with breakfast. 30 tablet 11  . fish oil-omega-3 fatty acids 1000 MG capsule Take 1 g by mouth 2 (two) times daily.    Marland Kitchen. lisinopril (PRINIVIL,ZESTRIL) 10 MG tablet Take 1 tablet (10 mg total) by mouth daily. 30 tablet 11  . metFORMIN (GLUCOPHAGE) 500 MG tablet Take 1 tablet (500 mg total) by mouth 2 (two) times daily with a meal. 60 tablet 5  . nitroGLYCERIN (NITROSTAT) 0.4 MG SL tablet Place 1 tablet (0.4 mg total) under the tongue every 5 (five) minutes x 3 doses as needed for chest pain. 25 tablet 3  . TOPROL XL 50 MG 24 hr tablet Take 1 tablet (50 mg total) by mouth daily. Take with or immediately following a meal. 30 tablet 11  . [DISCONTINUED] omeprazole (PRILOSEC) 20 MG capsule Take 20-40 mg by mouth daily.     No current facility-administered medications for this visit.    Allergies  Crestor  Electrocardiogram: 07/17/13 SR normal ECG   Assessment and Plan

## 2014-06-19 ENCOUNTER — Encounter: Payer: Self-pay | Admitting: Cardiovascular Disease

## 2014-06-20 NOTE — Progress Notes (Signed)
Cardiology Office Note   Date:  06/20/2014   ID:  Willie HansenDaryle W Arther, DOB 05/25/1965, MRN [id number]  PCP:  Jeanann LewandowskyJEGEDE, OLUGBEMIGA, MD  Cardiologist:  Dr. Charlton HawsPeter Nishan     Chief Complaint  Patient presents with  . Coronary Artery Disease  . Follow-up    Hyperlipidemia; Elevated CK     History of Present Illness: Willie Walker is a 49 y.o. male with a hx of CAD and multiple PCI procedures in the past. He had several stents placed in [place] with full metal jacket in the RCA and stent to the intermediate. Cardiac catheterization in 08/2011 demonstrated significant in-stent restenosis in the circumflex and RCA. This was treated with Cutting Balloon angioplasty.  He is a participant in the ACCELERATE trial. He has had elevated CK levels and statin therapy has been discontinued.   Last seen by Dr. Charlton HawsPeter Nishan 12/2013.  He was supposed to be referred to the Lipid Clinic.  He canceled one appointment and did not show for another appointment.  He returns for FU.     Studies/Reports Reviewed Today:  - LHC (08/2011): dLM 30%, mLAD 60%, dLAD 40%, IM 30% ISR, D1 40-50%, OM/AV groove branch 95% ISR, pRCA 90% SIR, dRCA 80% ISR, EF 60%. PCI: Cutting Balloon angioplasty to the mid RCA; Cutting Balloon angioplasty to the mid circumflex. - Stress echo (12/2008): No ischemia (target heart rate not achieved)   Past Medical History  Diagnosis Date  . ELECTROCARDIOGRAM, ABNORMAL   . Coronary artery disease   . Anginal pain   . Hypertension   . GERD (gastroesophageal reflux disease)   . High cholesterol   . Peripheral vascular disease     LLE  . Myocardial infarction 10/2005  . Asthma     "had it; outgrew it"  . Pneumonia 1990's  . Shortness of breath 09/18/11    "@ rest; lying down; w/exertion"  . Type I diabetes mellitus   . Lower GI bleed 09/18/2011    "clots and everything; not lately"  . Headache(784.0) 09/18/2011    "maybe twice/wk; pressure on the brain"  . Seizures 09/18/2011   "blanks out on me"/wife's report  . Chronic lower back pain   . Gout   . Anxiety     Past Surgical History  Procedure Laterality Date  . Coronary angioplasty with stent placement  10/2005    "9"  . Coronary angioplasty  09/18/2011  . Ines BloomerBurr hole of cranium  1999    "mugged"  . Laceration repair  1999    BLE "muggine"  . Percutaneous coronary stent intervention (pci-s) N/A 09/18/2011    Procedure: PERCUTANEOUS CORONARY STENT INTERVENTION (PCI-S);  Surgeon: Peter M SwazilandJordan, MD;  [facility];  Service: Cardiovascular;  Laterality: N/A;     Current Outpatient Prescriptions  Medication Sig Dispense Refill  . aspirin EC 81 MG tablet Take 1 tablet (81 mg total) by mouth daily. 60 tablet 2  . clopidogrel (PLAVIX) 75 MG tablet Take 1 tablet (75 mg total) by mouth daily with breakfast. 30 tablet 11  . fish oil-omega-3 fatty acids 1000 MG capsule Take 1 g by mouth 2 (two) times daily.    Marland Kitchen. lisinopril (PRINIVIL,ZESTRIL) 10 MG tablet Take 1 tablet (10 mg total) by mouth daily. 30 tablet 11  . metFORMIN (GLUCOPHAGE) 500 MG tablet Take 1 tablet (500 mg total) by mouth 2 (two) times daily with a meal. 60 tablet 5  . nitroGLYCERIN (NITROSTAT) 0.4 MG SL tablet Place 1 tablet (  0.4 mg total) under the tongue every 5 (five) minutes x 3 doses as needed for chest pain. 25 tablet 3  . TOPROL XL 50 MG 24 hr tablet Take 1 tablet (50 mg total) by mouth daily. Take with or immediately following a meal. 30 tablet 11  . [DISCONTINUED] omeprazole (PRILOSEC) 20 MG capsule Take 20-40 mg by mouth daily.     No current facility-administered medications for this visit.    Allergies:   Crestor    Social History:  The patient  reports that he quit smoking about 8 years ago. His smoking use included Cigarettes. He has a 10 pack-year smoking history. He has never used smokeless tobacco. He reports that he uses illicit drugs (Cocaine, Marijuana, and "Crack" cocaine). He reports that he does not drink alcohol.    Family History:  The patient's family history includes CVA in his father; Dementia in his mother; Hypertension in his mother.    ROS:   Please see the history of present illness.   ROS    PHYSICAL EXAM: VS:  There were no vitals taken for this visit.    Wt Readings from Last 3 Encounters:  12/21/13 158 lb (71.668 kg)  07/17/13 158 lb (71.668 kg)  02/09/13 158 lb 6.4 oz (71.85 kg)     GEN: Well nourished, well developed, in no acute distress HEENT: normal Neck: no JVD, no carotid bruits, no masses Cardiac:  Normal S1/S2, RRR; no murmur ,  no rubs or gallops, no edema  Respiratory:  clear to auscultation bilaterally, no wheezing, rhonchi or rales. GI: soft, nontender, nondistended, + BS MS: no deformity or atrophy Skin: warm and dry  Neuro:  CNs II-XII intact, Strength and sensation are intact Psych: Normal affect   EKG:  EKG is ordered today.  It demonstrates:      Recent Labs: 12/21/2013: ALT 13    Lipid Panel    Component Value Date/Time   CHOL 264* 02/09/2013 1507   TRIG 189* 02/09/2013 1507   HDL 132 02/09/2013 1507   CHOLHDL 2.0 02/09/2013 1507   VLDL 38 02/09/2013 1507   LDLCALC 94 02/09/2013 1507      ASSESSMENT AND PLAN:  Elevated CK  CAD s/p Multiple PCIs  Essential hypertension  Mixed hyperlipidemia    Current medicines are reviewed at length with the patient today.  Concerns regarding medicines are as outlined above.  The following changes have been made:       Labs/ tests ordered today include:  No orders of the defined types were placed in this encounter.    Disposition:   FU with    Signed, Brynda Rim, MHS 06/20/2014 10:24 PM    [facility] [address] [place], [place], [place]  [address] Phone: [phone]; Fax: [phone]    This encounter was created in error - please disregard.

## 2014-06-21 ENCOUNTER — Encounter: Payer: Self-pay | Admitting: Physician Assistant

## 2014-08-29 ENCOUNTER — Encounter: Payer: Self-pay | Admitting: *Deleted

## 2014-08-29 DIAGNOSIS — Z006 Encounter for examination for normal comparison and control in clinical research program: Secondary | ICD-10-CM

## 2014-08-29 NOTE — Progress Notes (Addendum)
STATUS FIRST Informed Consent   Subject Name: Willie Walker    Subject met inclusion and exclusion criteria.The informed consent form, study requirements and expectations were reviewed with the subject and questions and concerns were addressed prior to the signing of the consent form. The subject verbalized understanding of the trail requirements. The subject agreed to participate in the STATUS FIRST research study and signed the informed consent at 10:25  on 08-29-14. The informed consent was obtained prior to performance of any protocol-specific procedures for the subject. A copy of the signed informed consent was given to the subject and a copy was placed in the subjects's medical record.    [place] Chalmers 08/29/14 10:25

## 2014-11-01 ENCOUNTER — Ambulatory Visit: Payer: Self-pay | Attending: Internal Medicine | Admitting: Internal Medicine

## 2014-11-01 VITALS — BP 105/70 | HR 68 | Temp 98.5°F | Resp 18 | Ht 71.0 in | Wt 160.0 lb

## 2014-11-01 DIAGNOSIS — E7800 Pure hypercholesterolemia, unspecified: Secondary | ICD-10-CM | POA: Insufficient documentation

## 2014-11-01 DIAGNOSIS — Z823 Family history of stroke: Secondary | ICD-10-CM | POA: Insufficient documentation

## 2014-11-01 DIAGNOSIS — I10 Essential (primary) hypertension: Secondary | ICD-10-CM | POA: Insufficient documentation

## 2014-11-01 DIAGNOSIS — Z7982 Long term (current) use of aspirin: Secondary | ICD-10-CM | POA: Insufficient documentation

## 2014-11-01 DIAGNOSIS — Z8249 Family history of ischemic heart disease and other diseases of the circulatory system: Secondary | ICD-10-CM | POA: Insufficient documentation

## 2014-11-01 DIAGNOSIS — Z87891 Personal history of nicotine dependence: Secondary | ICD-10-CM | POA: Insufficient documentation

## 2014-11-01 DIAGNOSIS — K219 Gastro-esophageal reflux disease without esophagitis: Secondary | ICD-10-CM | POA: Insufficient documentation

## 2014-11-01 DIAGNOSIS — E119 Type 2 diabetes mellitus without complications: Secondary | ICD-10-CM | POA: Insufficient documentation

## 2014-11-01 DIAGNOSIS — M1000 Idiopathic gout, unspecified site: Secondary | ICD-10-CM | POA: Insufficient documentation

## 2014-11-01 DIAGNOSIS — I2510 Atherosclerotic heart disease of native coronary artery without angina pectoris: Secondary | ICD-10-CM | POA: Insufficient documentation

## 2014-11-01 DIAGNOSIS — Z955 Presence of coronary angioplasty implant and graft: Secondary | ICD-10-CM | POA: Insufficient documentation

## 2014-11-01 DIAGNOSIS — I252 Old myocardial infarction: Secondary | ICD-10-CM | POA: Insufficient documentation

## 2014-11-01 DIAGNOSIS — Z79899 Other long term (current) drug therapy: Secondary | ICD-10-CM | POA: Insufficient documentation

## 2014-11-01 LAB — POCT URINALYSIS DIPSTICK
Bilirubin, UA: NEGATIVE
GLUCOSE UA: NEGATIVE
KETONES UA: NEGATIVE
Leukocytes, UA: NEGATIVE
Nitrite, UA: NEGATIVE
PH UA: 6.5
PROTEIN UA: NEGATIVE
Spec Grav, UA: 1.01
Urobilinogen, UA: 0.2

## 2014-11-01 LAB — POCT GLYCOSYLATED HEMOGLOBIN (HGB A1C): Hemoglobin A1C: 5.9

## 2014-11-01 LAB — GLUCOSE, POCT (MANUAL RESULT ENTRY): POC Glucose: 138 mg/dl — AB (ref 70–99)

## 2014-11-01 MED ORDER — CARVEDILOL 3.125 MG PO TABS: 3.1250 mg | ORAL_TABLET | Freq: Two times a day (BID) | ORAL | Status: DC

## 2014-11-01 MED ORDER — METFORMIN HCL 500 MG PO TABS: 500.0000 mg | ORAL_TABLET | Freq: Two times a day (BID) | ORAL | Status: DC

## 2014-11-01 MED ORDER — ALLOPURINOL 100 MG PO TABS: 100.0000 mg | ORAL_TABLET | Freq: Every day | ORAL | Status: DC

## 2014-11-01 MED ORDER — INDOMETHACIN 50 MG PO CAPS: 50.0000 mg | ORAL_CAPSULE | Freq: Two times a day (BID) | ORAL | Status: DC

## 2014-11-01 MED ORDER — LISINOPRIL 10 MG PO TABS: 10.0000 mg | ORAL_TABLET | Freq: Every day | ORAL | Status: DC

## 2014-11-01 MED ORDER — COLCHICINE 0.6 MG PO TABS: 0.6000 mg | ORAL_TABLET | Freq: Every day | ORAL | Status: DC

## 2014-11-01 NOTE — Progress Notes (Signed)
Patient ID: Willie Walker, male   DOB: 1965-11-03, 49 y.o.   MRN: [id number]   Willie Walker, is a 49 y.o. male  [id number]  [id number]  DOB - Jun 09, 1965  CC:  Chief Complaint  Patient presents with  . Follow-up       HPI: Willie Walker is a 49 y.o. male here today to establish medical care and for medication refills. Patient has significant medical history of CAD Multiple stents placed in [place] with full metal jacket to RCA and stents in IM and circuflex Cath in 2007 , EF of 60%, suppose to be on aspirin and Plavix, followed by Dr. Charlton Haws, patient also has medical history of DMT2, Gout and HTN. Patient also has complaints of pain in right foot and right elbow. Patient describes pain as intense and sharp with any pressure. Pain started two weeks ago and is slowly resolving. Patient attributes pain to Gout flare and states he hasn't taken any medication for pain. Patient rates pain as 8 of 10 with pressure. Patient is not compliant with any of his medication except Aspirin 81 mg. He states he has not had medication since April, 2016. Patient's AIC is 5.9 today and BP is 105/70.   Patient has No headache, No chest pain, No abdominal pain - No Nausea, No new weakness tingling or numbness, No Cough - SOB.  Allergies  Allergen Reactions  . Crestor [Rosuvastatin] Other (See Comments)    "Makes my legs hurt" - tolerates simvastatin    Past Medical History  Diagnosis Date  . ELECTROCARDIOGRAM, ABNORMAL   . Coronary artery disease   . Anginal pain   . Hypertension   . GERD (gastroesophageal reflux disease)   . High cholesterol   . Peripheral vascular disease     LLE  . Myocardial infarction 10/2005  . Asthma     "had it; outgrew it"  . Pneumonia 1990's  . Shortness of breath 09/18/11    "@ rest; lying down; w/exertion"  . Type I diabetes mellitus   . Lower GI bleed 09/18/2011    "clots and everything; not lately"  . Headache(784.0) 09/18/2011    "maybe twice/wk;  pressure on the brain"  . Seizures 09/18/2011    "blanks out on me"/wife's report  . Chronic lower back pain   . Gout   . Anxiety    Current Outpatient Prescriptions on File Prior to Visit  Medication Sig Dispense Refill  . aspirin EC 81 MG tablet Take 1 tablet (81 mg total) by mouth daily. 60 tablet 2  . nitroGLYCERIN (NITROSTAT) 0.4 MG SL tablet Place 1 tablet (0.4 mg total) under the tongue every 5 (five) minutes x 3 doses as needed for chest pain. 25 tablet 3  . clopidogrel (PLAVIX) 75 MG tablet Take 1 tablet (75 mg total) by mouth daily with breakfast. (Patient not taking: Reported on 11/01/2014) 30 tablet 11  . fish oil-omega-3 fatty acids 1000 MG capsule Take 1 g by mouth 2 (two) times daily.    . TOPROL XL 50 MG 24 hr tablet Take 1 tablet (50 mg total) by mouth daily. Take with or immediately following a meal. (Patient not taking: Reported on 11/01/2014) 30 tablet 11  . [DISCONTINUED] omeprazole (PRILOSEC) 20 MG capsule Take 20-40 mg by mouth daily.     No current facility-administered medications on file prior to visit.   Family History  Problem Relation Age of Onset  . Hypertension Mother   . Dementia Mother   . CVA  Father    Social History   Social History  . Marital Status: Married    Spouse Name: N/A  . Number of Children: N/A  . Years of Education: N/A   Occupational History  . Not on file.   Social History Main Topics  . Smoking status: Former Smoker -- 0.50 packs/day for 20 years    Types: Cigarettes    Quit date: 11/02/2005  . Smokeless tobacco: Never Used  . Alcohol Use: No     Comment: 09/18/2011 "twice a month; 6 shots at a time"  . Drug Use: Yes    Special: Cocaine, Marijuana, "Crack" cocaine     Comment: 09/18/2011 quit "06/11/2003"  . Sexual Activity: Yes   Other Topics Concern  . Not on file   Social History Narrative    Review of Systems  Constitutional: Negative.   HENT: Negative.   Eyes: Negative.   Respiratory: Negative.     Cardiovascular: Negative.  Negative for chest pain, palpitations, orthopnea, claudication, leg swelling and PND.  Gastrointestinal: Negative.   Genitourinary: Negative.   Musculoskeletal: Positive for joint pain.       Right great toe and right elbow  Skin: Negative.   Neurological: Negative.   Endo/Heme/Allergies: Negative.   Psychiatric/Behavioral: Negative.      Objective:   Filed Vitals:   11/01/14 1640  BP: 105/70  Pulse: 68  Temp: 98.5 F (36.9 C)  Resp: 18    Physical Exam  Constitutional: He appears well-developed and well-nourished.  HENT:  Head: Normocephalic and atraumatic.  Right Ear: External ear normal.  Left Ear: External ear normal.  Nose: Nose normal.  Mouth/Throat: Oropharynx is clear and moist.  Eyes: Conjunctivae and EOM are normal. Pupils are equal, round, and reactive to light.  Neck: Normal range of motion. Neck supple.  Cardiovascular: Normal rate, regular rhythm, normal heart sounds and intact distal pulses.   Pulmonary/Chest: Effort normal and breath sounds normal.  Abdominal: Soft. Bowel sounds are normal.  Musculoskeletal: He exhibits tenderness.  Patient positive for right tophi and right podagra   Neurological: He is alert. He has normal reflexes.  Skin: Skin is warm and dry. There is erythema.  Right great toe and right elbow  Psychiatric: He has a normal mood and affect. His behavior is normal. Judgment and thought content normal.  Nursing note and vitals reviewed.     Lab Results  Component Value Date   WBC 4.4 02/09/2013   HGB 14.3 02/09/2013   HCT 40.8 02/09/2013   MCV 84.1 02/09/2013   PLT 281 02/09/2013   Lab Results  Component Value Date   CREATININE 1.16 02/09/2013   BUN 14 02/09/2013   NA 139 02/09/2013   K 4.8 02/09/2013   CL 104 02/09/2013   CO2 29 02/09/2013    Lab Results  Component Value Date   HGBA1C 5.90 11/01/2014   Lipid Panel     Component Value Date/Time   CHOL 264* 02/09/2013 1507   TRIG 189*  02/09/2013 1507   HDL 132 02/09/2013 1507   CHOLHDL 2.0 02/09/2013 1507   VLDL 38 02/09/2013 1507   LDLCALC 94 02/09/2013 1507       Assessment and plan:   Cari was seen today for follow-up.  Diagnoses and all orders for this visit:  Type 2 diabetes mellitus without complication, without long-term current use of insulin (HCC) -     Glucose (CBG) -     POCT A1C -     Microalbumin/Creatinine Ratio,  Urine -     metFORMIN (GLUCOPHAGE) 500 MG tablet; Take 1 tablet (500 mg total) by mouth 2 (two) times daily with a meal.       Patient was congratulated on [facility] that was below diabetic diagnosis threshold (5.9%). Patient was encouraged to keep up his diet and exercise regime and to continue monitoring blood sugar levels in the morning before meals. Patient verbalized understanding.  Aim for 30 minutes of exercise most days. Rethink what you drink. Water is great! Aim for 2-3 Carb Choices per meal (30-45 grams) +/- 1 either way  Aim for 0-15 Carbs per snack if hungry  Include protein in moderation with your meals and snacks  Consider reading food labels for Total Carbohydrate and Fat Grams of foods  Consider checking BG at alternate times per day  Continue taking medication as directed Be mindful about how much sugar you are adding to beverages and other foods. Fruit Punch - find one with no sugar  Measure and decrease portions of carbohydrate foods  Make your plate and don't go back for seconds    Essential hypertension, benign  -     Basic Metabolic Panel -     CBC with Differential -     Lipid Panel -     lisinopril (PRINIVIL,ZESTRIL) 10 MG tablet; Take 1 tablet (10 mg total) by mouth daily. -     carvedilol (COREG) 3.125 MG tablet; Take 1 tablet (3.125 mg total) by mouth 2 (two) times daily with a meal.  We discussed target BP and blood pressure goals and encouraged the patient to continue with current diet and exercise. Patient was encouraged to be compliant with medications  and to check BP regularly and to call us back or report to the clinic if the numbers are consistently higher than 140/90.  We discussed the signs and symptoms of hypotension, since patient has been without medication for last 6 months and is restarting his medications today. Patient will follow up with the office for a nurse BP visit in 4 weeks.  Acute idiopathic gout, unspecified site  -     Uric Acid -     POCT urinalysis dipstick -     colchicine 0.6 MG tablet; Take 1 tablet (0.6 mg total) by mouth daily. -     allopurinol (ZYLOPRIM) 100 MG tablet; Take 1 tablet (100 mg total) by mouth daily. -     indomethacin (INDOCIN) 50 MG capsule; Take 1 capsule (50 mg total) by mouth 2 (two) times daily with a meal. Patient was instructed to not begin taking allopurinol until all signs of Gout flare have resolved. When flare has resolved patient was instructed to stop colchicine and indomethacin. Patient verbalized understanding of all instructions.   Return in 4 weeks for nurse only BP visit.  Return in about 3 months (around 02/01/2015), or if symptoms worsen or fail to improve, for Hemoglobin A1C and Follow up, DM, Follow up HTN, Follow up Pain and comorbidities.  The patient was given clear instructions to go to ER or return to medical center if symptoms don't improve, worsen or new problems develop. The patient verbalized understanding. The patient was told to call to get lab results if they haven't heard anything in the next week.     Stephanie Coup, RN, BSN, AGNP, APP Student [facility] [phone] 11/01/2014, 6:04 PM   Evaluation and management procedures were performed by the Advanced Practitioner under my supervision and collaboration. I  have reviewed the Advanced Practitioner's note and chart, and I agree with the management and plan.   Jeanann Lewandowsky, MD, MHA, CPE, FACP, FAAP [facility] [place],  [place] [phone]   11/01/2014, 6:38 PM

## 2014-11-01 NOTE — Patient Instructions (Signed)
Diabetes and Exercise Exercising regularly is important. It is not just about losing weight. It has many health benefits, such as:  Improving your overall fitness, flexibility, and endurance.  Increasing your bone density.  Helping with weight control.  Decreasing your body fat.  Increasing your muscle strength.  Reducing stress and tension.  Improving your overall health. People with diabetes who exercise gain additional benefits because exercise:  Reduces appetite.  Improves the body's use of blood sugar (glucose).  Helps lower or control blood glucose.  Decreases blood pressure.  Helps control blood lipids (such as cholesterol and triglycerides).  Improves the body's use of the hormone insulin by:  Increasing the body's insulin sensitivity.  Reducing the body's insulin needs.  Decreases the risk for heart disease because exercising:  Lowers cholesterol and triglycerides levels.  Increases the levels of good cholesterol (such as high-density lipoproteins [HDL]) in the body.  Lowers blood glucose levels. YOUR ACTIVITY PLAN  Choose an activity that you enjoy, and set realistic goals. To exercise safely, you should begin practicing any new physical activity slowly, and gradually increase the intensity of the exercise over time. Your health care provider or diabetes educator can help create an activity plan that works for you. General recommendations include:  Encouraging children to engage in at least 60 minutes of physical activity each day.  Stretching and performing strength training exercises, such as yoga or weight lifting, at least 2 times per week.  Performing a total of at least 150 minutes of moderate-intensity exercise each week, such as brisk walking or water aerobics.  Exercising at least 3 days per week, making sure you allow no more than 2 consecutive days to pass without exercising.  Avoiding long periods of inactivity (90 minutes or more). When you  have to spend an extended period of time sitting down, take frequent breaks to walk or stretch. RECOMMENDATIONS FOR EXERCISING WITH TYPE 1 OR TYPE 2 DIABETES   Check your blood glucose before exercising. If blood glucose levels are greater than 240 mg/dL, check for urine ketones. Do not exercise if ketones are present.  Avoid injecting insulin into areas of the body that are going to be exercised. For example, avoid injecting insulin into:  The arms when playing tennis.  The legs when jogging.  Keep a record of:  Food intake before and after you exercise.  Expected peak times of insulin action.  Blood glucose levels before and after you exercise.  The type and amount of exercise you have done.  Review your records with your health care provider. Your health care provider will help you to develop guidelines for adjusting food intake and insulin amounts before and after exercising.  If you take insulin or oral hypoglycemic agents, watch for signs and symptoms of hypoglycemia. They include:  Dizziness.  Shaking.  Sweating.  Chills.  Confusion.  Drink plenty of water while you exercise to prevent dehydration or heat stroke. Body water is lost during exercise and must be replaced.  Talk to your health care provider before starting an exercise program to make sure it is safe for you. Remember, almost any type of activity is better than none.   This information is not intended to replace advice given to you by your health care provider. Make sure you discuss any questions you have with your health care provider.   Document Released: 03/28/2003 Document Revised: 05/22/2014 Document Reviewed: 06/14/2012 Elsevier Interactive Patient Education 2016 Elsevier Inc. Basic Carbohydrate Counting for Diabetes Mellitus Carbohydrate counting   is a method for keeping track of the amount of carbohydrates you eat. Eating carbohydrates naturally increases the level of sugar (glucose) in your  blood, so it is important for you to know the amount that is okay for you to have in every meal. Carbohydrate counting helps keep the level of glucose in your blood within normal limits. The amount of carbohydrates allowed is different for every person. A dietitian can help you calculate the amount that is right for you. Once you know the amount of carbohydrates you can have, you can count the carbohydrates in the foods you want to eat. Carbohydrates are found in the following foods:  Grains, such as breads and cereals.  Dried beans and soy products.  Starchy vegetables, such as potatoes, peas, and corn.  Fruit and fruit juices.  Milk and yogurt.  Sweets and snack foods, such as cake, cookies, candy, chips, soft drinks, and fruit drinks. CARBOHYDRATE COUNTING There are two ways to count the carbohydrates in your food. You can use either of the methods or a combination of both. Reading the "Nutrition Facts" on Packaged Food The "Nutrition Facts" is an area that is included on the labels of almost all packaged food and beverages in the [place]. It includes the serving size of that food or beverage and information about the nutrients in each serving of the food, including the grams (g) of carbohydrate per serving.  Decide the number of servings of this food or beverage that you will be able to eat or drink. Multiply that number of servings by the number of grams of carbohydrate that is listed on the label for that serving. The total will be the amount of carbohydrates you will be having when you eat or drink this food or beverage. Learning Standard Serving Sizes of Food When you eat food that is not packaged or does not include "Nutrition Facts" on the label, you need to measure the servings in order to count the amount of carbohydrates.A serving of most carbohydrate-rich foods contains about 15 g of carbohydrates. The following list includes serving sizes of carbohydrate-rich foods that  provide 15 g ofcarbohydrate per serving:   1 slice of bread (1 oz) or 1 six-inch tortilla.    of a hamburger bun or English muffin.  4-6 crackers.   cup unsweetened dry cereal.    cup hot cereal.   cup rice or pasta.    cup mashed potatoes or  of a large baked potato.  1 cup fresh fruit or one small piece of fruit.    cup canned or frozen fruit or fruit juice.  1 cup milk.   cup plain fat-free yogurt or yogurt sweetened with artificial sweeteners.   cup cooked dried beans or starchy vegetable, such as peas, corn, or potatoes.  Decide the number of standard-size servings that you will eat. Multiply that number of servings by 15 (the grams of carbohydrates in that serving). For example, if you eat 2 cups of strawberries, you will have eaten 2 servings and 30 g of carbohydrates (2 servings x 15 g = 30 g). For foods such as soups and casseroles, in which more than one food is mixed in, you will need to count the carbohydrates in each food that is included. EXAMPLE OF CARBOHYDRATE COUNTING Sample Dinner  3 oz chicken breast.   cup of brown rice.   cup of corn.  1 cup milk.   1 cup strawberries with sugar-free whipped topping.  Carbohydrate Calculation Step   1: Identify the foods that contain carbohydrates:   Rice.   Corn.   Milk.   Strawberries. Step 2:Calculate the number of servings eaten of each:   2 servings of rice.   1 serving of corn.   1 serving of milk.   1 serving of strawberries. Step 3: Multiply each of those number of servings by 15 g:   2 servings of rice x 15 g = 30 g.   1 serving of corn x 15 g = 15 g.   1 serving of milk x 15 g = 15 g.   1 serving of strawberries x 15 g = 15 g. Step 4: Add together all of the amounts to find the total grams of carbohydrates eaten: 30 g + 15 g + 15 g + 15 g = 75 g.   This information is not intended to replace advice given to you by your health care provider. Make sure you  discuss any questions you have with your health care provider.   Document Released: 01/05/2005 Document Revised: 01/26/2014 Document Reviewed: 12/02/2012 Elsevier Interactive Patient Education 2016 Elsevier Inc.  

## 2014-11-01 NOTE — Progress Notes (Signed)
Patient states he has been out of medications for past 3 months. Patient only been taking aspirin and nitro (if needed)  Patient states he has pain in kidneys, across lower abdomen, and left side. Pain is scaled at a 4, described as aching intermittently.   Patient states his sugar levels have been good even though he has not had his metformin.

## 2014-11-02 LAB — URIC ACID: URIC ACID, SERUM: 7.5 mg/dL (ref 4.0–7.8)

## 2014-11-02 LAB — CBC WITH DIFFERENTIAL/PLATELET
BASOS PCT: 0 % (ref 0–1)
Basophils Absolute: 0 10*3/uL (ref 0.0–0.1)
EOS ABS: 0.2 10*3/uL (ref 0.0–0.7)
Eosinophils Relative: 4 % (ref 0–5)
HCT: 39.4 % (ref 39.0–52.0)
Hemoglobin: 12.7 g/dL — ABNORMAL LOW (ref 13.0–17.0)
Lymphocytes Relative: 57 % — ABNORMAL HIGH (ref 12–46)
Lymphs Abs: 3.2 10*3/uL (ref 0.7–4.0)
MCH: 27.5 pg (ref 26.0–34.0)
MCHC: 32.2 g/dL (ref 30.0–36.0)
MCV: 85.5 fL (ref 78.0–100.0)
MONOS PCT: 11 % (ref 3–12)
MPV: 9.8 fL (ref 8.6–12.4)
Monocytes Absolute: 0.6 10*3/uL (ref 0.1–1.0)
NEUTROS ABS: 1.6 10*3/uL — AB (ref 1.7–7.7)
NEUTROS PCT: 28 % — AB (ref 43–77)
PLATELETS: 311 10*3/uL (ref 150–400)
RBC: 4.61 MIL/uL (ref 4.22–5.81)
RDW: 14.3 % (ref 11.5–15.5)
WBC: 5.6 10*3/uL (ref 4.0–10.5)

## 2014-11-02 LAB — LIPID PANEL
CHOL/HDL RATIO: 3.6 ratio (ref <=5.0)
CHOLESTEROL: 197 mg/dL (ref 125–200)
HDL: 54 mg/dL (ref >=40)
LDL Cholesterol: 113 mg/dL (ref <130)
TRIGLYCERIDES: 152 mg/dL — AB (ref <150)
VLDL: 30 mg/dL (ref <30)

## 2014-11-02 LAB — BASIC METABOLIC PANEL
BUN: 18 mg/dL (ref 7–25)
CALCIUM: 10.2 mg/dL (ref 8.6–10.3)
CO2: 30 mmol/L (ref 20–31)
Chloride: 102 mmol/L (ref 98–110)
Creat: 1.65 mg/dL — ABNORMAL HIGH (ref 0.70–1.33)
GLUCOSE: 86 mg/dL (ref 65–99)
POTASSIUM: 5.3 mmol/L (ref 3.5–5.3)
SODIUM: 141 mmol/L (ref 135–146)

## 2014-11-02 LAB — MICROALBUMIN / CREATININE URINE RATIO
CREATININE, URINE: 125.9 mg/dL
MICROALB UR: 5.3 mg/dL — AB (ref <2.0)
MICROALB/CREAT RATIO: 42.1 mg/g — AB (ref 0.0–30.0)

## 2014-11-14 ENCOUNTER — Telehealth: Payer: Self-pay | Admitting: *Deleted

## 2014-11-14 NOTE — Telephone Encounter (Signed)
-----   Message from Quentin Angstlugbemiga E Jegede, MD sent at 11/14/2014 10:51 AM EDT ----- Please inform patient that his laboratory results are mostly within normal limits but his kidney function has declined, will need to schedule a follow-up appointment to repeat the kidney function, if it remains the same we may need to change some of his medications. Meanwhile advised patient to stay hydrated at all time and to take his medications as prescribed.  Please schedule patient for follow-up appointment.

## 2014-11-14 NOTE — Telephone Encounter (Signed)
Patient verified DOB Patient informed of lab results being mostly with within normal limits. Patient advised of kidney functioning declining. Patient informed of needing a kidney function recheck. Medical Assistant informed patient of medications possibly being changed if the results remain the same. Patient advised to stay hydrated and continue taking medications as prescribed. Patient was transferred to front desk to schedule follow-up appointment. Patient expressed understanding and had no further questions.

## 2014-11-19 ENCOUNTER — Ambulatory Visit: Payer: Self-pay | Attending: Internal Medicine | Admitting: Internal Medicine

## 2014-11-19 ENCOUNTER — Encounter: Payer: Self-pay | Admitting: Internal Medicine

## 2014-11-19 VITALS — Ht 71.0 in | Wt 159.0 lb

## 2014-11-19 DIAGNOSIS — E7800 Pure hypercholesterolemia, unspecified: Secondary | ICD-10-CM | POA: Insufficient documentation

## 2014-11-19 DIAGNOSIS — Z7984 Long term (current) use of oral hypoglycemic drugs: Secondary | ICD-10-CM | POA: Insufficient documentation

## 2014-11-19 DIAGNOSIS — I739 Peripheral vascular disease, unspecified: Secondary | ICD-10-CM | POA: Insufficient documentation

## 2014-11-19 DIAGNOSIS — Z7982 Long term (current) use of aspirin: Secondary | ICD-10-CM | POA: Insufficient documentation

## 2014-11-19 DIAGNOSIS — J45909 Unspecified asthma, uncomplicated: Secondary | ICD-10-CM | POA: Insufficient documentation

## 2014-11-19 DIAGNOSIS — I10 Essential (primary) hypertension: Secondary | ICD-10-CM | POA: Insufficient documentation

## 2014-11-19 DIAGNOSIS — I252 Old myocardial infarction: Secondary | ICD-10-CM | POA: Insufficient documentation

## 2014-11-19 DIAGNOSIS — E119 Type 2 diabetes mellitus without complications: Secondary | ICD-10-CM | POA: Insufficient documentation

## 2014-11-19 DIAGNOSIS — K219 Gastro-esophageal reflux disease without esophagitis: Secondary | ICD-10-CM | POA: Insufficient documentation

## 2014-11-19 DIAGNOSIS — Z79899 Other long term (current) drug therapy: Secondary | ICD-10-CM | POA: Insufficient documentation

## 2014-11-19 LAB — GLUCOSE, POCT (MANUAL RESULT ENTRY): POC GLUCOSE: 182 mg/dL — AB (ref 70–99)

## 2014-11-19 NOTE — Progress Notes (Signed)
Patient ID: Willie Walker, male   DOB: 04/29/1965, 49 y.o.   MRN: [id number]   Lucio EdwardDaryle Walker, is a 49 y.o. male  [id number]  [id number]  DOB - 05/29/1965  No chief complaint on file.       Subjective:   Willie Walker is a 49 y.o. male with history of type 2 diabetes mellitus, hypertension, gout and coronary artery disease status post multiple stenting here today for a follow up visit. Patient established medical care here 2 weeks ago, some lab testings were done and patient is here today for lab results and follow-up. His laboratory results shows declined kidney function. Patient requesting clarity on kidney function declining, asking if decline is related to being out of medications for 5 months. Patient complains of aching in lower back. Patient has No headache, No chest pain, No abdominal pain - No Nausea, No new weakness tingling or numbness, No Cough - SOB.   ALLERGIES: Allergies  Allergen Reactions  . Crestor [Rosuvastatin] Other (See Comments)    "Makes my legs hurt" - tolerates simvastatin     PAST MEDICAL HISTORY: Past Medical History  Diagnosis Date  . ELECTROCARDIOGRAM, ABNORMAL   . Coronary artery disease   . Anginal pain (HCC)   . Hypertension   . GERD (gastroesophageal reflux disease)   . High cholesterol   . Peripheral vascular disease (HCC)     LLE  . Myocardial infarction (HCC) 10/2005  . Asthma     "had it; outgrew it"  . Pneumonia 1990's  . Shortness of breath 09/18/11    "@ rest; lying down; w/exertion"  . Type I diabetes mellitus (HCC)   . Lower GI bleed 09/18/2011    "clots and everything; not lately"  . Headache(784.0) 09/18/2011    "maybe twice/wk; pressure on the brain"  . Seizures (HCC) 09/18/2011    "blanks out on me"/wife's report  . Chronic lower back pain   . Gout   . Anxiety     MEDICATIONS AT HOME: Prior to Admission medications   Medication Sig Start Date End Date Taking? Authorizing Provider  allopurinol (ZYLOPRIM) 100 MG  tablet Take 1 tablet (100 mg total) by mouth daily. 11/01/14   Quentin Angstlugbemiga E Jerine Surles, MD  aspirin EC 81 MG tablet Take 1 tablet (81 mg total) by mouth daily. 02/09/13   Richarda OverlieNayana Abrol, MD  carvedilol (COREG) 3.125 MG tablet Take 1 tablet (3.125 mg total) by mouth 2 (two) times daily with a meal. 11/01/14   Quentin Angstlugbemiga E Buckley Bradly, MD  colchicine 0.6 MG tablet Take 1 tablet (0.6 mg total) by mouth daily. 11/01/14   Quentin Angstlugbemiga E Keyonni Percival, MD  fish oil-omega-3 fatty acids 1000 MG capsule Take 1 g by mouth 2 (two) times daily.    Historical Provider, MD  lisinopril (PRINIVIL,ZESTRIL) 10 MG tablet Take 1 tablet (10 mg total) by mouth daily. 11/01/14   Quentin Angstlugbemiga E Elfreida Heggs, MD  metFORMIN (GLUCOPHAGE) 500 MG tablet Take 1 tablet (500 mg total) by mouth 2 (two) times daily with a meal. 11/01/14   Quentin Angstlugbemiga E Maanav Kassabian, MD  nitroGLYCERIN (NITROSTAT) 0.4 MG SL tablet Place 1 tablet (0.4 mg total) under the tongue every 5 (five) minutes x 3 doses as needed for chest pain. 12/21/13 10/19/15  Wendall StadePeter C Nishan, MD     Objective:   Willie Walker Vitals:   11/19/14 1107  Height: 5\' 11"  (1.803 m)  Weight: 159 lb (72.122 kg)    Exam General appearance : Awake, alert, not in any distress. Speech Clear.  Not toxic looking HEENT: Atraumatic and Normocephalic, pupils equally reactive to light and accomodation Neck: supple, no JVD. No cervical lymphadenopathy.  Chest:Good air entry bilaterally, no added sounds  CVS: S1 S2 regular, no murmurs.  Abdomen: Bowel sounds present, Non tender and not distended with no gaurding, rigidity or rebound. Extremities: B/L Lower Ext shows no edema, both legs are warm to touch Neurology: Awake alert, and oriented X 3, CN II-XII intact, Non focal  Data Review Lab Results  Component Value Date   HGBA1C 5.90 11/01/2014   HGBA1C 5.7 02/09/2013   HGBA1C 5.9 12/20/2012     Assessment & Plan   1. Type 2 diabetes mellitus without complication, without long-term current use of insulin (HCC)  -  Glucose (CBG) Aim for 30 minutes of exercise most days. Rethink what you drink. Water is great! Aim for 2-3 Carb Choices per meal (30-45 grams) +/- 1 either way  Aim for 0-15 Carbs per snack if hungry  Include protein in moderation with your meals and snacks  Consider reading food labels for Total Carbohydrate and Fat Grams of foods  Consider checking BG at alternate times per day  Continue taking medication as directed Be mindful about how much sugar you are adding to beverages and other foods. Fruit Punch - find one with no sugar  Measure and decrease portions of carbohydrate foods  Make your plate and don't go back for seconds   2. Essential hypertension, benign  - US Renal; Future  We have discussed target BP range and blood pressure goal. I have advised patient to check BP regularly and to call us back or report to clinic if the numbers are consistently higher than 140/90. We discussed the importance of compliance with medical therapy and DASH diet recommended, consequences of uncontrolled hypertension discussed.   - continue current BP medications  Patient have been counseled extensively about nutrition and exercise  Return in about 4 weeks (around 12/17/2014) for Abdominal Pain, Follow up HTN.  The patient was given clear instructions to go to ER or return to medical center if symptoms don't improve, worsen or new problems develop. The patient verbalized understanding. The patient was told to call to get lab results if they haven't heard anything in the next week.   This note has been created with Education officer, environmental. Any transcriptional errors are unintentional.    Jeanann Lewandowsky, MD, MHA, FACP, FAAP, CPE [facility] [place], [place] [phone]   11/19/2014, 11:44 AM

## 2014-11-19 NOTE — Progress Notes (Signed)
Patient is aware of his lab results. Patient requesting clarity on kidney function declining. Patient asking if decline is related to being out of medications for 5 months. Patient complains of aching in lower back.

## 2014-11-19 NOTE — Patient Instructions (Signed)
Hypertension Hypertension, commonly called high blood pressure, is when the force of blood pumping through your arteries is too strong. Your arteries are the blood vessels that carry blood from your heart throughout your body. A blood pressure reading consists of a higher number over a lower number, such as 110/72. The higher number (systolic) is the pressure inside your arteries when your heart pumps. The lower number (diastolic) is the pressure inside your arteries when your heart relaxes. Ideally you want your blood pressure below 120/80. Hypertension forces your heart to work harder to pump blood. Your arteries may become narrow or stiff. Having untreated or uncontrolled hypertension can cause heart attack, stroke, kidney disease, and other problems. RISK FACTORS Some risk factors for high blood pressure are controllable. Others are not.  Risk factors you cannot control include:   Race. You may be at higher risk if you are African American.  Age. Risk increases with age.  Gender. Men are at higher risk than women before age 45 years. After age 65, women are at higher risk than men. Risk factors you can control include:  Not getting enough exercise or physical activity.  Being overweight.  Getting too much fat, sugar, calories, or salt in your diet.  Drinking too much alcohol. SIGNS AND SYMPTOMS Hypertension does not usually cause signs or symptoms. Extremely high blood pressure (hypertensive crisis) may cause headache, anxiety, shortness of breath, and nosebleed. DIAGNOSIS To check if you have hypertension, your health care provider will measure your blood pressure while you are seated, with your arm held at the level of your heart. It should be measured at least twice using the same arm. Certain conditions can cause a difference in blood pressure between your right and left arms. A blood pressure reading that is higher than normal on one occasion does not mean that you need treatment. If  it is not clear whether you have high blood pressure, you may be asked to return on a different day to have your blood pressure checked again. Or, you may be asked to monitor your blood pressure at home for 1 or more weeks. TREATMENT Treating high blood pressure includes making lifestyle changes and possibly taking medicine. Living a healthy lifestyle can help lower high blood pressure. You may need to change some of your habits. Lifestyle changes may include:  Following the DASH diet. This diet is high in fruits, vegetables, and whole grains. It is low in salt, red meat, and added sugars.  Keep your sodium intake below 2,300 mg per day.  Getting at least 30-45 minutes of aerobic exercise at least 4 times per week.  Losing weight if necessary.  Not smoking.  Limiting alcoholic beverages.  Learning ways to reduce stress. Your health care provider may prescribe medicine if lifestyle changes are not enough to get your blood pressure under control, and if one of the following is true:  You are 18-59 years of age and your systolic blood pressure is above 140.  You are 60 years of age or older, and your systolic blood pressure is above 150.  Your diastolic blood pressure is above 90.  You have diabetes, and your systolic blood pressure is over 140 or your diastolic blood pressure is over 90.  You have kidney disease and your blood pressure is above 140/90.  You have heart disease and your blood pressure is above 140/90. Your personal target blood pressure may vary depending on your medical conditions, your age, and other factors. HOME CARE INSTRUCTIONS    Have your blood pressure rechecked as directed by your health care provider.   Take medicines only as directed by your health care provider. Follow the directions carefully. Blood pressure medicines must be taken as prescribed. The medicine does not work as well when you skip doses. Skipping doses also puts you at risk for  problems.  Do not smoke.   Monitor your blood pressure at home as directed by your health care provider. SEEK MEDICAL CARE IF:   You think you are having a reaction to medicines taken.  You have recurrent headaches or feel dizzy.  You have swelling in your ankles.  You have trouble with your vision. SEEK IMMEDIATE MEDICAL CARE IF:  You develop a severe headache or confusion.  You have unusual weakness, numbness, or feel faint.  You have severe chest or abdominal pain.  You vomit repeatedly.  You have trouble breathing. MAKE SURE YOU:   Understand these instructions.  Will watch your condition.  Will get help right away if you are not doing well or get worse.   This information is not intended to replace advice given to you by your health care provider. Make sure you discuss any questions you have with your health care provider.   Document Released: 01/05/2005 Document Revised: 05/22/2014 Document Reviewed: 10/28/2012 Elsevier Interactive Patient Education 2016 Elsevier Inc.  

## 2014-11-26 ENCOUNTER — Ambulatory Visit (HOSPITAL_COMMUNITY)
Admission: RE | Admit: 2014-11-26 | Discharge: 2014-11-26 | Disposition: A | Discharge status: Home / Self Care | Payer: Self-pay | Source: Ambulatory Visit | Attending: Internal Medicine | Admitting: Internal Medicine

## 2014-11-26 DIAGNOSIS — I10 Essential (primary) hypertension: Secondary | ICD-10-CM | POA: Insufficient documentation

## 2014-11-26 DIAGNOSIS — N281 Cyst of kidney, acquired: Secondary | ICD-10-CM | POA: Insufficient documentation

## 2014-11-26 DIAGNOSIS — E119 Type 2 diabetes mellitus without complications: Secondary | ICD-10-CM | POA: Insufficient documentation

## 2014-11-26 NOTE — Progress Notes (Signed)
   Subjective:   HPI  Review of Systems  Objective:  Physical Exam  Assessment & Plan:   ERROR. Please Disregard.

## 2014-11-29 ENCOUNTER — Ambulatory Visit: Payer: Self-pay | Admitting: Internal Medicine

## 2014-12-10 ENCOUNTER — Telehealth: Payer: Self-pay | Admitting: *Deleted

## 2014-12-10 NOTE — Telephone Encounter (Signed)
-----   Message from Quentin Angstlugbemiga E Jegede, MD sent at 12/05/2014  8:59 AM EST ----- Please inform patient that his kidney ultrasound showed no acute findings, there is a mildly complex renal cyst on the left, the we'll repeat ultrasound to monitor this cyst in 4-6 months to document stability.

## 2014-12-10 NOTE — Telephone Encounter (Signed)
Patient verified DOB Patient informed of kidney ultrasound showing no acute findings. Patient informed of a mildly complex cyst being on the left kidney. Patient informed of ultrasound being repeated in 4-6 months to document the cyst stability. Patient advised to schedule appointment if he has any significant discomfort of concerns between the next ultrasound. Patient had no further questions.

## 2015-01-24 MED FILL — metFORMIN HCL 500 MG TABS: 500 | 30 days supply | Qty: 60 | Fill #1

## 2015-01-24 MED FILL — CARVEDILOL 3.125 MG TABLET: 3.125 | 30 days supply | Qty: 60 | Fill #1

## 2015-01-24 MED FILL — ?LISINOPRIL 10 MG TABLET: 10 | 30 days supply | Qty: 30 | Fill #1

## 2015-02-26 MED FILL — metFORMIN HCL 500 MG TABS: 500 | 30 days supply | Qty: 60 | Fill #2

## 2015-02-26 MED FILL — CARVEDILOL 3.125 MG TABLET: 3.125 | 30 days supply | Qty: 60 | Fill #2

## 2015-02-26 MED FILL — ?LISINOPRIL 10 MG TABLET: 10 | 30 days supply | Qty: 30 | Fill #2

## 2015-02-26 MED FILL — ?ALLOPURINOL 100 MG TABLET: 100 | 30 days supply | Qty: 30 | Fill #1

## 2015-04-19 MED FILL — ?CARVEDILOL 3.125 MG TABLET: 3.125 | 30 days supply | Qty: 60 | Fill #3

## 2015-04-19 MED FILL — ?LISINOPRIL 10 MG TABLET: 10 | 30 days supply | Qty: 30 | Fill #3

## 2015-04-19 MED FILL — ?METFORMIN HCL 500MG TABLET: 500 | 30 days supply | Qty: 60 | Fill #3

## 2015-04-19 MED FILL — ?ALLOPURINOL 100 MG TABLET: 100 | 30 days supply | Qty: 30 | Fill #2

## 2015-06-07 MED FILL — LISINOPRIL 10 MG TABLET: 10 | 30 days supply | Qty: 30 | Fill #4

## 2015-06-07 MED FILL — ?ALLOPURINOL 100MG TABLET: 100 | 30 days supply | Qty: 30 | Fill #3

## 2015-06-07 MED FILL — ?METFORMIN HCL 500MG TABLET: 500 | 30 days supply | Qty: 60 | Fill #4

## 2015-06-07 MED FILL — ?CARVEDILOL 3.125 MG TABLET: 3.125 | 30 days supply | Qty: 60 | Fill #4

## 2015-07-10 MED FILL — ALLOPURINOL 100 MG TABLET: 100 | 30 days supply | Qty: 30 | Fill #4

## 2015-07-10 MED FILL — LISINOPRIL 10 MG TABLET: 10 | 30 days supply | Qty: 30 | Fill #5

## 2015-07-10 MED FILL — ?METFORMIN HCL 500MG TABLET: 500 | 30 days supply | Qty: 60 | Fill #5

## 2015-07-25 ENCOUNTER — Ambulatory Visit: Payer: Self-pay | Attending: Internal Medicine | Admitting: Internal Medicine

## 2015-07-25 ENCOUNTER — Other Ambulatory Visit: Payer: Self-pay

## 2015-07-25 DIAGNOSIS — Z888 Allergy status to other drugs, medicaments and biological substances status: Secondary | ICD-10-CM | POA: Insufficient documentation

## 2015-07-25 DIAGNOSIS — N281 Cyst of kidney, acquired: Secondary | ICD-10-CM | POA: Insufficient documentation

## 2015-07-25 DIAGNOSIS — Z79899 Other long term (current) drug therapy: Secondary | ICD-10-CM | POA: Insufficient documentation

## 2015-07-25 DIAGNOSIS — R0789 Other chest pain: Secondary | ICD-10-CM | POA: Insufficient documentation

## 2015-07-25 DIAGNOSIS — Z7984 Long term (current) use of oral hypoglycemic drugs: Secondary | ICD-10-CM | POA: Insufficient documentation

## 2015-07-25 DIAGNOSIS — I10 Essential (primary) hypertension: Secondary | ICD-10-CM | POA: Insufficient documentation

## 2015-07-25 DIAGNOSIS — K219 Gastro-esophageal reflux disease without esophagitis: Secondary | ICD-10-CM | POA: Insufficient documentation

## 2015-07-25 DIAGNOSIS — E119 Type 2 diabetes mellitus without complications: Secondary | ICD-10-CM | POA: Insufficient documentation

## 2015-07-25 DIAGNOSIS — Q6100 Congenital renal cyst, unspecified: Secondary | ICD-10-CM

## 2015-07-25 LAB — POCT GLYCOSYLATED HEMOGLOBIN (HGB A1C): Hemoglobin A1C: 5.7

## 2015-07-25 LAB — GLUCOSE, POCT (MANUAL RESULT ENTRY): POC Glucose: 103 mg/dl — AB (ref 70–99)

## 2015-07-25 MED ORDER — CARVEDILOL 3.125 MG PO TABS: 3.1250 mg | ORAL_TABLET | Freq: Two times a day (BID) | ORAL | Status: DC

## 2015-07-25 NOTE — Progress Notes (Signed)
Patient ID: Willie Walker, male   DOB: 04/19/1965, 50 y.o.   MRN: [id number]   Willie Walker, is a 50 y.o. male  [id number]  [id number]  DOB - 03/29/1965  No chief complaint on file.       Subjective:   Willie Walker is a 50 y.o. male with history of T2DM, CAD s/p PCI, HTN, hyperlipidemia, gout, PVD and ongoing polysubstance abuse who is here today for a follow up visit. Patient is complaining of on and off chest discomfort x few weeks. The pain is mostly mid chest, does not radiate to left arm or neck, no diaphoresis but occasionally have SOB. Chest pain is not particularly related to activity. He has an appointment coming up with the cardiologist. Patient has No headache, No abdominal pain - No Nausea, No new weakness tingling or numbness, No Cough - SOB. No fever, no palpitations, no PND, no leg edema. He needs refill on some of his medications today, has not taken his carvedilol in over one month. He continues to smoke marijuana and uses cocaine frequently but says he no longer smoke cigarette.  Problem  Type 2 Diabetes Mellitus Without Complication, Without Long-Term Current Use of Insulin (Hcc)  Renal Cyst    ALLERGIES: Allergies  Allergen Reactions  . Crestor [Rosuvastatin] Other (See Comments)    "Makes my legs hurt" - tolerates simvastatin     PAST MEDICAL HISTORY: Past Medical History  Diagnosis Date  . ELECTROCARDIOGRAM, ABNORMAL   . Coronary artery disease   . Anginal pain (HCC)   . Hypertension   . GERD (gastroesophageal reflux disease)   . High cholesterol   . Peripheral vascular disease (HCC)     LLE  . Myocardial infarction (HCC) 10/2005  . Asthma     "had it; outgrew it"  . Pneumonia 1990's  . Shortness of breath 09/18/11    "@ rest; lying down; w/exertion"  . Type I diabetes mellitus (HCC)   . Lower GI bleed 09/18/2011    "clots and everything; not lately"  . Headache(784.0) 09/18/2011    "maybe twice/wk; pressure on the brain"  . Seizures (HCC)  09/18/2011    "blanks out on me"/wife's report  . Chronic lower back pain   . Gout   . Anxiety     MEDICATIONS AT HOME: Prior to Admission medications   Medication Sig Start Date End Date Taking? Authorizing Provider  allopurinol (ZYLOPRIM) 100 MG tablet Take 1 tablet (100 mg total) by mouth daily. 11/01/14   Willie Angstlugbemiga E Analiah Drum, MD  aspirin EC 81 MG tablet Take 1 tablet (81 mg total) by mouth daily. 02/09/13   Willie OverlieNayana Abrol, MD  carvedilol (COREG) 3.125 MG tablet Take 1 tablet (3.125 mg total) by mouth 2 (two) times daily with a meal. 07/25/15   Willie Angstlugbemiga E Anivea Velasques, MD  colchicine 0.6 MG tablet Take 1 tablet (0.6 mg total) by mouth daily. 11/01/14   Willie Angstlugbemiga E Nettye Flegal, MD  fish oil-omega-3 fatty acids 1000 MG capsule Take 1 g by mouth 2 (two) times daily.    Historical Provider, MD  lisinopril (PRINIVIL,ZESTRIL) 10 MG tablet Take 1 tablet (10 mg total) by mouth daily. 11/01/14   Willie Angstlugbemiga E Yeshaya Vath, MD  metFORMIN (GLUCOPHAGE) 500 MG tablet Take 1 tablet (500 mg total) by mouth 2 (two) times daily with a meal. 11/01/14   Willie Angstlugbemiga E Dilyn Smiles, MD  nitroGLYCERIN (NITROSTAT) 0.4 MG SL tablet Place 1 tablet (0.4 mg total) under the tongue every 5 (five) minutes x 3 doses  as needed for chest pain. 12/21/13 10/19/15  Wendall StadePeter C Nishan, MD     Objective:   There were no vitals filed for this visit.  Exam General appearance : Awake, alert, not in any distress. Speech Clear. Not toxic looking HEENT: Atraumatic and Normocephalic, pupils equally reactive to light and accomodation Neck: Supple, no JVD. No cervical lymphadenopathy.  Chest: Good air entry bilaterally, no added sounds  CVS: S1 S2 regular, no murmurs.  Abdomen: Bowel sounds present, Non tender and not distended with no gaurding, rigidity or rebound. Extremities: B/L Lower Ext shows no edema, both legs are warm to touch Neurology: Awake alert, and oriented X 3, CN II-XII intact, Non focal Skin: No Rash  Data Review Lab Results  Component  Value Date   HGBA1C 5.90 11/01/2014   HGBA1C 5.7 02/09/2013   HGBA1C 5.9 12/20/2012     Assessment & Plan   1. Type 2 diabetes mellitus without complication, without long-term current use of insulin (HCC)  Patient's HbA1C is 5.9% today, he insists on staying on Metformin, will continue for now and if A1C goes down or remain below 6, will reevaluate continued use of medication Vs Dietary control.  Aim for 30 minutes of exercise most days. Rethink what you drink. Water is great! Aim for 2-3 Carb Choices per meal (30-45 grams) +/- 1 either way  Aim for 0-15 Carbs per snack if hungry  Include protein in moderation with your meals and snacks  Consider reading food labels for Total Carbohydrate and Fat Grams of foods  Consider checking BG at alternate times per day  Continue taking medication as directed Be mindful about how much sugar you are adding to beverages and other foods. Fruit Punch - find one with no sugar  Measure and decrease portions of carbohydrate foods  Make your plate and don't go back for seconds   2. Essential hypertension:  - COMPLETE METABOLIC PANEL WITH GFR - carvedilol (COREG) 3.125 MG tablet; Take 1 tablet (3.125 mg total) by mouth 2 (two) times daily with a meal.  Dispense: 180 tablet; Refill: 3  We have discussed target BP range and blood pressure goal. I have advised patient to check BP regularly and to call us back or report to clinic if the numbers are consistently higher than 140/90. We discussed the importance of compliance with medical therapy and DASH diet recommended, consequences of uncontrolled hypertension discussed.  - continue current BP medications  3. Renal cyst  - US Renal; Future  4. Chest Pressure x 4 months  EKG reviewed by me, NSR, no specific ST-T wave changes Continue Aspirin Follow up with Cardiologist as scheduled Stop Smoking  Willie Walker was counseled on the dangers of tobacco use, and was advised to quit. Reviewed strategies to  maximize success, including removing cigarettes and smoking materials from environment, stress management and support of family/friends.  Patient have been counseled extensively about nutrition and exercise  Return in about 3 months (around 10/25/2015) for Follow up HTN, Follow up Pain and comorbidities.  The patient was given clear instructions to go to ER or return to medical center if symptoms don't improve, worsen or new problems develop. The patient verbalized understanding. The patient was told to call to get lab results if they haven't heard anything in the next week.   This note has been created with Education officer, environmentalDragon speech recognition software and smart phrase technology. Any transcriptional errors are unintentional.    Auda Finfrock, MD, MHA, FACP, FAAP, CPE [facility] [place], [place] [phone]   07/25/2015, 3:52 PM

## 2015-07-25 NOTE — Patient Instructions (Signed)
Diabetes and Exercise Exercising regularly is important. It is not just about losing weight. It has many health benefits, such as:  Improving your overall fitness, flexibility, and endurance.  Increasing your bone density.  Helping with weight control.  Decreasing your body fat.  Increasing your muscle strength.  Reducing stress and tension.  Improving your overall health. People with diabetes who exercise gain additional benefits because exercise:  Reduces appetite.  Improves the body's use of blood sugar (glucose).  Helps lower or control blood glucose.  Decreases blood pressure.  Helps control blood lipids (such as cholesterol and triglycerides).  Improves the body's use of the hormone insulin by:  Increasing the body's insulin sensitivity.  Reducing the body's insulin needs.  Decreases the risk for heart disease because exercising:  Lowers cholesterol and triglycerides levels.  Increases the levels of good cholesterol (such as high-density lipoproteins [HDL]) in the body.  Lowers blood glucose levels. YOUR ACTIVITY PLAN  Choose an activity that you enjoy, and set realistic goals. To exercise safely, you should begin practicing any new physical activity slowly, and gradually increase the intensity of the exercise over time. Your health care provider or diabetes educator can help create an activity plan that works for you. General recommendations include:  Encouraging children to engage in at least 60 minutes of physical activity each day.  Stretching and performing strength training exercises, such as yoga or weight lifting, at least 2 times per week.  Performing a total of at least 150 minutes of moderate-intensity exercise each week, such as brisk walking or water aerobics.  Exercising at least 3 days per week, making sure you allow no more than 2 consecutive days to pass without exercising.  Avoiding long periods of inactivity (90 minutes or more). When you  have to spend an extended period of time sitting down, take frequent breaks to walk or stretch. RECOMMENDATIONS FOR EXERCISING WITH TYPE 1 OR TYPE 2 DIABETES   Check your blood glucose before exercising. If blood glucose levels are greater than 240 mg/dL, check for urine ketones. Do not exercise if ketones are present.  Avoid injecting insulin into areas of the body that are going to be exercised. For example, avoid injecting insulin into:  The arms when playing tennis.  The legs when jogging.  Keep a record of:  Food intake before and after you exercise.  Expected peak times of insulin action.  Blood glucose levels before and after you exercise.  The type and amount of exercise you have done.  Review your records with your health care provider. Your health care provider will help you to develop guidelines for adjusting food intake and insulin amounts before and after exercising.  If you take insulin or oral hypoglycemic agents, watch for signs and symptoms of hypoglycemia. They include:  Dizziness.  Shaking.  Sweating.  Chills.  Confusion.  Drink plenty of water while you exercise to prevent dehydration or heat stroke. Body water is lost during exercise and must be replaced.  Talk to your health care provider before starting an exercise program to make sure it is safe for you. Remember, almost any type of activity is better than none.   This information is not intended to replace advice given to you by your health care provider. Make sure you discuss any questions you have with your health care provider.   Document Released: 03/28/2003 Document Revised: 05/22/2014 Document Reviewed: 06/14/2012 Elsevier Interactive Patient Education 2016 Elsevier Inc. Basic Carbohydrate Counting for Diabetes Mellitus Carbohydrate counting   is a method for keeping track of the amount of carbohydrates you eat. Eating carbohydrates naturally increases the level of sugar (glucose) in your  blood, so it is important for you to know the amount that is okay for you to have in every meal. Carbohydrate counting helps keep the level of glucose in your blood within normal limits. The amount of carbohydrates allowed is different for every person. A dietitian can help you calculate the amount that is right for you. Once you know the amount of carbohydrates you can have, you can count the carbohydrates in the foods you want to eat. Carbohydrates are found in the following foods:  Grains, such as breads and cereals.  Dried beans and soy products.  Starchy vegetables, such as potatoes, peas, and corn.  Fruit and fruit juices.  Milk and yogurt.  Sweets and snack foods, such as cake, cookies, candy, chips, soft drinks, and fruit drinks. CARBOHYDRATE COUNTING There are two ways to count the carbohydrates in your food. You can use either of the methods or a combination of both. Reading the "Nutrition Facts" on Packaged Food The "Nutrition Facts" is an area that is included on the labels of almost all packaged food and beverages in the [place]. It includes the serving size of that food or beverage and information about the nutrients in each serving of the food, including the grams (g) of carbohydrate per serving.  Decide the number of servings of this food or beverage that you will be able to eat or drink. Multiply that number of servings by the number of grams of carbohydrate that is listed on the label for that serving. The total will be the amount of carbohydrates you will be having when you eat or drink this food or beverage. Learning Standard Serving Sizes of Food When you eat food that is not packaged or does not include "Nutrition Facts" on the label, you need to measure the servings in order to count the amount of carbohydrates.A serving of most carbohydrate-rich foods contains about 15 g of carbohydrates. The following list includes serving sizes of carbohydrate-rich foods that  provide 15 g ofcarbohydrate per serving:   1 slice of bread (1 oz) or 1 six-inch tortilla.    of a hamburger bun or English muffin.  4-6 crackers.   cup unsweetened dry cereal.    cup hot cereal.   cup rice or pasta.    cup mashed potatoes or  of a large baked potato.  1 cup fresh fruit or one small piece of fruit.    cup canned or frozen fruit or fruit juice.  1 cup milk.   cup plain fat-free yogurt or yogurt sweetened with artificial sweeteners.   cup cooked dried beans or starchy vegetable, such as peas, corn, or potatoes.  Decide the number of standard-size servings that you will eat. Multiply that number of servings by 15 (the grams of carbohydrates in that serving). For example, if you eat 2 cups of strawberries, you will have eaten 2 servings and 30 g of carbohydrates (2 servings x 15 g = 30 g). For foods such as soups and casseroles, in which more than one food is mixed in, you will need to count the carbohydrates in each food that is included. EXAMPLE OF CARBOHYDRATE COUNTING Sample Dinner  3 oz chicken breast.   cup of brown rice.   cup of corn.  1 cup milk.   1 cup strawberries with sugar-free whipped topping.  Carbohydrate Calculation Step   1: Identify the foods that contain carbohydrates:   Rice.   Corn.   Milk.   Strawberries. Step 2:Calculate the number of servings eaten of each:   2 servings of rice.   1 serving of corn.   1 serving of milk.   1 serving of strawberries. Step 3: Multiply each of those number of servings by 15 g:   2 servings of rice x 15 g = 30 g.   1 serving of corn x 15 g = 15 g.   1 serving of milk x 15 g = 15 g.   1 serving of strawberries x 15 g = 15 g. Step 4: Add together all of the amounts to find the total grams of carbohydrates eaten: 30 g + 15 g + 15 g + 15 g = 75 g.   This information is not intended to replace advice given to you by your health care provider. Make sure you  discuss any questions you have with your health care provider.   Document Released: 01/05/2005 Document Revised: 01/26/2014 Document Reviewed: 12/02/2012 Elsevier Interactive Patient Education 2016 Elsevier Inc.  

## 2015-07-26 LAB — COMPLETE METABOLIC PANEL WITH GFR
ALBUMIN: 4.2 g/dL (ref 3.6–5.1)
ALK PHOS: 70 U/L (ref 40–115)
ALT: 13 U/L (ref 9–46)
AST: 20 U/L (ref 10–35)
BILIRUBIN TOTAL: 0.2 mg/dL (ref 0.2–1.2)
BUN: 25 mg/dL (ref 7–25)
CO2: 26 mmol/L (ref 20–31)
Calcium: 9.3 mg/dL (ref 8.6–10.3)
Chloride: 105 mmol/L (ref 98–110)
Creat: 1.34 mg/dL — ABNORMAL HIGH (ref 0.70–1.33)
GFR, Est African American: 70 mL/min (ref >=60)
GFR, Est Non African American: 60 mL/min (ref >=60)
GLUCOSE: 84 mg/dL (ref 65–99)
POTASSIUM: 4.7 mmol/L (ref 3.5–5.3)
SODIUM: 138 mmol/L (ref 135–146)
TOTAL PROTEIN: 7.3 g/dL (ref 6.1–8.1)

## 2015-07-29 ENCOUNTER — Telehealth: Payer: Self-pay | Admitting: Cardiovascular Disease

## 2015-07-29 NOTE — Telephone Encounter (Signed)
Pt c/o of Chest Pain: 1. Are you having CP right now? NO 2. Are you experiencing any other symptoms (ex. SOB, nausea, vomiting, sweating)? Shortness of Breath , Chest Discomfort 3. How long have you been experiencing CP? 2wks 4. Is your CP continuous or coming and going? Comes and goes  5. Have you taken Nitroglycerin?NO

## 2015-07-29 NOTE — Telephone Encounter (Signed)
Calling stating he has been having some chest discomfort x 2 wks off and on.  States discomfort is in mid chest; does not radiate to (L) arm or neck.  States occ has pain in (R) side of neck.  States he has not taken NTG because he didn't feel like pain was that severe.   Also c/o of some SOB with activity. Chest discomfort happens at various times not related necessarily to activity.  States he has not lifted any thing heavy. Advised that if pain is constant he needs to go to ER.  He states that discomfort has not been severe.  Offered him an appointment to see Fannie KneeHao Meng,PA on Wed 7/12 at 11:30.  He will take that appointment.  This pt has not been seen since 12/21/2013.

## 2015-07-31 ENCOUNTER — Encounter: Payer: Self-pay | Admitting: Physician Assistant

## 2015-07-31 ENCOUNTER — Encounter (INDEPENDENT_AMBULATORY_CARE_PROVIDER_SITE_OTHER): Payer: Self-pay

## 2015-07-31 ENCOUNTER — Encounter: Payer: Self-pay | Admitting: *Deleted

## 2015-07-31 ENCOUNTER — Ambulatory Visit (INDEPENDENT_AMBULATORY_CARE_PROVIDER_SITE_OTHER): Payer: Self-pay | Admitting: Physician Assistant

## 2015-07-31 VITALS — BP 110/70 | HR 52 | Ht 71.0 in | Wt 159.0 lb

## 2015-07-31 DIAGNOSIS — I10 Essential (primary) hypertension: Secondary | ICD-10-CM

## 2015-07-31 DIAGNOSIS — E785 Hyperlipidemia, unspecified: Secondary | ICD-10-CM

## 2015-07-31 DIAGNOSIS — E119 Type 2 diabetes mellitus without complications: Secondary | ICD-10-CM

## 2015-07-31 DIAGNOSIS — I25118 Atherosclerotic heart disease of native coronary artery with other forms of angina pectoris: Secondary | ICD-10-CM

## 2015-07-31 DIAGNOSIS — R0789 Other chest pain: Secondary | ICD-10-CM

## 2015-07-31 DIAGNOSIS — M1000 Idiopathic gout, unspecified site: Secondary | ICD-10-CM

## 2015-07-31 LAB — CBC WITH DIFFERENTIAL/PLATELET
BASOS ABS: 60 {cells}/uL (ref 0–200)
Basophils Relative: 1 %
Eosinophils Absolute: 300 cells/uL (ref 15–500)
Eosinophils Relative: 5 %
HEMATOCRIT: 38.1 % — AB (ref 38.5–50.0)
HEMOGLOBIN: 12.7 g/dL — AB (ref 13.2–17.1)
LYMPHS ABS: 3480 {cells}/uL (ref 850–3900)
Lymphocytes Relative: 58 %
MCH: 29.3 pg (ref 27.0–33.0)
MCHC: 33.3 g/dL (ref 32.0–36.0)
MCV: 88 fL (ref 80.0–100.0)
MONO ABS: 660 {cells}/uL (ref 200–950)
MPV: 9.4 fL (ref 7.5–12.5)
Monocytes Relative: 11 %
NEUTROS PCT: 25 %
Neutro Abs: 1500 cells/uL (ref 1500–7800)
Platelets: 251 10*3/uL (ref 140–400)
RBC: 4.33 MIL/uL (ref 4.20–5.80)
RDW: 15.1 % — ABNORMAL HIGH (ref 11.0–15.0)
WBC: 6 10*3/uL (ref 3.8–10.8)

## 2015-07-31 LAB — LIPID PANEL
CHOL/HDL RATIO: 4.4 ratio (ref <=5.0)
Cholesterol: 232 mg/dL — ABNORMAL HIGH (ref 125–200)
HDL: 53 mg/dL (ref >=40)
LDL CALC: 159 mg/dL — AB (ref <130)
Triglycerides: 101 mg/dL (ref <150)
VLDL: 20 mg/dL (ref <30)

## 2015-07-31 LAB — PROTIME-INR
INR: 1
Prothrombin Time: 10.2 s (ref 9.0–11.5)

## 2015-07-31 LAB — HEPATIC FUNCTION PANEL
ALBUMIN: 4.1 g/dL (ref 3.6–5.1)
ALT: 15 U/L (ref 9–46)
AST: 27 U/L (ref 10–35)
Alkaline Phosphatase: 69 U/L (ref 40–115)
BILIRUBIN DIRECT: 0.1 mg/dL (ref <=0.2)
BILIRUBIN TOTAL: 0.3 mg/dL (ref 0.2–1.2)
Indirect Bilirubin: 0.2 mg/dL (ref 0.2–1.2)
Total Protein: 7.3 g/dL (ref 6.1–8.1)

## 2015-07-31 LAB — BASIC METABOLIC PANEL
BUN: 26 mg/dL — ABNORMAL HIGH (ref 7–25)
CO2: 24 mmol/L (ref 20–31)
Calcium: 9.9 mg/dL (ref 8.6–10.3)
Chloride: 107 mmol/L (ref 98–110)
Creat: 1.65 mg/dL — ABNORMAL HIGH (ref 0.70–1.33)
GLUCOSE: 95 mg/dL (ref 65–99)
POTASSIUM: 4.9 mmol/L (ref 3.5–5.3)
SODIUM: 138 mmol/L (ref 135–146)

## 2015-07-31 MED ORDER — NITROGLYCERIN 0.4 MG SL SUBL: 0.4000 mg | SUBLINGUAL_TABLET | SUBLINGUAL | Status: DC | PRN

## 2015-07-31 NOTE — Progress Notes (Signed)
Cardiology Office Note    Date:  07/31/2015   ID:  OZZY BOHLKEN, DOB 1965/04/08, MRN [id number]  PCP:  Jeanann Lewandowsky, MD  Cardiologist:  Dr. Eden Emms  Chief Complaint: Chest pain  History of Present Illness:   Willie Walker is a 50 y.o. male with hx of CAD, HTN, HL, HLD, PVD and ongoing polysubstance abuse who presented for evaluation of chest pain.   History of CAD and multiple PCI procedures in the past. He had several stents placed in [place] with full metal jacket in the RCA and stent to the intermediate.  LHC (8/292013) for chest pain showed:  dLM 30%, mLAD 60%, dLAD 40%, IM 30% ISR, D1 40-50%, OM/AV groove branch 95% ISR, pRCA 90% SIR, dRCA 80% ISR, EF 60%. Subsequently underwent cutting Balloon angioplasty to the mid RCA; Cutting Balloon angioplasty to the mid circumflex. Last seen by Dr. Eden Emms 12/2013. Per note patient patient was enrolled in STATUSFIRST trial 08/29/14.   He was added to [facility] today for chest pain. Substernal chest pressure with exertion for the past 3-5 months. Associated with SOB and diaphoresis. Intermittent radiation to L arm. No nausea. No rest pain. Complains of intermittent dizziness. He states that he hasn't smoker tabacco for the past 5 years however smokes marijuana 3-4 times/day and smokes cocaine 2 times/month. Last use 4th of July. Hasn't took coreg in past 6 weeks, states he runs out of this. Denies fever, chills, palpitations, orthopnea, PND, syncope, LE edema.   Past Medical History  Diagnosis Date  . ELECTROCARDIOGRAM, ABNORMAL   . Coronary artery disease   . Anginal pain (HCC)   . Hypertension   . GERD (gastroesophageal reflux disease)   . High cholesterol   . Peripheral vascular disease (HCC)     LLE  . Myocardial infarction (HCC) 10/2005  . Asthma     "had it; outgrew it"  . Pneumonia 1990's  . Shortness of breath 09/18/11    "@ rest; lying down; w/exertion"  . Type I diabetes mellitus (HCC)   . Lower GI bleed  09/18/2011    "clots and everything; not lately"  . Headache(784.0) 09/18/2011    "maybe twice/wk; pressure on the brain"  . Seizures (HCC) 09/18/2011    "blanks out on me"/wife's report  . Chronic lower back pain   . Gout   . Anxiety     Past Surgical History  Procedure Laterality Date  . Coronary angioplasty with stent placement  10/2005    "9"  . Coronary angioplasty  09/18/2011  . Ines Bloomer hole of cranium  1999    "mugged"  . Laceration repair  1999    BLE "muggine"  . Percutaneous coronary stent intervention (pci-s) N/A 09/18/2011    Procedure: PERCUTANEOUS CORONARY STENT INTERVENTION (PCI-S);  Surgeon: Peter M Swaziland, MD;  Location: [facility] C[facility] LAB;  Service: Cardiovascular;  Laterality: N/A;    Current Medications: Prior to Admission medications   Medication Sig Start Date End Date Taking? Authorizing Provider  allopurinol (ZYLOPRIM) 100 MG tablet Take 1 tablet (100 mg total) by mouth daily. 11/01/14  Yes Quentin Angst, MD  aspirin EC 81 MG tablet Take 1 tablet (81 mg total) by mouth daily. 02/09/13  Yes Richarda Overlie, MD  carvedilol (COREG) 3.125 MG tablet Take 1 tablet (3.125 mg total) by mouth 2 (two) times daily with a meal. 07/25/15  Yes Olugbemiga E Hyman Hopes, MD  colchicine 0.6 MG tablet Take 1 tablet (0.6 mg total) by mouth daily. 11/01/14  Yes Quentin Angst, MD  fish oil-omega-3 fatty acids 1000 MG capsule Take 1 g by mouth 2 (two) times daily.   Yes Historical Provider, MD  lisinopril (PRINIVIL,ZESTRIL) 10 MG tablet Take 1 tablet (10 mg total) by mouth daily. 11/01/14  Yes Quentin Angst, MD  metFORMIN (GLUCOPHAGE) 500 MG tablet Take 1 tablet (500 mg total) by mouth 2 (two) times daily with a meal. Patient taking differently: Take 500 mg by mouth daily with breakfast.  11/01/14  Yes Quentin Angst, MD  nitroGLYCERIN (NITROSTAT) 0.4 MG SL tablet Place 1 tablet (0.4 mg total) under the tongue every 5 (five) minutes x 3 doses as needed for chest pain. 12/21/13  10/19/15 Yes Wendall Stade, MD    Allergies:   Crestor   Social History   Social History  . Marital Status: Divorced    Spouse Name: N/A  . Number of Children: N/A  . Years of Education: N/A   Social History Main Topics  . Smoking status: Former Smoker -- 0.50 packs/day for 20 years    Types: Cigarettes    Quit date: 11/02/2005  . Smokeless tobacco: Never Used  . Alcohol Use: No     Comment: 09/18/2011 "twice a month; 6 shots at a time"  . Drug Use: Yes    Special: Cocaine, Marijuana, "Crack" cocaine     Comment: 09/18/2011 quit "06/11/2003"  . Sexual Activity: Yes   Other Topics Concern  . None   Social History Narrative     Family History:  The patient's family history includes CVA in his father; Dementia in his mother; Heart attack in his maternal grandmother; Hypertension in his mother.   ROS:   Please see the history of present illness.    ROS All other systems reviewed and are negative.   PHYSICAL EXAM:   VS:  Ht  (1.803 m)  Wt 159 lb (72.122 kg)  BMI 22.19 kg/m2   GEN: Well nourished, well developed, in no acute distress HEENT: normal Neck: no JVD, carotid bruits, or masses Cardiac: RRR; no murmurs, rubs, or gallops,no edema  Respiratory:  clear to auscultation bilaterally, normal work of breathing GI: soft, nontender, nondistended, + BS MS: no deformity or atrophy Skin: warm and dry, no rash Neuro:  Alert and Oriented x 3, Strength and sensation are intact Psych: euthymic mood, full affect  Wt Readings from Last 3 Encounters:  07/31/15 159 lb (72.122 kg)  11/19/14 159 lb (72.122 kg)  11/01/14 160 lb (72.576 kg)      Studies/Labs Reviewed:   EKG:  EKG is ordered today.  The ekg ordered today demonstrates sinus bradycardia at rate of 52 bpm and new TWI in leads V3 to V5.   Recent Labs: 11/01/2014: Hemoglobin 12.7*; Platelets 311 07/25/2015: ALT 13; BUN 25; Creat 1.34*; Potassium 4.7; Sodium 138   Lipid Panel    Component Value Date/Time    CHOL 197 11/01/2014 1747   TRIG 152* 11/01/2014 1747   HDL 54 11/01/2014 1747   CHOLHDL 3.6 11/01/2014 1747   VLDL 30 11/01/2014 1747   LDLCALC 113 11/01/2014 1747    Additional studies/ records that were reviewed today include:   Cardiac Catheterization, 09/17/11: Coronary Arteries:  Right dominant with no anomalies  LM: 30% distal   LAD: proximal- normal, mid-60% diffuse, distal-40% multiple discrete IM- Large vessel 30% instent restenosis D1- 40-50% multiple discrete lesions  Circumflex: Single OM/AV groove branch 95% instent restenosis  RCA: Likely previously disected during attempted PCI  with full metal jacket 90% proximal instent restonosis. Diffuse 80% instent restenosis distally extending into PDA  And large RV branch  Ventriculography: EF: 60%%, No RWMA;s  Hemodynamics:  Aortic Pressure: 100/63 mmHg LV Pressure: 100/ 16 mmHg  Impression: Discussed case with Dr Excell Seltzerooper and [place]. Best approach would be to try to fix proximal RCA and OM and then Rx medically. Distal RCA not likely  Salvageable. Admit heparin and load with Plavix.  At the end of the case a TR band was placed with good hemostasis and flow to the hand including the radial aspect of the thumb.   Cardiac Catheterization, 09/18/11: Lesion Data: Vessel: Mid RCA Percent stenosis (pre): 90% TIMI-flow (pre): 3 PCI: 3.0 mm cutting balloon Percent stenosis (post): 0% TIMI-flow (post): 3  Vessel: Mid LCx Percent stenoses (pre): 95% TIMI flow (pre): 3 PCI: 3.0 mm cutting balloon Percent stenoses (post): 0% TIMI flow (post): 3  Conclusions:  Successful PCI with cutting balloon angioplasty of the mid RCA and the mid left circumflex for in-stent restenosis.  Recommendations: Continue dual antiplatelet therapy for one year   - Stress echo (12/2008): No ischemia (target heart rate not achieved)   ASSESSMENT & PLAN:    1. Anginal pain - Chest pressure at exertion. No rest pain. New TWI in  leads V3 to V5. Suspects worsening of CAD. Will plan for cath for definite evaluation of coronary anatomy.   2. CAD - multiple PCI procedures in the past. He had several stents placed in [place] with full metal jacket in the RCA and stent to the intermediate. - LHC (8/292013) for chest pain showed:  dLM 30%, mLAD 60%, dLAD 40%, IM 30% ISR, D1 40-50%, OM/AV groove branch 95% ISR, pRCA 90% SIR, dRCA 80% ISR, EF 60%. Subsequently underwent cutting Balloon angioplasty to the mid RCA and  mid circumflex.  3. Polysubstance abuse - Advice cessation. Education given. Given use of cocaine will discontinue coreg 3.125mg  BID.  4. Sinus bradycardia - Might be leading to intermittent dizziness. Rate of 52 today. Will D/C coreg for now. Resume depending on cath.   5. HLD - 11/01/2014: Cholesterol 197; HDL 54; LDL Cholesterol 113; Triglycerides 152*; VLDL 30  - Will get lipid panel and LFT today. He is allergic to Crestor. Try another statin at next visit given hx of CAD and DM.   6. DM - per primary  Dispo: Will need to placed on better cardiac medicine pending cath.    Medication Adjustments/Labs and Tests Ordered: Current medicines are reviewed at length with the patient today.  Concerns regarding medicines are outlined above.  Medication changes, Labs and Tests ordered today are listed in the Patient Instructions below. There are no Patient Instructions on file for this visit.   Lorelei PontSigned, Matheo Rathbone, [place]  07/31/2015 9:49 AM    [facility] [address], [place], [place]  [address] Phone: [phone]) [phone]; Fax: [phone](336) [phone]

## 2015-07-31 NOTE — Patient Instructions (Addendum)
Medication Instructions:  Your physician has recommended you make the following change in your medication:  1.  STOP the Coreg .   Labwork: TODAY:  BMET, CBC W/DIFF, LIPID, & PT/INR  Testing/Procedures: Your physician has requested that you have a cardiac catheterization. Cardiac catheterization is used to diagnose and/or treat various heart conditions. Doctors may recommend this procedure for a number of different reasons. The most common reason is to evaluate chest pain. Chest pain can be a symptom of coronary artery disease (CAD), and cardiac catheterization can show whether plaque is narrowing or blocking your heart's arteries. This procedure is also used to evaluate the valves, as well as measure the blood flow and oxygen levels in different parts of your heart. For further information please visit [email or web address] Please follow instruction sheet, as given.   Follow-Up: Your physician recommends that you schedule a follow-up appointment in: [facility]   Any Other Special Instructions Will Be Listed Below (If Applicable).  Coronary Angiogram A coronary angiogram, also called coronary angiography, is an X-ray procedure used to look at the arteries in the heart. In this procedure, a dye (contrast dye) is injected through a long, hollow tube (catheter). The catheter is about the size of a piece of cooked spaghetti and is inserted through your groin, wrist, or arm. The dye is injected into each artery, and X-rays are then taken to show if there is a blockage in the arteries of your heart. LET [facility] PROVID[facility] KNOW ABOUT:  Any allergies you have, including allergies to shellfish or contrast dye.   All medicines you are taking, including vitamins, herbs, eye drops, creams, and over-the-counter medicines.   Previous problems you or members of your family have had with the use of anesthetics.   Any blood disorders you have.   Previous surgeries you have  had.  History of kidney problems or failure.   Other medical conditions you have. RISKS AND COMPLICATIONS  Generally, a coronary angiogram is a safe procedure. However, problems can occur and include:  Allergic reaction to the dye.  Bleeding from the access site or other locations.  Kidney injury, especially in people with impaired kidney function.  Stroke (rare).  Heart attack (rare). BEFORE THE PROCEDURE   Do not eat or drink anything after midnight the night before the procedure or as directed by your health care provider.   Ask your health care provider about changing or stopping your regular medicines. This is especially important if you are taking diabetes medicines or blood thinners. PROCEDURE  You may be given a medicine to help you relax (sedative) before the procedure. This medicine is given through an intravenous (IV) access tube that is inserted into one of your veins.   The area where the catheter will be inserted will be washed and shaved. This is usually done in the groin but may be done in the fold of your arm (near your elbow) or in the wrist.   A medicine will be given to numb the area where the catheter will be inserted (local anesthetic).   The health care provider will insert the catheter into an artery. The catheter will be guided by using a special type of X-ray (fluoroscopy) of the blood vessel being examined.   A special dye will then be injected into the catheter, and X-rays will be taken. The dye will help to show where any narrowing or blockages are located in the heart arteries.  AFTER THE PROCEDURE  If the procedure is done through the leg, you will be kept in bed lying flat for several hours. You will be instructed to not bend or cross your legs.  The insertion site will be checked frequently.   The pulse in your feet or wrist will be checked frequently.   Additional blood tests, X-rays, and an electrocardiogram may be done.      This information is not intended to replace advice given to you by your health care provider. Make sure you discuss any questions you have with your health care provider.   Document Released: 07/12/2002 Document Revised: 01/26/2014 Document Reviewed: 05/30/2012 Elsevier Interactive Patient Education [employer]   If you need a refill on your cardiac medications before your next appointment, please call your pharmacy.

## 2015-08-01 ENCOUNTER — Encounter (HOSPITAL_COMMUNITY)
Admission: RE | Disposition: A | Discharge status: Home / Self Care | Payer: Self-pay | Source: Ambulatory Visit | Attending: Thoracic Surgery (Cardiothoracic Vascular Surgery)

## 2015-08-01 ENCOUNTER — Other Ambulatory Visit: Payer: Self-pay | Admitting: *Deleted

## 2015-08-01 ENCOUNTER — Inpatient Hospital Stay (HOSPITAL_COMMUNITY): Payer: Self-pay

## 2015-08-01 ENCOUNTER — Inpatient Hospital Stay (HOSPITAL_COMMUNITY)
Admission: RE | Admit: 2015-08-01 | Discharge: 2015-08-10 | DRG: 234 | Disposition: A | Discharge status: Home / Self Care | Payer: Self-pay | Source: Ambulatory Visit | Attending: Thoracic Surgery (Cardiothoracic Vascular Surgery) | Admitting: Thoracic Surgery (Cardiothoracic Vascular Surgery)

## 2015-08-01 ENCOUNTER — Encounter (HOSPITAL_COMMUNITY): Payer: Self-pay | Admitting: Cardiology

## 2015-08-01 DIAGNOSIS — J45909 Unspecified asthma, uncomplicated: Secondary | ICD-10-CM | POA: Diagnosis present

## 2015-08-01 DIAGNOSIS — I2510 Atherosclerotic heart disease of native coronary artery without angina pectoris: Secondary | ICD-10-CM

## 2015-08-01 DIAGNOSIS — Z8249 Family history of ischemic heart disease and other diseases of the circulatory system: Secondary | ICD-10-CM

## 2015-08-01 DIAGNOSIS — T82855A Stenosis of coronary artery stent, initial encounter: Secondary | ICD-10-CM | POA: Diagnosis present

## 2015-08-01 DIAGNOSIS — F1410 Cocaine abuse, uncomplicated: Secondary | ICD-10-CM | POA: Diagnosis present

## 2015-08-01 DIAGNOSIS — I25118 Atherosclerotic heart disease of native coronary artery with other forms of angina pectoris: Secondary | ICD-10-CM

## 2015-08-01 DIAGNOSIS — R001 Bradycardia, unspecified: Secondary | ICD-10-CM | POA: Diagnosis present

## 2015-08-01 DIAGNOSIS — Z888 Allergy status to other drugs, medicaments and biological substances status: Secondary | ICD-10-CM

## 2015-08-01 DIAGNOSIS — G8929 Other chronic pain: Secondary | ICD-10-CM | POA: Diagnosis present

## 2015-08-01 DIAGNOSIS — Z87891 Personal history of nicotine dependence: Secondary | ICD-10-CM

## 2015-08-01 DIAGNOSIS — R079 Chest pain, unspecified: Secondary | ICD-10-CM

## 2015-08-01 DIAGNOSIS — D62 Acute posthemorrhagic anemia: Secondary | ICD-10-CM | POA: Diagnosis not present

## 2015-08-01 DIAGNOSIS — E1142 Type 2 diabetes mellitus with diabetic polyneuropathy: Secondary | ICD-10-CM | POA: Diagnosis present

## 2015-08-01 DIAGNOSIS — I25110 Atherosclerotic heart disease of native coronary artery with unstable angina pectoris: Principal | ICD-10-CM | POA: Diagnosis present

## 2015-08-01 DIAGNOSIS — E7800 Pure hypercholesterolemia, unspecified: Secondary | ICD-10-CM | POA: Diagnosis present

## 2015-08-01 DIAGNOSIS — I252 Old myocardial infarction: Secondary | ICD-10-CM

## 2015-08-01 DIAGNOSIS — Z951 Presence of aortocoronary bypass graft: Secondary | ICD-10-CM

## 2015-08-01 DIAGNOSIS — Y831 Surgical operation with implant of artificial internal device as the cause of abnormal reaction of the patient, or of later complication, without mention of misadventure at the time of the procedure: Secondary | ICD-10-CM | POA: Diagnosis present

## 2015-08-01 DIAGNOSIS — K668 Other specified disorders of peritoneum: Secondary | ICD-10-CM

## 2015-08-01 DIAGNOSIS — E782 Mixed hyperlipidemia: Secondary | ICD-10-CM | POA: Diagnosis present

## 2015-08-01 DIAGNOSIS — E1151 Type 2 diabetes mellitus with diabetic peripheral angiopathy without gangrene: Secondary | ICD-10-CM | POA: Diagnosis present

## 2015-08-01 DIAGNOSIS — E785 Hyperlipidemia, unspecified: Secondary | ICD-10-CM | POA: Diagnosis present

## 2015-08-01 DIAGNOSIS — R51 Headache: Secondary | ICD-10-CM | POA: Diagnosis present

## 2015-08-01 DIAGNOSIS — Z794 Long term (current) use of insulin: Secondary | ICD-10-CM

## 2015-08-01 DIAGNOSIS — I25119 Atherosclerotic heart disease of native coronary artery with unspecified angina pectoris: Secondary | ICD-10-CM | POA: Diagnosis present

## 2015-08-01 DIAGNOSIS — I200 Unstable angina: Secondary | ICD-10-CM | POA: Diagnosis present

## 2015-08-01 DIAGNOSIS — Z7982 Long term (current) use of aspirin: Secondary | ICD-10-CM

## 2015-08-01 DIAGNOSIS — F1210 Cannabis abuse, uncomplicated: Secondary | ICD-10-CM | POA: Diagnosis present

## 2015-08-01 DIAGNOSIS — K219 Gastro-esophageal reflux disease without esophagitis: Secondary | ICD-10-CM | POA: Diagnosis present

## 2015-08-01 DIAGNOSIS — E119 Type 2 diabetes mellitus without complications: Secondary | ICD-10-CM

## 2015-08-01 DIAGNOSIS — M109 Gout, unspecified: Secondary | ICD-10-CM | POA: Diagnosis present

## 2015-08-01 DIAGNOSIS — Z955 Presence of coronary angioplasty implant and graft: Secondary | ICD-10-CM

## 2015-08-01 DIAGNOSIS — M545 Low back pain: Secondary | ICD-10-CM | POA: Diagnosis present

## 2015-08-01 DIAGNOSIS — I10 Essential (primary) hypertension: Secondary | ICD-10-CM | POA: Diagnosis present

## 2015-08-01 DIAGNOSIS — I319 Disease of pericardium, unspecified: Secondary | ICD-10-CM | POA: Diagnosis present

## 2015-08-01 DIAGNOSIS — E8770 Fluid overload, unspecified: Secondary | ICD-10-CM | POA: Diagnosis present

## 2015-08-01 DIAGNOSIS — N281 Cyst of kidney, acquired: Secondary | ICD-10-CM

## 2015-08-01 HISTORY — DX: Unspecified asthma, uncomplicated: J45.909

## 2015-08-01 HISTORY — DX: Calculus of kidney: N20.0

## 2015-08-01 HISTORY — PX: CARDIAC CATHETERIZATION: SHX172

## 2015-08-01 LAB — GLUCOSE, CAPILLARY
Glucose-Capillary: 111 mg/dL — ABNORMAL HIGH (ref 65–99)
Glucose-Capillary: 104 mg/dL — ABNORMAL HIGH (ref 65–99)
Glucose-Capillary: 151 mg/dL — ABNORMAL HIGH (ref 65–99)
Glucose-Capillary: 108 mg/dL — ABNORMAL HIGH (ref 65–99)
Glucose-Capillary: 129 mg/dL — ABNORMAL HIGH (ref 65–99)

## 2015-08-01 LAB — HEPARIN LEVEL (UNFRACTIONATED): HEPARIN UNFRACTIONATED: 0.15 [IU]/mL — AB (ref 0.30–0.70)

## 2015-08-01 LAB — ECHOCARDIOGRAM COMPLETE
HEIGHTINCHES: 71 in
WEIGHTICAEL: 2560 [oz_av]

## 2015-08-01 SURGERY — LEFT HEART CATH AND CORONARY ANGIOGRAPHY

## 2015-08-01 MED ORDER — FENTANYL CITRATE (PF) 100 MCG/2ML IJ SOLN
INTRAMUSCULAR | Status: DC | PRN
  Administered 2015-08-01: 25 ug via INTRAVENOUS

## 2015-08-01 MED ORDER — SODIUM CHLORIDE 0.9% FLUSH: 3.0000 mL | INTRAVENOUS | Status: DC | PRN

## 2015-08-01 MED ORDER — SODIUM CHLORIDE 0.9 % IV SOLN
INTRAVENOUS | Status: DC
  Administered 2015-08-01: 07:00:00 via INTRAVENOUS

## 2015-08-01 MED ORDER — NITROGLYCERIN IN D5W 200-5 MCG/ML-% IV SOLN
0.0000 ug/min | INTRAVENOUS | Status: DC
  Administered 2015-08-01: 5 ug/min via INTRAVENOUS
  Filled 2015-08-01 (×2): qty 250

## 2015-08-01 MED ORDER — SODIUM CHLORIDE 0.9% FLUSH: 3.0000 mL | Freq: Two times a day (BID) | INTRAVENOUS | Status: DC

## 2015-08-01 MED ORDER — IOPAMIDOL (ISOVUE-370) INJECTION 76%
INTRAVENOUS | Status: AC
  Filled 2015-08-01: qty 100

## 2015-08-01 MED ORDER — ACETAMINOPHEN 325 MG PO TABS
650.0000 mg | ORAL_TABLET | ORAL | Status: DC | PRN
  Administered 2015-08-01 – 2015-08-03 (×4): 650 mg via ORAL
  Filled 2015-08-01 (×4): qty 2

## 2015-08-01 MED ORDER — ONDANSETRON HCL 4 MG/2ML IJ SOLN: 4.0000 mg | Freq: Four times a day (QID) | INTRAMUSCULAR | Status: DC | PRN

## 2015-08-01 MED ORDER — SODIUM CHLORIDE 0.9 % IV SOLN: 250.0000 mL | INTRAVENOUS | Status: DC | PRN

## 2015-08-01 MED ORDER — ASPIRIN 81 MG PO CHEW: 81.0000 mg | CHEWABLE_TABLET | ORAL | Status: DC

## 2015-08-01 MED ORDER — ASPIRIN EC 81 MG PO TBEC
81.0000 mg | DELAYED_RELEASE_TABLET | Freq: Every day | ORAL | Status: DC
  Administered 2015-08-02 – 2015-08-04 (×3): 81 mg via ORAL
  Filled 2015-08-01 (×3): qty 1

## 2015-08-01 MED ORDER — HEPARIN SODIUM (PORCINE) 1000 UNIT/ML IJ SOLN
INTRAMUSCULAR | Status: AC
  Filled 2015-08-01: qty 1

## 2015-08-01 MED ORDER — OMEGA-3-ACID ETHYL ESTERS 1 G PO CAPS
1.0000 g | ORAL_CAPSULE | Freq: Two times a day (BID) | ORAL | Status: DC
  Administered 2015-08-01 – 2015-08-04 (×8): 1 g via ORAL
  Filled 2015-08-01 (×8): qty 1

## 2015-08-01 MED ORDER — LIDOCAINE HCL (PF) 1 % IJ SOLN
INTRAMUSCULAR | Status: AC
  Filled 2015-08-01: qty 30

## 2015-08-01 MED ORDER — VERAPAMIL HCL 2.5 MG/ML IV SOLN
INTRAVENOUS | Status: AC
  Filled 2015-08-01: qty 2

## 2015-08-01 MED ORDER — HEPARIN (PORCINE) IN NACL 2-0.9 UNIT/ML-% IJ SOLN
INTRAMUSCULAR | Status: DC | PRN
  Administered 2015-08-01: 1000 mL

## 2015-08-01 MED ORDER — MIDAZOLAM HCL 2 MG/2ML IJ SOLN
INTRAMUSCULAR | Status: DC | PRN
  Administered 2015-08-01: 2 mg via INTRAVENOUS

## 2015-08-01 MED ORDER — NITROGLYCERIN 0.4 MG SL SUBL: 0.4000 mg | SUBLINGUAL_TABLET | SUBLINGUAL | Status: DC | PRN

## 2015-08-01 MED ORDER — MORPHINE SULFATE (PF) 2 MG/ML IV SOLN: 2.0000 mg | INTRAVENOUS | Status: DC | PRN

## 2015-08-01 MED ORDER — SODIUM CHLORIDE 0.9 % WEIGHT BASED INFUSION
3.0000 mL/kg/h | INTRAVENOUS | Status: AC
Start: 2015-08-01 — End: 2015-08-01
  Administered 2015-08-01: 3 mL/kg/h via INTRAVENOUS

## 2015-08-01 MED ORDER — PNEUMOCOCCAL VAC POLYVALENT 25 MCG/0.5ML IJ INJ
0.5000 mL | INJECTION | INTRAMUSCULAR | Status: AC
  Administered 2015-08-02: 0.5 mL via INTRAMUSCULAR
  Filled 2015-08-01: qty 0.5

## 2015-08-01 MED ORDER — LIDOCAINE HCL (PF) 1 % IJ SOLN
INTRAMUSCULAR | Status: DC | PRN
  Administered 2015-08-01: 2 mL via INTRADERMAL

## 2015-08-01 MED ORDER — VERAPAMIL HCL 2.5 MG/ML IV SOLN
INTRAVENOUS | Status: DC | PRN
  Administered 2015-08-01: 10 mL via INTRA_ARTERIAL

## 2015-08-01 MED ORDER — FENTANYL CITRATE (PF) 100 MCG/2ML IJ SOLN
INTRAMUSCULAR | Status: AC
  Filled 2015-08-01: qty 2

## 2015-08-01 MED ORDER — HEPARIN (PORCINE) IN NACL 100-0.45 UNIT/ML-% IJ SOLN
1100.0000 [IU]/h | INTRAMUSCULAR | Status: DC
  Administered 2015-08-01: 850 [IU]/h via INTRAVENOUS
  Administered 2015-08-02 – 2015-08-04 (×3): 1100 [IU]/h via INTRAVENOUS
  Filled 2015-08-01 (×4): qty 250

## 2015-08-01 MED ORDER — HEPARIN SODIUM (PORCINE) 1000 UNIT/ML IJ SOLN
INTRAMUSCULAR | Status: DC | PRN
  Administered 2015-08-01: 3500 [IU] via INTRAVENOUS

## 2015-08-01 MED ORDER — HEPARIN (PORCINE) IN NACL 2-0.9 UNIT/ML-% IJ SOLN
INTRAMUSCULAR | Status: AC
  Filled 2015-08-01: qty 1000

## 2015-08-01 MED ORDER — ATORVASTATIN CALCIUM 80 MG PO TABS
80.0000 mg | ORAL_TABLET | Freq: Every day | ORAL | Status: DC
  Administered 2015-08-01 – 2015-08-09 (×8): 80 mg via ORAL
  Filled 2015-08-01 (×8): qty 1

## 2015-08-01 MED ORDER — IOPAMIDOL (ISOVUE-370) INJECTION 76%
INTRAVENOUS | Status: DC | PRN
  Administered 2015-08-01: 70 mL via INTRA_ARTERIAL

## 2015-08-01 MED ORDER — SODIUM CHLORIDE 0.9% FLUSH
3.0000 mL | Freq: Two times a day (BID) | INTRAVENOUS | Status: DC
  Administered 2015-08-01 – 2015-08-04 (×6): 3 mL via INTRAVENOUS

## 2015-08-01 MED ORDER — MIDAZOLAM HCL 2 MG/2ML IJ SOLN
INTRAMUSCULAR | Status: AC
  Filled 2015-08-01: qty 2

## 2015-08-01 MED ORDER — ALLOPURINOL 100 MG PO TABS
100.0000 mg | ORAL_TABLET | Freq: Every day | ORAL | Status: DC
  Administered 2015-08-02 – 2015-08-10 (×8): 100 mg via ORAL
  Filled 2015-08-01 (×9): qty 1

## 2015-08-01 SURGICAL SUPPLY — 11 items
CATH INFINITI 5 FR JL3.5 (CATHETERS) ×2 IMPLANT
CATH INFINITI 5FR ANG PIGTAIL (CATHETERS) ×2 IMPLANT
CATH INFINITI JR4 5F (CATHETERS) ×2 IMPLANT
DEVICE RAD COMP TR BAND LRG (VASCULAR PRODUCTS) ×2 IMPLANT
GLIDESHEATH SLEND SS 6F .021 (SHEATH) ×2 IMPLANT
KIT HEART LEFT (KITS) ×3 IMPLANT
PACK CARDIAC CATHETERIZATION (CUSTOM PROCEDURE TRAY) ×3 IMPLANT
SYR MEDRAD MARK V 150ML (SYRINGE) ×3 IMPLANT
TRANSDUCER W/STOPCOCK (MISCELLANEOUS) ×3 IMPLANT
TUBING CIL FLEX 10 FLL-RA (TUBING) ×3 IMPLANT
WIRE SAFE-T 1.5MM-J .035X260CM (WIRE) ×2 IMPLANT

## 2015-08-01 NOTE — Progress Notes (Signed)
ANTICOAGULATION CONSULT NOTE Pharmacy Consult for Heparin  Indication:  3V CAD, for TCTS consult re: CABG  Allergies  Allergen Reactions  . Crestor [Rosuvastatin] Other (See Comments)    "Makes my legs hurt" - tolerates simvastatin     Patient Measurements: Height: 5\' 11"  (180.3 cm) Weight: 160 lb (72.576 kg) IBW/kg (Calculated) : 75.3 Heparin Dosing Weight: N/A - TBW < IBW  Vital Signs: Temp: 98.7 F (37.1 C) (07/13 1425) Temp Source: Oral (07/13 1425) BP: 111/79 mmHg (07/13 1730)  Labs:  Recent Labs  07/31/15 1051 08/01/15 2126  HGB 12.7*  --   HCT 38.1*  --   PLT 251  --   LABPROT 10.2  --   INR 1.0  --   HEPARINUNFRC  --  0.15*  CREATININE 1.65*  --     Estimated Creatinine Clearance: 53.8 mL/min (by C-G formula based on Cr of 1.65).   Medical History: Past Medical History  Diagnosis Date  . ELECTROCARDIOGRAM, ABNORMAL   . Coronary artery disease   . Anginal pain (HCC)   . Hypertension   . GERD (gastroesophageal reflux disease)   . High cholesterol   . Peripheral vascular disease (HCC)     LLE  . Pneumonia 1990's  . Shortness of breath 09/18/11    "@ rest; lying down; w/exertion"  . Lower GI bleed 09/18/2011    "clots and everything; not lately"  . Headache(784.0) 09/18/2011    "maybe twice/wk; pressure on the brain"  . Chronic lower back pain   . Gout   . Anxiety   . Kidney stones   . Myocardial infarction (HCC) 10/2005  . Childhood asthma   . Type I diabetes mellitus (HCC)   . Seizures (HCC) 09/18/2011    "blanks out on me"/wife's report    Medications:  Scheduled:  . [START ON 08/02/2015] allopurinol  100 mg Oral Daily  . [START ON 08/02/2015] aspirin EC  81 mg Oral Daily  . atorvastatin  80 mg Oral q1800  . omega-3 acid ethyl esters  1 g Oral BID  . [START ON 08/02/2015] pneumococcal 23 valent vaccine  0.5 mL Intramuscular Tomorrow-1000  . sodium chloride flush  3 mL Intravenous Q12H   Infusions:  . heparin 850 Units/hr (08/01/15 1630)   . nitroGLYCERIN 5 mcg/min (08/01/15 1102)   PRN: sodium chloride, acetaminophen, morphine injection, nitroGLYCERIN, ondansetron (ZOFRAN) IV, sodium chloride flush  Assessment: 50 y/o male presented with chest pain 7/12. Past medical history significant for CAD s/p multiple PCI procedures, HTN, DM, HLD, and hx of GI bleed in 2013. Pharmacy was consulted to dose post-cath heparin 8 hours after sheath removed. No problems/bleeding noted in procedure details or by nurse.  H/H appear stable and platelets within normal limits.   Initial heparin level low at 0.15   Goal of Therapy Heparin level 0.3-0.7 units/ml Monitor platelets by anticoagulation protocol: Yes   Plan:  - Heparin to 1100 units / hr - 8 hour heparin level  - CBC daily to monitor H/H and platelets while on heparin   Thank you Okey RegalLisa Denisha Hoel, PharmD [phone]  08/01/2015,10:19 PM

## 2015-08-01 NOTE — Interval H&P Note (Signed)
History and Physical Interval Note:  08/01/2015 7:39 AM  Willie Walker Covertorman  has presented today for surgery, with the diagnosis of angina  The various methods of treatment have been discussed with the patient and family. After consideration of risks, benefits and other options for treatment, the patient has consented to  Procedure(s): Left Heart Cath and Coronary Angiography (N/A) as a surgical intervention .  The patient's history has been reviewed, patient examined, no change in status, stable for surgery.  I have reviewed the patient's chart and labs.  Questions were answered to the patient's satisfaction.    Cath Lab Visit (complete for each Cath Lab visit)  Clinical Evaluation Leading to the Procedure:   ACS: Yes.    Non-ACS:    Anginal Classification: CCS IV  Anti-ischemic medical therapy: Minimal Therapy (1 class of medications)  Non-Invasive Test Results: No non-invasive testing performed  Prior CABG: No previous CABG       Theron Aristaeter [facility] Eye CenterJordanMD,FACC 08/01/2015 7:40 AM

## 2015-08-01 NOTE — Progress Notes (Signed)
ANTICOAGULATION CONSULT NOTE - Initial Consult  Pharmacy Consult for Heparin  Indication:  3V CAD, for TCTS consult re: CABG  Allergies  Allergen Reactions  . Crestor [Rosuvastatin] Other (See Comments)    "Makes my legs hurt" - tolerates simvastatin     Patient Measurements: Height: 5\' 11"  (180.3 cm) Weight: 160 lb (72.576 kg) IBW/kg (Calculated) : 75.3 Heparin Dosing Weight: N/A - TBW < IBW  Vital Signs: Temp: 97.4 F (36.3 C) (07/13 0843) Temp Source: Oral (07/13 0843) BP: 133/90 mmHg (07/13 1030) Pulse Rate: 53 (07/13 0854)  Labs:  Recent Labs  07/31/15 1051  HGB 12.7*  HCT 38.1*  PLT 251  LABPROT 10.2  INR 1.0  CREATININE 1.65*    Estimated Creatinine Clearance: 53.8 mL/min (by C-G formula based on Cr of 1.65).   Medical History: Past Medical History  Diagnosis Date  . ELECTROCARDIOGRAM, ABNORMAL   . Coronary artery disease   . Anginal pain (HCC)   . Hypertension   . GERD (gastroesophageal reflux disease)   . High cholesterol   . Peripheral vascular disease (HCC)     LLE  . Myocardial infarction (HCC) 10/2005  . Asthma     "had it; outgrew it"  . Pneumonia 1990's  . Shortness of breath 09/18/11    "@ rest; lying down; w/exertion"  . Type I diabetes mellitus (HCC)   . Lower GI bleed 09/18/2011    "clots and everything; not lately"  . Headache(784.0) 09/18/2011    "maybe twice/wk; pressure on the brain"  . Seizures (HCC) 09/18/2011    "blanks out on me"/wife's report  . Chronic lower back pain   . Gout   . Anxiety     Medications:  Scheduled:  . [START ON 08/02/2015] allopurinol  100 mg Oral Daily  . [START ON 08/02/2015] aspirin EC  81 mg Oral Daily  . atorvastatin  80 mg Oral q1800  . omega-3 acid ethyl esters  1 g Oral BID  . [START ON 08/02/2015] pneumococcal 23 valent vaccine  0.5 mL Intramuscular Tomorrow-1000  . sodium chloride flush  3 mL Intravenous Q12H   Infusions:  . sodium chloride 3 mL/kg/hr (08/01/15 0841)  . nitroGLYCERIN      PRN: sodium chloride, acetaminophen, morphine injection, nitroGLYCERIN, ondansetron (ZOFRAN) IV, sodium chloride flush  Assessment: 50 y/o male presented with chest pain 7/12. Past medical history significant for CAD s/p multiple PCI procedures, HTN, DM, HLD, and hx of GI bleed in 2013. Pharmacy was consulted to dose post-cath heparin 8 hours after sheath removed. No problems/bleeding noted in procedure details or by nurse.  H/H appear stable and platelets within normal limits.    Goal of Therapy Heparin level 0.3-0.7 units/ml Monitor platelets by anticoagulation protocol: Yes   Plan:  - Start heparin infusion at 850 units/hour at 1600  - Check anti-Xa level in 6 hours and daily while on heparin  - CBC daily to monitor H/H and platelets while on heparin   Mahala MenghiniMargaret E Shuda, PharmD PGY1 Pharmacy Resident  Pager: [phone] 08/01/2015,10:50 AM  I discussed / reviewed the pharmacy note by Dr. Lavonna RuaShuda and I agree with the resident's findings and plans as documented.  Dennie Fettersgan, Trentin Knappenberger Donovan, [place] Pager: [phone] 08/01/2015 11:30 AM

## 2015-08-01 NOTE — H&P (View-Only) (Signed)
Cardiology Office Note    Date:  07/31/2015   ID:  OZZY BOHLKEN, DOB 1965/04/08, MRN [id number]  PCP:  Jeanann Lewandowsky, MD  Cardiologist:  Dr. Eden Emms  Chief Complaint: Chest pain  History of Present Illness:   Willie Walker is a 50 y.o. male with hx of CAD, HTN, HL, HLD, PVD and ongoing polysubstance abuse who presented for evaluation of chest pain.   History of CAD and multiple PCI procedures in the past. He had several stents placed in [place] with full metal jacket in the RCA and stent to the intermediate.  LHC (8/292013) for chest pain showed:  dLM 30%, mLAD 60%, dLAD 40%, IM 30% ISR, D1 40-50%, OM/AV groove branch 95% ISR, pRCA 90% SIR, dRCA 80% ISR, EF 60%. Subsequently underwent cutting Balloon angioplasty to the mid RCA; Cutting Balloon angioplasty to the mid circumflex. Last seen by Dr. Eden Emms 12/2013. Per note patient patient was enrolled in STATUSFIRST trial 08/29/14.   He was added to [facility] today for chest pain. Substernal chest pressure with exertion for the past 3-5 months. Associated with SOB and diaphoresis. Intermittent radiation to L arm. No nausea. No rest pain. Complains of intermittent dizziness. He states that he hasn't smoker tabacco for the past 5 years however smokes marijuana 3-4 times/day and smokes cocaine 2 times/month. Last use 4th of July. Hasn't took coreg in past 6 weeks, states he runs out of this. Denies fever, chills, palpitations, orthopnea, PND, syncope, LE edema.   Past Medical History  Diagnosis Date  . ELECTROCARDIOGRAM, ABNORMAL   . Coronary artery disease   . Anginal pain (HCC)   . Hypertension   . GERD (gastroesophageal reflux disease)   . High cholesterol   . Peripheral vascular disease (HCC)     LLE  . Myocardial infarction (HCC) 10/2005  . Asthma     "had it; outgrew it"  . Pneumonia 1990's  . Shortness of breath 09/18/11    "@ rest; lying down; w/exertion"  . Type I diabetes mellitus (HCC)   . Lower GI bleed  09/18/2011    "clots and everything; not lately"  . Headache(784.0) 09/18/2011    "maybe twice/wk; pressure on the brain"  . Seizures (HCC) 09/18/2011    "blanks out on me"/wife's report  . Chronic lower back pain   . Gout   . Anxiety     Past Surgical History  Procedure Laterality Date  . Coronary angioplasty with stent placement  10/2005    "9"  . Coronary angioplasty  09/18/2011  . Ines Bloomer hole of cranium  1999    "mugged"  . Laceration repair  1999    BLE "muggine"  . Percutaneous coronary stent intervention (pci-s) N/A 09/18/2011    Procedure: PERCUTANEOUS CORONARY STENT INTERVENTION (PCI-S);  Surgeon: Peter M Swaziland, MD;  Location: [facility] C[facility] LAB;  Service: Cardiovascular;  Laterality: N/A;    Current Medications: Prior to Admission medications   Medication Sig Start Date End Date Taking? Authorizing Provider  allopurinol (ZYLOPRIM) 100 MG tablet Take 1 tablet (100 mg total) by mouth daily. 11/01/14  Yes Quentin Angst, MD  aspirin EC 81 MG tablet Take 1 tablet (81 mg total) by mouth daily. 02/09/13  Yes Richarda Overlie, MD  carvedilol (COREG) 3.125 MG tablet Take 1 tablet (3.125 mg total) by mouth 2 (two) times daily with a meal. 07/25/15  Yes Olugbemiga E Hyman Hopes, MD  colchicine 0.6 MG tablet Take 1 tablet (0.6 mg total) by mouth daily. 11/01/14  Yes Quentin Angst, MD  fish oil-omega-3 fatty acids 1000 MG capsule Take 1 g by mouth 2 (two) times daily.   Yes Historical Provider, MD  lisinopril (PRINIVIL,ZESTRIL) 10 MG tablet Take 1 tablet (10 mg total) by mouth daily. 11/01/14  Yes Quentin Angst, MD  metFORMIN (GLUCOPHAGE) 500 MG tablet Take 1 tablet (500 mg total) by mouth 2 (two) times daily with a meal. Patient taking differently: Take 500 mg by mouth daily with breakfast.  11/01/14  Yes Quentin Angst, MD  nitroGLYCERIN (NITROSTAT) 0.4 MG SL tablet Place 1 tablet (0.4 mg total) under the tongue every 5 (five) minutes x 3 doses as needed for chest pain. 12/21/13  10/19/15 Yes Wendall Stade, MD    Allergies:   Crestor   Social History   Social History  . Marital Status: Divorced    Spouse Name: N/A  . Number of Children: N/A  . Years of Education: N/A   Social History Main Topics  . Smoking status: Former Smoker -- 0.50 packs/day for 20 years    Types: Cigarettes    Quit date: 11/02/2005  . Smokeless tobacco: Never Used  . Alcohol Use: No     Comment: 09/18/2011 "twice a month; 6 shots at a time"  . Drug Use: Yes    Special: Cocaine, Marijuana, "Crack" cocaine     Comment: 09/18/2011 quit "06/11/2003"  . Sexual Activity: Yes   Other Topics Concern  . None   Social History Narrative     Family History:  The patient's family history includes CVA in his father; Dementia in his mother; Heart attack in his maternal grandmother; Hypertension in his mother.   ROS:   Please see the history of present illness.    ROS All other systems reviewed and are negative.   PHYSICAL EXAM:   VS:  Ht  (1.803 m)  Wt 159 lb (72.122 kg)  BMI 22.19 kg/m2   GEN: Well nourished, well developed, in no acute distress HEENT: normal Neck: no JVD, carotid bruits, or masses Cardiac: RRR; no murmurs, rubs, or gallops,no edema  Respiratory:  clear to auscultation bilaterally, normal work of breathing GI: soft, nontender, nondistended, + BS MS: no deformity or atrophy Skin: warm and dry, no rash Neuro:  Alert and Oriented x 3, Strength and sensation are intact Psych: euthymic mood, full affect  Wt Readings from Last 3 Encounters:  07/31/15 159 lb (72.122 kg)  11/19/14 159 lb (72.122 kg)  11/01/14 160 lb (72.576 kg)      Studies/Labs Reviewed:   EKG:  EKG is ordered today.  The ekg ordered today demonstrates sinus bradycardia at rate of 52 bpm and new TWI in leads V3 to V5.   Recent Labs: 11/01/2014: Hemoglobin 12.7*; Platelets 311 07/25/2015: ALT 13; BUN 25; Creat 1.34*; Potassium 4.7; Sodium 138   Lipid Panel    Component Value Date/Time    CHOL 197 11/01/2014 1747   TRIG 152* 11/01/2014 1747   HDL 54 11/01/2014 1747   CHOLHDL 3.6 11/01/2014 1747   VLDL 30 11/01/2014 1747   LDLCALC 113 11/01/2014 1747    Additional studies/ records that were reviewed today include:   Cardiac Catheterization, 09/17/11: Coronary Arteries:  Right dominant with no anomalies  LM: 30% distal   LAD: proximal- normal, mid-60% diffuse, distal-40% multiple discrete IM- Large vessel 30% instent restenosis D1- 40-50% multiple discrete lesions  Circumflex: Single OM/AV groove branch 95% instent restenosis  RCA: Likely previously disected during attempted PCI  with full metal jacket 90% proximal instent restonosis. Diffuse 80% instent restenosis distally extending into PDA  And large RV branch  Ventriculography: EF: 60%%, No RWMA;s  Hemodynamics:  Aortic Pressure: 100/63 mmHg LV Pressure: 100/ 16 mmHg  Impression: Discussed case with Dr Excell Seltzerooper and [place]. Best approach would be to try to fix proximal RCA and OM and then Rx medically. Distal RCA not likely  Salvageable. Admit heparin and load with Plavix.  At the end of the case a TR band was placed with good hemostasis and flow to the hand including the radial aspect of the thumb.   Cardiac Catheterization, 09/18/11: Lesion Data: Vessel: Mid RCA Percent stenosis (pre): 90% TIMI-flow (pre): 3 PCI: 3.0 mm cutting balloon Percent stenosis (post): 0% TIMI-flow (post): 3  Vessel: Mid LCx Percent stenoses (pre): 95% TIMI flow (pre): 3 PCI: 3.0 mm cutting balloon Percent stenoses (post): 0% TIMI flow (post): 3  Conclusions:  Successful PCI with cutting balloon angioplasty of the mid RCA and the mid left circumflex for in-stent restenosis.  Recommendations: Continue dual antiplatelet therapy for one year   - Stress echo (12/2008): No ischemia (target heart rate not achieved)   ASSESSMENT & PLAN:    1. Anginal pain - Chest pressure at exertion. No rest pain. New TWI in  leads V3 to V5. Suspects worsening of CAD. Will plan for cath for definite evaluation of coronary anatomy.   2. CAD - multiple PCI procedures in the past. He had several stents placed in [place] with full metal jacket in the RCA and stent to the intermediate. - LHC (8/292013) for chest pain showed:  dLM 30%, mLAD 60%, dLAD 40%, IM 30% ISR, D1 40-50%, OM/AV groove branch 95% ISR, pRCA 90% SIR, dRCA 80% ISR, EF 60%. Subsequently underwent cutting Balloon angioplasty to the mid RCA and  mid circumflex.  3. Polysubstance abuse - Advice cessation. Education given. Given use of cocaine will discontinue coreg 3.125mg  BID.  4. Sinus bradycardia - Might be leading to intermittent dizziness. Rate of 52 today. Will D/C coreg for now. Resume depending on cath.   5. HLD - 11/01/2014: Cholesterol 197; HDL 54; LDL Cholesterol 113; Triglycerides 152*; VLDL 30  - Will get lipid panel and LFT today. He is allergic to Crestor. Try another statin at next visit given hx of CAD and DM.   6. DM - per primary  Dispo: Will need to placed on better cardiac medicine pending cath.    Medication Adjustments/Labs and Tests Ordered: Current medicines are reviewed at length with the patient today.  Concerns regarding medicines are outlined above.  Medication changes, Labs and Tests ordered today are listed in the Patient Instructions below. There are no Patient Instructions on file for this visit.   Lorelei PontSigned, Montoya Brandel, [place]PA  07/31/2015 9:49 AM    [facility] [address], [place], [place]  [address] Phone: [phone]) [phone]; Fax: [phone]) [phone]

## 2015-08-01 NOTE — Progress Notes (Signed)
Echocardiogram 2D Echocardiogram has been performed.  Willie Walker 08/01/2015, 4:19 PM

## 2015-08-02 ENCOUNTER — Inpatient Hospital Stay (HOSPITAL_COMMUNITY): Payer: Self-pay

## 2015-08-02 DIAGNOSIS — E119 Type 2 diabetes mellitus without complications: Secondary | ICD-10-CM

## 2015-08-02 DIAGNOSIS — I2510 Atherosclerotic heart disease of native coronary artery without angina pectoris: Secondary | ICD-10-CM

## 2015-08-02 DIAGNOSIS — I25110 Atherosclerotic heart disease of native coronary artery with unstable angina pectoris: Secondary | ICD-10-CM

## 2015-08-02 DIAGNOSIS — I200 Unstable angina: Secondary | ICD-10-CM

## 2015-08-02 LAB — PULMONARY FUNCTION TEST
FEF 25-75 Post: 3.23 L/sec
FEF 25-75 Pre: 2.23 L/sec
FEF2575-%CHANGE-POST: 44 %
FEF2575-%Pred-Post: 97 %
FEF2575-%Pred-Pre: 67 %
FEV1-%CHANGE-POST: 9 %
FEV1-%PRED-PRE: 89 %
FEV1-%Pred-Post: 97 %
FEV1-PRE: 3.05 L
FEV1-Post: 3.32 L
FEV1FVC-%Change-Post: 4 %
FEV1FVC-%Pred-Pre: 91 %
FEV6-%CHANGE-POST: 4 %
FEV6-%PRED-POST: 100 %
FEV6-%PRED-PRE: 96 %
FEV6-POST: 4.22 L
FEV6-PRE: 4.02 L
FEV6FVC-%Change-Post: 1 %
FEV6FVC-%PRED-POST: 101 %
FEV6FVC-%PRED-PRE: 99 %
FVC-%CHANGE-POST: 3 %
FVC-%PRED-POST: 100 %
FVC-%PRED-PRE: 96 %
FVC-POST: 4.35 L
FVC-Pre: 4.18 L
POST FEV1/FVC RATIO: 76 %
POST FEV6/FVC RATIO: 98 %
Pre FEV1/FVC ratio: 73 %
Pre FEV6/FVC Ratio: 97 %

## 2015-08-02 LAB — BASIC METABOLIC PANEL
Anion gap: 10 (ref 5–15)
BUN: 20 mg/dL (ref 6–20)
CO2: 24 mmol/L (ref 22–32)
Calcium: 9.1 mg/dL (ref 8.9–10.3)
Chloride: 103 mmol/L (ref 101–111)
Creatinine, Ser: 1.35 mg/dL — ABNORMAL HIGH (ref 0.61–1.24)
GFR calc Af Amer: >60 mL/min (ref >60)
GFR, EST NON AFRICAN AMERICAN: 59 mL/min — AB (ref >60)
GLUCOSE: 87 mg/dL (ref 65–99)
POTASSIUM: 4.2 mmol/L (ref 3.5–5.1)
Sodium: 137 mmol/L (ref 135–145)

## 2015-08-02 LAB — VAS US DOPPLER PRE CABG
LCCADSYS: -91 cm/s
LCCAPDIAS: 22 cm/s
LCCAPSYS: 123 cm/s
LEFT ECA DIAS: -15 cm/s
LEFT VERTEBRAL DIAS: 15 cm/s
LICADDIAS: -27 cm/s
LICAPDIAS: -20 cm/s
LICAPSYS: -90 cm/s
Left CCA dist dias: -21 cm/s
Left ICA dist sys: -69 cm/s
RCCAPDIAS: 17 cm/s
RCCAPSYS: 81 cm/s
RIGHT ECA DIAS: -14 cm/s
RIGHT VERTEBRAL DIAS: 11 cm/s
Right cca dist sys: -62 cm/s

## 2015-08-02 LAB — CBC
HEMATOCRIT: 36.8 % — AB (ref 39.0–52.0)
HEMOGLOBIN: 12.3 g/dL — AB (ref 13.0–17.0)
MCH: 29.4 pg (ref 26.0–34.0)
MCHC: 33.4 g/dL (ref 30.0–36.0)
MCV: 87.8 fL (ref 78.0–100.0)
Platelets: 236 10*3/uL (ref 150–400)
RBC: 4.19 MIL/uL — ABNORMAL LOW (ref 4.22–5.81)
RDW: 14.7 % (ref 11.5–15.5)
WBC: 7.9 10*3/uL (ref 4.0–10.5)

## 2015-08-02 LAB — URINE MICROSCOPIC-ADD ON
Bacteria, UA: NONE SEEN
SQUAMOUS EPITHELIAL / LPF: NONE SEEN

## 2015-08-02 LAB — URINALYSIS, ROUTINE W REFLEX MICROSCOPIC
BILIRUBIN URINE: NEGATIVE
Glucose, UA: NEGATIVE mg/dL
KETONES UR: NEGATIVE mg/dL
LEUKOCYTES UA: NEGATIVE
NITRITE: NEGATIVE
PROTEIN: NEGATIVE mg/dL
Specific Gravity, Urine: 1.011 (ref 1.005–1.030)
pH: 7.5 (ref 5.0–8.0)

## 2015-08-02 LAB — PROTIME-INR
INR: 1.04 (ref 0.00–1.49)
PROTHROMBIN TIME: 13.8 s (ref 11.6–15.2)

## 2015-08-02 LAB — GLUCOSE, CAPILLARY
GLUCOSE-CAPILLARY: 108 mg/dL — AB (ref 65–99)
GLUCOSE-CAPILLARY: 97 mg/dL (ref 65–99)
GLUCOSE-CAPILLARY: 112 mg/dL — AB (ref 65–99)
Glucose-Capillary: 126 mg/dL — ABNORMAL HIGH (ref 65–99)

## 2015-08-02 LAB — HEPARIN LEVEL (UNFRACTIONATED)
Heparin Unfractionated: 0.56 IU/mL (ref 0.30–0.70)
Heparin Unfractionated: 0.33 IU/mL (ref 0.30–0.70)
Heparin Unfractionated: 0.59 IU/mL (ref 0.30–0.70)

## 2015-08-02 MED ORDER — CHLORHEXIDINE GLUCONATE 0.12 % MT SOLN
15.0000 mL | Freq: Once | OROMUCOSAL | Status: AC
  Administered 2015-08-05: 15 mL via OROMUCOSAL
  Filled 2015-08-02: qty 15

## 2015-08-02 MED ORDER — CHLORHEXIDINE GLUCONATE 4 % EX LIQD
60.0000 mL | Freq: Once | CUTANEOUS | Status: AC
  Administered 2015-08-04: 4 via TOPICAL
  Filled 2015-08-02: qty 120

## 2015-08-02 MED ORDER — DIAZEPAM 5 MG PO TABS
5.0000 mg | ORAL_TABLET | Freq: Once | ORAL | Status: AC
  Administered 2015-08-05: 5 mg via ORAL
  Filled 2015-08-02: qty 1

## 2015-08-02 MED ORDER — ALPRAZOLAM 0.25 MG PO TABS
0.2500 mg | ORAL_TABLET | ORAL | Status: DC | PRN
  Administered 2015-08-06: 0.25 mg via ORAL
  Filled 2015-08-02: qty 2

## 2015-08-02 MED ORDER — BISACODYL 5 MG PO TBEC
5.0000 mg | DELAYED_RELEASE_TABLET | Freq: Once | ORAL | Status: AC
  Administered 2015-08-04: 5 mg via ORAL
  Filled 2015-08-02: qty 1

## 2015-08-02 MED ORDER — CHLORHEXIDINE GLUCONATE 4 % EX LIQD
60.0000 mL | Freq: Once | CUTANEOUS | Status: AC
  Administered 2015-08-05: 4 via TOPICAL

## 2015-08-02 MED ORDER — METOPROLOL TARTRATE 12.5 MG HALF TABLET
12.5000 mg | ORAL_TABLET | Freq: Once | ORAL | Status: AC
  Administered 2015-08-05: 12.5 mg via ORAL
  Filled 2015-08-02: qty 1

## 2015-08-02 MED ORDER — ALBUTEROL SULFATE (2.5 MG/3ML) 0.083% IN NEBU
2.5000 mg | INHALATION_SOLUTION | Freq: Once | RESPIRATORY_TRACT | Status: AC
  Administered 2015-08-02: 2.5 mg via RESPIRATORY_TRACT

## 2015-08-02 MED ORDER — TEMAZEPAM 15 MG PO CAPS: 15.0000 mg | ORAL_CAPSULE | Freq: Once | ORAL | Status: DC | PRN

## 2015-08-02 NOTE — Progress Notes (Signed)
Pt just walked with RN. Sts he has had no angina since being on NTG drip. Encouraged more walking if tolerated. Discussed preop ed. He has booklet. Gave pt video to watch and guideline to read. Girlfriend present and can be with him at d/c. Voiced understanding. I discussed IS but RN is ordering one from Respiratory Therapy. [phone] Ethelda ChickKristan Tomoko Sandra CES, ACSM 1:55 PM 08/02/2015

## 2015-08-02 NOTE — Progress Notes (Signed)
ANTICOAGULATION CONSULT NOTE Pharmacy Consult for Heparin  Indication:  3V CAD, for TCTS consult re: CABG  Allergies  Allergen Reactions  . Crestor [Rosuvastatin] Other (See Comments)    "Makes my legs hurt" - tolerates simvastatin     Patient Measurements: Height: 5\' 11"  (180.3 cm) Weight: 155 lb 6.4 oz (70.489 kg) IBW/kg (Calculated) : 75.3 Heparin Dosing Weight: N/A - TBW < IBW  Vital Signs: Temp: 97.6 F (36.4 C) (07/14 1245) Temp Source: Oral (07/14 1245) BP: 127/83 mmHg (07/14 0837) Pulse Rate: 60 (07/14 0837)  Labs:  Recent Labs  07/31/15 1051 08/01/15 2126 08/02/15 0448 08/02/15 0637 08/02/15 1254  HGB 12.7*  --  12.3*  --   --   HCT 38.1*  --  36.8*  --   --   PLT 251  --  236  --   --   LABPROT 10.2  --   --   --  13.8  INR 1.0  --   --   --  1.04  HEPARINUNFRC  --  0.15*  --  0.56 0.33  CREATININE 1.65*  --  1.35*  --   --     Estimated Creatinine Clearance: 63.8 mL/min (by C-G formula based on Cr of 1.35).   Medical History: Past Medical History  Diagnosis Date  . ELECTROCARDIOGRAM, ABNORMAL   . Coronary artery disease   . Anginal pain (HCC)   . Hypertension   . GERD (gastroesophageal reflux disease)   . High cholesterol   . Peripheral vascular disease (HCC)     LLE  . Pneumonia 1990's  . Shortness of breath 09/18/11    "@ rest; lying down; w/exertion"  . Lower GI bleed 09/18/2011    "clots and everything; not lately"  . Headache(784.0) 09/18/2011    "maybe twice/wk; pressure on the brain"  . Chronic lower back pain   . Gout   . Anxiety   . Kidney stones   . Myocardial infarction (HCC) 10/2005  . Childhood asthma   . Type I diabetes mellitus (HCC)   . Seizures (HCC) 09/18/2011    "blanks out on me"/wife's report    Medications:  Scheduled:  . allopurinol  100 mg Oral Daily  . aspirin EC  81 mg Oral Daily  . atorvastatin  80 mg Oral q1800  . omega-3 acid ethyl esters  1 g Oral BID  . sodium chloride flush  3 mL Intravenous Q12H    Infusions:  . heparin 1,100 Units/hr (08/01/15 2349)  . nitroGLYCERIN 5 mcg/min (08/01/15 1102)   PRN: sodium chloride, acetaminophen, ALPRAZolam, morphine injection, nitroGLYCERIN, ondansetron (ZOFRAN) IV, sodium chloride flush  Assessment: 50 y/o male presented with chest pain 7/12. Past medical history significant for CAD s/p multiple PCI procedures, HTN, DM, HLD, and hx of GI bleed in 2013. Pharmacy was consulted to dose post-cath heparin 8 hours after sheath removed.  Heparin level remains therapeutic at 0.33 this afternoon, CBC stable, no bleeding noted. Per RN the gtt was off for ~30-45 minutes this morning around 1100 and turned back on around 1200. Will continue current rate and f/u level in the evening.   Goal of Therapy Heparin level 0.3-0.7 units/ml Monitor platelets by anticoagulation protocol: Yes   Plan:  Continue Heparin 1100 units/hr Check anti-Xa level in 6 hours and daily while on heparin Continue to monitor H&H and platelets   Thank you for allowing us to participate in this patients care. Signe Coltonya C Javanni Maring, PharmD Pager: [phone] 08/02/2015,2:29 PM

## 2015-08-02 NOTE — Progress Notes (Signed)
Pre-op Cardiac Surgery  Carotid Findings:  Bilateral: No significant ICA stenosis. Antegrade vertebral flow.    Upper Extremity Right Left  Brachial Pressures 124 121  Radial Waveforms Tri Tri  Ulnar Waveforms Tri Tri  Palmar Arch (Allen's Test) Normal Allen's test Normal Allen's test   Pedal artery waveforms within normal limits.   Farrel DemarkJill Eunice, RDMS, RVT 08/02/2015

## 2015-08-02 NOTE — Care Management Note (Signed)
Case Management Note  Patient Details  Name: Willie Walker MRN: [id number] Date of Birth: 10/27/1965  Subjective/Objective:    Pt presented for chest pain. Post cardiac cath revealed- Critical 3 vessel obstructive CAD.  CT surgery consult for CABG-plan for 08-05-15. Pt is from home with his mother. Pt has fiancee. [facility]: Quentin Angstlugbemiga E Jegede, MD. Per pt he is able to get his medications at the clinic pharmacy. Financial Counselor is following the patient due to no insurance.                Action/Plan: CM will continue to monitor for disposition needs post procedure.    Expected Discharge Date:                  Expected Discharge Plan:  Home w Home Health Services  In-House Referral:     Discharge planning Services  CM Consult  Post Acute Care Choice:    Choice offered to:     DME Arranged:    DME Agency:     HH Arranged:    HH Agency:     Status of Service:  In process, will continue to follow  If discussed at Long Length of Stay Meetings, dates discussed:    Additional Comments:  Gala LewandowskyGraves-Bigelow, Renuka Farfan Kaye, RN 08/02/2015, 3:54 PM
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
## 2015-08-02 NOTE — Progress Notes (Signed)
ANTICOAGULATION CONSULT NOTE Pharmacy Consult for Heparin  Indication:  3V CAD, for TCTS consult re: CABG  Allergies  Allergen Reactions  . Crestor [Rosuvastatin] Other (See Comments)    "Makes my legs hurt" - tolerates simvastatin     Patient Measurements: Height:  (180.3 cm) Weight: 155 lb 6.4 oz (70.489 kg) IBW/kg (Calculated) : 75.3 Heparin Dosing Weight: N/A - TBW < IBW  Vital Signs: Temp: 98 F (36.7 C) (07/14 1943) Temp Source: Oral (07/14 1943) BP: 124/77 mmHg (07/14 1943) Pulse Rate: 58 (07/14 1943)  Labs:  Recent Labs  07/31/15 1051  08/02/15 0448 08/02/15 0637 08/02/15 1254 08/02/15 2114  HGB 12.7*  --  12.3*  --   --   --   HCT 38.1*  --  36.8*  --   --   --   PLT 251  --  236  --   --   --   LABPROT 10.2  --   --   --  13.8  --   INR 1.0  --   --   --  1.04  --   HEPARINUNFRC  --   < >  --  0.56 0.33 0.59  CREATININE 1.65*  --  1.35*  --   --   --   < > = values in this interval not displayed.  Estimated Creatinine Clearance: 63.8 mL/min (by C-G formula based on Cr of 1.35).   Medical History: Past Medical History  Diagnosis Date  . ELECTROCARDIOGRAM, ABNORMAL   . Coronary artery disease   . Anginal pain ([facility])   . Hypertension   . GERD (gastroesophageal reflux disease)   . High cholesterol   . Peripheral vascular disease (H[facility])     LLE  . Pneumonia 1990's  . Shortness of breath 09/18/11    "@ rest; lying down; w/exertion"  . Lower GI bleed 09/18/2011    "clots and everything; not lately"  . Headache(784.0) 09/18/2011    "maybe twice/wk; pressure on the brain"  . Chronic lower back pain   . Gout   . Anxiety   . Kidney stones   . Myocardial infarction ([facility]) 10/2005  . Childhood asthma   . Type I diabetes mellitus ([facility])   . Seizures ([facility]) 09/18/2011    "blanks out on me"/wife's report    Medications:  Scheduled:  . allopurinol  100 mg Oral Daily  . aspirin EC  81 mg Oral Daily  . atorvastatin  80 mg Oral q1800  . [START ON  08/04/2015] bisacodyl  5 mg Oral Once  . [START ON 08/04/2015] chlorhexidine  60 mL Topical Once   And  . [START ON 08/05/2015] chlorhexidine  60 mL Topical Once  . [START ON 08/05/2015] chlorhexidine  15 mL Mouth/Throat Once  . [START ON 08/05/2015] diazepam  5 mg Oral Once  . [START ON 08/05/2015] metoprolol tartrate  12.5 mg Oral Once  . omega-3 acid ethyl esters  1 g Oral BID  . sodium chloride flush  3 mL Intravenous Q12H   Infusions:  . heparin 1,100 Units/hr (08/02/15 1836)  . nitroGLYCERIN 5 mcg/min (08/01/15 1102)   PRN: sodium chloride, acetaminophen, ALPRAZolam, morphine injection, nitroGLYCERIN, ondansetron (ZOFRAN) IV, sodium chloride flush, [START ON 08/04/2015] temazepam  Assessment: 50 y/o male presented with chest pain 7/12. Past medical history significant for CAD s/p multiple PCI procedures, HTN, DM, HLD, and hx of GI bleed in 2013. Pharmacy was consulted to dose post-cath heparin 8 hours after sheath removed.  Heparin  level therapeutic   Goal of Therapy Heparin level 0.3-0.7 units/ml Monitor platelets by anticoagulation protocol: Yes   Plan:  Continue Heparin 1100 units/hr Follow up AM labs Continue to monitor H&H and platelets  Thank you Okey RegalLisa Willella Harding, PharmD [phone] 08/02/2015,9:58 PM

## 2015-08-02 NOTE — Progress Notes (Deleted)
      [address]       [place] [id number]             [phone]      Recently extubated  Denies pain. Still c/o numbness in right leg unchanged from preop. He has 5/5 strength in B UE and L LE, but is weaker on right LE. Compartments are soft. Sensory level about at the knee.  This could be due to CNS event or due to peripheral circulatory compromise yesterday- foot is well perfused now  Will ask Neuro to see  PT consult  His ECG is essentially unchanged  Viviann SpareSteven C. Dorris FetchHendrickson, MD [facility] [phone]

## 2015-08-02 NOTE — Progress Notes (Signed)
       Patient Name: Willie Walker Date of Encounter: 08/02/2015    SUBJECTIVE: He has been seen by Dr. Dorris FetchHendrickson who's recommendations are noted and appreciated.  The patient denies chest discomfort and dyspnea. 7 mild headache on IV nitroglycerin.  TELEMETRY:  Normal sinus rhythm Filed Vitals:   08/01/15 2352 08/02/15 0632 08/02/15 0837 08/02/15 1245  BP: 109/69 101/66 127/83   Pulse: 70 61 60   Temp: 98 F (36.7 C) 97.7 F (36.5 C) 98.2 F (36.8 C) 97.6 F (36.4 C)  TempSrc: Oral Oral Oral Oral  Resp: 16 16    Height:      Weight:  155 lb 6.4 oz (70.489 kg)    SpO2: 99% 99% 100%     Intake/Output Summary (Last 24 hours) at 08/02/15 1430 Last data filed at 08/02/15 0826  Gross per 24 hour  Intake      3 ml  Output   2676 ml  Net  -2673 ml   LABS: Basic Metabolic Panel:  Recent Labs  16/10/9605/12/17 1051 08/02/15 0448  NA 138 137  K 4.9 4.2  CL 107 103  CO2 24 24  GLUCOSE 95 87  BUN 26* 20  CREATININE 1.65* 1.35*  CALCIUM 9.9 9.1   CBC:  Recent Labs  07/31/15 1051 08/02/15 0448  WBC 6.0 7.9  NEUTROABS 1500  --   HGB 12.7* 12.3*  HCT 38.1* 36.8*  MCV 88.0 87.8  PLT 251 236    Fasting Lipid Panel:  Recent Labs  07/31/15 1051  CHOL 232*  HDL 53  LDLCALC 159*  TRIG 101  CHOLHDL 4.4    Radiology/Studies:  There is no active cardiopulmonary disease on chest x-ray  Physical Exam: Blood pressure 127/83, pulse 60, temperature 97.6 F (36.4 C), temperature source Oral, resp. rate 16, height 5\' 11"  (1.803 m), weight 155 lb 6.4 oz (70.489 kg), SpO2 100 %. Weight change: -4 lb 9.6 oz (-2.087 kg)  Wt Readings from Last 3 Encounters:  08/02/15 155 lb 6.4 oz (70.489 kg)  07/31/15 159 lb (72.122 kg)  11/19/14 159 lb (72.122 kg)    Right radial cath site is unremarkable Lungs clear to auscultation and percussion Cardiac exam reveals no gallop, rub, or click.  ASSESSMENT:  1. Severe, heavily stented, diffusely diseased three-vessel coronary  disease with unstable angina pectoris developing over the past month. 2. Hyperlipidemia, poorly treated 3. Essential hypertension 4. Diabetes mellitus, type II  Plan:  1. Plan coronary artery bypass grafting on Monday 2. Continue heparin and IV nitroglycerin  Signed, Lyn RecordsHenry W Skilynn Durney III 08/02/2015, 2:30 PM

## 2015-08-02 NOTE — Progress Notes (Signed)
Pt has taken several walks today. Cardiac Surgery booklet given. Pt agrees to watch videos tomorrow. Cecille Rubinhompson,Safari Cinque V, RN

## 2015-08-02 NOTE — Progress Notes (Signed)
ANTICOAGULATION CONSULT NOTE Pharmacy Consult for Heparin  Indication:  3V CAD, for TCTS consult re: CABG  Allergies  Allergen Reactions  . Crestor [Rosuvastatin] Other (See Comments)    "Makes my legs hurt" - tolerates simvastatin     Patient Measurements: Height: 5\' 11"  (180.3 cm) Weight: 155 lb 6.4 oz (70.489 kg) IBW/kg (Calculated) : 75.3 Heparin Dosing Weight: N/A - TBW < IBW  Vital Signs: Temp: 98.2 F (36.8 C) (07/14 0837) Temp Source: Oral (07/14 0837) BP: 127/83 mmHg (07/14 0837) Pulse Rate: 60 (07/14 0837)  Labs:  Recent Labs  07/31/15 1051 08/01/15 2126 08/02/15 0448 08/02/15 0637  HGB 12.7*  --  12.3*  --   HCT 38.1*  --  36.8*  --   PLT 251  --  236  --   LABPROT 10.2  --   --   --   INR 1.0  --   --   --   HEPARINUNFRC  --  0.15*  --  0.56  CREATININE 1.65*  --  1.35*  --     Estimated Creatinine Clearance: 63.8 mL/min (by C-G formula based on Cr of 1.35).   Medical History: Past Medical History  Diagnosis Date  . ELECTROCARDIOGRAM, ABNORMAL   . Coronary artery disease   . Anginal pain ([facility])   . Hypertension   . GERD (gastroesophageal reflux disease)   . High cholesterol   . Peripheral vascular disease ([facility])     LLE  . Pneumonia 1990's  . Shortness of breath 09/18/11    "@ rest; lying down; w/exertion"  . Lower GI bleed 09/18/2011    "clots and everything; not lately"  . Headache(784.0) 09/18/2011    "maybe twice/wk; pressure on the brain"  . Chronic lower back pain   . Gout   . Anxiety   . Kidney stones   . Myocardial infarction ([facility]) 10/2005  . Childhood asthma   . Type I diabetes mellitus ([facility])   . Seizures ([facility]) 09/18/2011    "blanks out on me"/wife's report    Medications:  Scheduled:  . allopurinol  100 mg Oral Daily  . aspirin EC  81 mg Oral Daily  . atorvastatin  80 mg Oral q1800  . omega-3 acid ethyl esters  1 g Oral BID  . pneumococcal 23 valent vaccine  0.5 mL Intramuscular Tomorrow-1000  . sodium chloride flush  3 mL  Intravenous Q12H   Infusions:  . heparin 1,100 Units/hr (08/01/15 2349)  . nitroGLYCERIN 5 mcg/min (08/01/15 1102)   PRN: sodium chloride, acetaminophen, morphine injection, nitroGLYCERIN, ondansetron (ZOFRAN) IV, sodium chloride flush  Assessment: 50 y/o male presented with chest pain 7/12. Past medical history significant for CAD s/p multiple PCI procedures, HTN, DM, HLD, and hx of GI bleed in 2013. Pharmacy was consulted to dose post-cath heparin 8 hours after sheath removed.  Heparin level therapeutic this morning at 0.56, CBC stable, no bleeding noted.   Goal of Therapy Heparin level 0.3-0.7 units/ml Monitor platelets by anticoagulation protocol: Yes   Plan:  Continue Heparin 1100 units / hr Check anti-Xa level daily while on heparin Continue to monitor H&H and platelets   Thank you for allowing us to participate in this patients care. Signe Coltonya C Brody Kump, PharmD Pager: [phone] 08/02/2015,10:01 AM

## 2015-08-03 LAB — BLOOD GAS, ARTERIAL
ACID-BASE DEFICIT: 0.3 mmol/L (ref 0.0–2.0)
BICARBONATE: 23.7 meq/L (ref 20.0–24.0)
Drawn by: 405301
FIO2: 0.21
O2 SAT: 97.5 %
PATIENT TEMPERATURE: 97.7
PO2 ART: 98 mmHg (ref 80.0–100.0)
TCO2: 24.9 mmol/L (ref 0–100)
pCO2 arterial: 37.1 mmHg (ref 35.0–45.0)
pH, Arterial: 7.42 (ref 7.350–7.450)

## 2015-08-03 LAB — CBC
HCT: 38.4 % — ABNORMAL LOW (ref 39.0–52.0)
Hemoglobin: 12.7 g/dL — ABNORMAL LOW (ref 13.0–17.0)
MCH: 29.1 pg (ref 26.0–34.0)
MCHC: 33.1 g/dL (ref 30.0–36.0)
MCV: 87.9 fL (ref 78.0–100.0)
PLATELETS: 256 10*3/uL (ref 150–400)
RBC: 4.37 MIL/uL (ref 4.22–5.81)
RDW: 14.4 % (ref 11.5–15.5)
WBC: 9.2 10*3/uL (ref 4.0–10.5)

## 2015-08-03 LAB — GLUCOSE, CAPILLARY
Glucose-Capillary: 118 mg/dL — ABNORMAL HIGH (ref 65–99)
Glucose-Capillary: 102 mg/dL — ABNORMAL HIGH (ref 65–99)
Glucose-Capillary: 121 mg/dL — ABNORMAL HIGH (ref 65–99)
Glucose-Capillary: 144 mg/dL — ABNORMAL HIGH (ref 65–99)

## 2015-08-03 LAB — HEPARIN LEVEL (UNFRACTIONATED): HEPARIN UNFRACTIONATED: 0.61 [IU]/mL (ref 0.30–0.70)

## 2015-08-03 LAB — ABO/RH: ABO/RH(D): O POS

## 2015-08-03 LAB — SURGICAL PCR SCREEN
MRSA, PCR: NEGATIVE
Staphylococcus aureus: POSITIVE — AB

## 2015-08-03 LAB — HEMOGLOBIN A1C
HEMOGLOBIN A1C: 5.7 % — AB (ref 4.8–5.6)
MEAN PLASMA GLUCOSE: 117 mg/dL

## 2015-08-03 NOTE — Progress Notes (Signed)
Subjective:  Continues on IV heparin.  No recurrent chest pain overnight.  Headache has resolved.  Objective:  Vital Signs in the last 24 hours: BP 109/76 mmHg  Pulse 64  Temp(Src) 98.2 F (36.8 C) (Oral)  Resp 18  Ht 5\' 11"  (1.803 m)  Wt 70.171 kg (154 lb 11.2 oz)  BMI 21.59 kg/m2  SpO2 100%  Physical Exam: Thin black male in no acute distress Lungs:  Clear Cardiac:  Regular rhythm, normal S1 and S2, no S3 Extremities:  No edema present  Intake/Output from previous day: 07/14 0701 - 07/15 0700 In: 240 [P.O.:240] Out: 2200 [Urine:2200]  Weight Filed Weights   08/01/15 0636 08/02/15 0632 08/03/15 0359  Weight: 72.576 kg (160 lb) 70.489 kg (155 lb 6.4 oz) 70.171 kg (154 lb 11.2 oz)    Lab Results: Basic Metabolic Panel:  Recent Labs  16/10/9605/12/17 1051 08/02/15 0448  NA 138 137  K 4.9 4.2  CL 107 103  CO2 24 24  GLUCOSE 95 87  BUN 26* 20  CREATININE 1.65* 1.35*   CBC:  Recent Labs  07/31/15 1051 08/02/15 0448 08/03/15 0652  WBC 6.0 7.9 9.2  NEUTROABS 1500  --   --   HGB 12.7* 12.3* 12.7*  HCT 38.1* 36.8* 38.4*  MCV 88.0 87.8 87.9  PLT 251 236 256   Telemetry: Normal sinus rhythm  Assessment/Plan:  1.  Severe three-vessel coronary artery disease planned for bypass grafting on Monday 2.  Hyperlipidemia 3.  Hypertension currently controlled  Recommendations:  On intravenous heparin with no chest pain.  Headache has resolved.  Bypass grafting planned for Monday.  Questions answered about surgery.  Importance of lifestyle changes following surgery discussed with patient.      Darden PalmerW. Spencer Barclay Lennox, Jr.  MD [facility]  08/03/2015, 8:51 AM

## 2015-08-03 NOTE — Progress Notes (Signed)
ANTICOAGULATION CONSULT NOTE Pharmacy Consult for Heparin  Indication:  3V CAD, for TCTS consult re: CABG  Allergies  Allergen Reactions  . Crestor [Rosuvastatin] Other (See Comments)    "Makes my legs hurt" - tolerates simvastatin     Patient Measurements: Height:  (180.3 cm) Weight: 154 lb 11.2 oz (70.171 kg) IBW/kg (Calculated) : 75.3 Heparin Dosing Weight: N/A - TBW < IBW  Vital Signs: Temp: 98.2 F (36.8 C) (07/15 0817) Temp Source: Oral (07/15 0817) BP: 109/76 mmHg (07/15 0817) Pulse Rate: 64 (07/15 0817)  Labs:  Recent Labs  08/02/15 0448  08/02/15 1254 08/02/15 2114 08/03/15 0652  HGB 12.3*  --   --   --  12.7*  HCT 36.8*  --   --   --  38.4*  PLT 236  --   --   --  256  LABPROT  --   --  13.8  --   --   INR  --   --  1.04  --   --   HEPARINUNFRC  --   < > 0.33 0.59 0.61  CREATININE 1.35*  --   --   --   --   < > = values in this interval not displayed.  Estimated Creatinine Clearance: 63.6 mL/min (by C-G formula based on Cr of 1.35).   Medical History: Past Medical History  Diagnosis Date  . ELECTROCARDIOGRAM, ABNORMAL   . Coronary artery disease   . Anginal pain (HCC)   . Hypertension   . GERD (gastroesophageal reflux disease)   . High cholesterol   . Peripheral vascular disease ([facility])     LLE  . Pneumonia 1990's  . Shortness of breath 09/18/11    "@ rest; lying down; w/exertion"  . Lower GI bleed 09/18/2011    "clots and everything; not lately"  . Headache(784.0) 09/18/2011    "maybe twice/wk; pressure on the brain"  . Chronic lower back pain   . Gout   . Anxiety   . Kidney stones   . Myocardial infarction ([facility]) 10/2005  . Childhood asthma   . Type I diabetes mellitus ([facility])   . Seizures (HCC) 09/18/2011    "blanks out on me"/wife's report    Medications:  Scheduled:  . allopurinol  100 mg Oral Daily  . aspirin EC  81 mg Oral Daily  . atorvastatin  80 mg Oral q1800  . [START ON 08/04/2015] bisacodyl  5 mg Oral Once  . [START ON  08/04/2015] chlorhexidine  60 mL Topical Once   And  . [START ON 08/05/2015] chlorhexidine  60 mL Topical Once  . [START ON 08/05/2015] chlorhexidine  15 mL Mouth/Throat Once  . [START ON 08/05/2015] diazepam  5 mg Oral Once  . [START ON 08/05/2015] metoprolol tartrate  12.5 mg Oral Once  . omega-3 acid ethyl esters  1 g Oral BID  . sodium chloride flush  3 mL Intravenous Q12H   Infusions:  . heparin 1,100 Units/hr (08/02/15 1836)  . nitroGLYCERIN 5 mcg/min (08/01/15 1102)   PRN: sodium chloride, acetaminophen, ALPRAZolam, morphine injection, nitroGLYCERIN, ondansetron (ZOFRAN) IV, sodium chloride flush, [START ON 08/04/2015] temazepam  Assessment: 50 y/o male presented with chest pain 7/12. Past medical history significant for CAD s/p multiple PCI procedures, HTN, DM, HLD, and hx of GI bleed in 2013. Pharmacy was consulted to dose post-cath heparin 8 hours after sheath removed.  Heparin level is therapeutic today (0.6 units/ml). H&H and platelets wnl. No s/sx of bleeding noted.  Goal of Therapy Heparin level 0.3-0.7 units/ml Monitor platelets by anticoagulation protocol: Yes   Plan:  Continue Heparin 1100 IV units/hr. Continue to monitor heparin levels, H&H, and platelets daily. Continue to monitor for s/sx of bleeding.  Carylon PerchesMaggie Locklan Canoy, PharmD Acute Care Pharmacy Resident  Pager: [phone] 08/03/2015

## 2015-08-04 LAB — CBC
HCT: 39 % (ref 39.0–52.0)
HEMOGLOBIN: 12.9 g/dL — AB (ref 13.0–17.0)
MCH: 29 pg (ref 26.0–34.0)
MCHC: 33.1 g/dL (ref 30.0–36.0)
MCV: 87.6 fL (ref 78.0–100.0)
Platelets: 260 10*3/uL (ref 150–400)
RBC: 4.45 MIL/uL (ref 4.22–5.81)
RDW: 14.6 % (ref 11.5–15.5)
WBC: 5.7 10*3/uL (ref 4.0–10.5)

## 2015-08-04 LAB — HEPARIN LEVEL (UNFRACTIONATED): Heparin Unfractionated: 0.6 IU/mL (ref 0.30–0.70)

## 2015-08-04 LAB — GLUCOSE, CAPILLARY
GLUCOSE-CAPILLARY: 135 mg/dL — AB (ref 65–99)
GLUCOSE-CAPILLARY: 138 mg/dL — AB (ref 65–99)
Glucose-Capillary: 126 mg/dL — ABNORMAL HIGH (ref 65–99)
Glucose-Capillary: 93 mg/dL (ref 65–99)

## 2015-08-04 MED ORDER — DEXTROSE 5 % IV SOLN
1.5000 g | INTRAVENOUS | Status: AC
  Administered 2015-08-05: 1.5 g via INTRAVENOUS
  Administered 2015-08-05: .75 g via INTRAVENOUS
  Filled 2015-08-04: qty 1.5

## 2015-08-04 MED ORDER — MUPIROCIN 2 % EX OINT
1.0000 "application " | TOPICAL_OINTMENT | Freq: Two times a day (BID) | CUTANEOUS | Status: DC
  Administered 2015-08-04 (×2): 1 via NASAL
  Filled 2015-08-04: qty 22

## 2015-08-04 MED ORDER — DEXMEDETOMIDINE HCL IN NACL 400 MCG/100ML IV SOLN
0.1000 ug/kg/h | INTRAVENOUS | Status: AC
  Administered 2015-08-05: .5 ug/kg/h via INTRAVENOUS
  Filled 2015-08-04: qty 100

## 2015-08-04 MED ORDER — PLASMA-LYTE 148 IV SOLN
INTRAVENOUS | Status: AC
  Administered 2015-08-05: 500 mL
  Filled 2015-08-04: qty 2.5

## 2015-08-04 MED ORDER — SODIUM CHLORIDE 0.9 % IV SOLN
INTRAVENOUS | Status: AC
  Administered 2015-08-05: 1 [IU]/h via INTRAVENOUS
  Filled 2015-08-04: qty 2.5

## 2015-08-04 MED ORDER — POTASSIUM CHLORIDE 2 MEQ/ML IV SOLN
80.0000 meq | INTRAVENOUS | Status: DC
  Filled 2015-08-04: qty 40

## 2015-08-04 MED ORDER — TRANEXAMIC ACID (OHS) BOLUS VIA INFUSION
15.0000 mg/kg | INTRAVENOUS | Status: DC
  Filled 2015-08-04: qty 1056

## 2015-08-04 MED ORDER — DEXTROSE 5 % IV SOLN
750.0000 mg | INTRAVENOUS | Status: DC
  Filled 2015-08-04: qty 750

## 2015-08-04 MED ORDER — PHENYLEPHRINE HCL 10 MG/ML IJ SOLN
30.0000 ug/min | INTRAVENOUS | Status: DC
  Filled 2015-08-04: qty 2

## 2015-08-04 MED ORDER — DOPAMINE-DEXTROSE 3.2-5 MG/ML-% IV SOLN
0.0000 ug/kg/min | INTRAVENOUS | Status: DC
  Filled 2015-08-04: qty 250

## 2015-08-04 MED ORDER — NITROGLYCERIN IN D5W 200-5 MCG/ML-% IV SOLN
2.0000 ug/min | INTRAVENOUS | Status: DC
  Filled 2015-08-04: qty 250

## 2015-08-04 MED ORDER — VERAPAMIL HCL 2.5 MG/ML IV SOLN
INTRAVENOUS | Status: DC
  Filled 2015-08-04 (×2): qty 3.3

## 2015-08-04 MED ORDER — SODIUM CHLORIDE 0.9 % IV SOLN
INTRAVENOUS | Status: AC
  Administered 2015-08-05: 69.8 mL/h via INTRAVENOUS
  Filled 2015-08-04: qty 40

## 2015-08-04 MED ORDER — CHLORHEXIDINE GLUCONATE CLOTH 2 % EX PADS: 6.0000 | MEDICATED_PAD | Freq: Every day | CUTANEOUS | Status: DC

## 2015-08-04 MED ORDER — VANCOMYCIN HCL 10 G IV SOLR
1250.0000 mg | INTRAVENOUS | Status: AC
  Administered 2015-08-05: 1250 mg via INTRAVENOUS
  Filled 2015-08-04: qty 1250

## 2015-08-04 MED ORDER — MAGNESIUM SULFATE 50 % IJ SOLN
40.0000 meq | INTRAMUSCULAR | Status: DC
  Filled 2015-08-04: qty 10

## 2015-08-04 MED ORDER — EPINEPHRINE HCL 1 MG/ML IJ SOLN
0.0000 ug/min | INTRAVENOUS | Status: DC
  Filled 2015-08-04: qty 4

## 2015-08-04 MED ORDER — HEPARIN SODIUM (PORCINE) 1000 UNIT/ML IJ SOLN
INTRAMUSCULAR | Status: DC
  Filled 2015-08-04: qty 30

## 2015-08-04 NOTE — Progress Notes (Signed)
Subjective:  Continues on IV heparin.  No recurrent chest pain overnight.  No shortness of breath.  Objective:  Vital Signs in the last 24 hours: BP 110/87 mmHg  Pulse 61  Temp(Src) 97.9 F (36.6 C) (Oral)  Resp 18  Ht 5\' 11"  (1.803 m)  Wt 70.353 kg (155 lb 1.6 oz)  BMI 21.64 kg/m2  SpO2 100%  Physical Exam: Thin black male in no acute distress Lungs:  Clear Cardiac:  Regular rhythm, normal S1 and S2, no S3 Extremities:  No edema present  Intake/Output from previous day: 07/15 0701 - 07/16 0700 In: 658 [P.O.:658] Out: 2600 [Urine:2600]  Weight Filed Weights   08/02/15 0632 08/03/15 0359 08/04/15 0352  Weight: 70.489 kg (155 lb 6.4 oz) 70.171 kg (154 lb 11.2 oz) 70.353 kg (155 lb 1.6 oz)    Lab Results: Basic Metabolic Panel:  Recent Labs  16/10/9605/14/17 0448  NA 137  K 4.2  CL 103  CO2 24  GLUCOSE 87  BUN 20  CREATININE 1.35*   CBC:  Recent Labs  08/03/15 0652 08/04/15 0301  WBC 9.2 5.7  HGB 12.7* 12.9*  HCT 38.4* 39.0  MCV 87.9 87.6  PLT 256 260   Telemetry: Normal sinus rhythm  Assessment/Plan:  1.  Severe three-vessel coronary artery disease planned for bypass grafting on Monday 2.  Hyperlipidemia 3.  Hypertension currently controlled  Recommendations:  No recurrent chest discomfort overnight, complained of a slight cramp in his left side where he slept but different than his previous angina.  Stable for surgery.  Questions answered.   Darden PalmerW. Spencer Tilley, Jr.  MD [facility]asFACC Cardiology  08/04/2015, 9:32 AM

## 2015-08-04 NOTE — Progress Notes (Signed)
ANTICOAGULATION CONSULT NOTE Pharmacy Consult for Heparin  Indication:  3V CAD, for TCTS consult re: CABG  Allergies  Allergen Reactions  . Crestor [Rosuvastatin] Other (See Comments)    "Makes my legs hurt" - tolerates simvastatin     Patient Measurements: Height: 5\' 11"  (180.3 cm) Weight: 155 lb 1.6 oz (70.353 kg) IBW/kg (Calculated) : 75.3 Heparin Dosing Weight: N/A - TBW < IBW  Vital Signs: Temp: 97.9 F (36.6 C) (07/16 0732) Temp Source: Oral (07/16 0732) BP: 110/87 mmHg (07/16 0732) Pulse Rate: 61 (07/16 0732)  Labs:  Recent Labs  08/02/15 0448  08/02/15 1254 08/02/15 2114 08/03/15 0652 08/04/15 0300 08/04/15 0301  HGB 12.3*  --   --   --  12.7*  --  12.9*  HCT 36.8*  --   --   --  38.4*  --  39.0  PLT 236  --   --   --  256  --  260  LABPROT  --   --  13.8  --   --   --   --   INR  --   --  1.04  --   --   --   --   HEPARINUNFRC  --   < > 0.33 0.59 0.61 0.60  --   CREATININE 1.35*  --   --   --   --   --   --   < > = values in this interval not displayed.  Estimated Creatinine Clearance: 63.7 mL/min (by C-G formula based on Cr of 1.35).   Medical History: Past Medical History  Diagnosis Date  . ELECTROCARDIOGRAM, ABNORMAL   . Coronary artery disease   . Anginal pain (HCC)   . Hypertension   . GERD (gastroesophageal reflux disease)   . High cholesterol   . Peripheral vascular disease (HCC)     LLE  . Pneumonia 1990's  . Shortness of breath 09/18/11    "@ rest; lying down; w/exertion"  . Lower GI bleed 09/18/2011    "clots and everything; not lately"  . Headache(784.0) 09/18/2011    "maybe twice/wk; pressure on the brain"  . Chronic lower back pain   . Gout   . Anxiety   . Kidney stones   . Myocardial infarction (HCC) 10/2005  . Childhood asthma   . Type I diabetes mellitus (HCC)   . Seizures (HCC) 09/18/2011    "blanks out on me"/wife's report    Medications:  Scheduled:  . allopurinol  100 mg Oral Daily  . aspirin EC  81 mg Oral Daily   . atorvastatin  80 mg Oral q1800  . bisacodyl  5 mg Oral Once  . chlorhexidine  60 mL Topical Once   And  . [START ON 08/05/2015] chlorhexidine  60 mL Topical Once  . [START ON 08/05/2015] chlorhexidine  15 mL Mouth/Throat Once  . [START ON 08/05/2015] diazepam  5 mg Oral Once  . [START ON 08/05/2015] metoprolol tartrate  12.5 mg Oral Once  . omega-3 acid ethyl esters  1 g Oral BID  . sodium chloride flush  3 mL Intravenous Q12H   Infusions:  . heparin 1,100 Units/hr (08/03/15 1655)  . nitroGLYCERIN 5 mcg/min (08/01/15 1102)   PRN: sodium chloride, acetaminophen, ALPRAZolam, morphine injection, nitroGLYCERIN, ondansetron (ZOFRAN) IV, sodium chloride flush, temazepam  Assessment: 50 y/o male presented with chest pain 7/12. Past medical history significant for CAD s/p multiple PCI procedures, HTN, DM, HLD, and hx of GI bleed in 2013. Pharmacy was  consulted to dose post-cath heparin 8 hours after sheath removed. planned for bypass grafting on Monday.  Heparin level is therapeutic today (0.6 units/ml). H&H and platelets wnl. No s/sx of bleeding noted.   Goal of Therapy Heparin level 0.3-0.7 units/ml Monitor platelets by anticoagulation protocol: Yes   Plan:  Continue Heparin 1100 IV units/hr. Continue to monitor heparin levels, H&H, and platelets daily. Continue to monitor for s/sx of bleeding.  Thank you for allowing [place] to participate in this patients care. Signe Colt, PharmD Pager: [phone]  08/04/2015

## 2015-08-04 NOTE — Anesthesia Preprocedure Evaluation (Addendum)
Anesthesia Evaluation  Patient identified by MRN, date of birth, ID band Patient awake    Reviewed: Allergy & Precautions, NPO status , Patient's Chart, lab work & pertinent test results  Airway Mallampati: II  TM Distance: >3 FB Neck ROM: Full    Dental  (+) Teeth Intact   Pulmonary former smoker,    breath sounds clear to auscultation       Cardiovascular hypertension,  Rhythm:Regular Rate:Normal  TTE 08/01/15: Study Conclusions  - Left ventricle: The cavity size was normal. Wall thickness was normal. Systolic function was mildly reduced. The estimated ejection fraction was in the range of 45% to 50%. Diffuse hypokinesis. Left ventricular diastolic function parameters were normal. - Aorta: Aortic root dimension: 38 mm (ED). - Ascending aorta: The ascending aorta was mildly dilated.   Neuro/Psych    GI/Hepatic   Endo/Other  diabetes  Renal/GU      Musculoskeletal   Abdominal   Peds  Hematology   Anesthesia Other Findings   Reproductive/Obstetrics                          Anesthesia Physical Anesthesia Plan  ASA: III  Anesthesia Plan: General   Post-op Pain Management:    Induction: Intravenous  Airway Management Planned: Oral ETT  Additional Equipment: CVP, PA Cath, 3D TEE and Arterial line  Intra-op Plan:   Post-operative Plan: Post-operative intubation/ventilation  Informed Consent: I have reviewed the patients History and Physical, chart, labs and discussed the procedure including the risks, benefits and alternatives for the proposed anesthesia with the patient or authorized representative who has indicated his/her understanding and acceptance.   Dental advisory given  Plan Discussed with: CRNA and Anesthesiologist  Anesthesia Plan Comments:        Anesthesia Quick Evaluation

## 2015-08-05 ENCOUNTER — Inpatient Hospital Stay (HOSPITAL_COMMUNITY): Payer: Self-pay

## 2015-08-05 ENCOUNTER — Encounter (HOSPITAL_COMMUNITY)
Admission: RE | Disposition: A | Discharge status: Home / Self Care | Payer: Self-pay | Source: Ambulatory Visit | Attending: Thoracic Surgery (Cardiothoracic Vascular Surgery)

## 2015-08-05 ENCOUNTER — Inpatient Hospital Stay (HOSPITAL_COMMUNITY): Payer: Self-pay | Admitting: Anesthesiology

## 2015-08-05 ENCOUNTER — Inpatient Hospital Stay (HOSPITAL_COMMUNITY): Payer: MEDICAID | Admitting: Anesthesiology

## 2015-08-05 DIAGNOSIS — Z951 Presence of aortocoronary bypass graft: Secondary | ICD-10-CM

## 2015-08-05 HISTORY — PX: RADIAL ARTERY HARVEST: SHX5067

## 2015-08-05 HISTORY — PX: TEE WITHOUT CARDIOVERSION: SHX5443

## 2015-08-05 HISTORY — PX: CORONARY ARTERY BYPASS GRAFT: SHX141

## 2015-08-05 LAB — CBC
HCT: 28.6 % — ABNORMAL LOW (ref 39.0–52.0)
HCT: 27.5 % — ABNORMAL LOW (ref 39.0–52.0)
Hemoglobin: 9.5 g/dL — ABNORMAL LOW (ref 13.0–17.0)
Hemoglobin: 9.3 g/dL — ABNORMAL LOW (ref 13.0–17.0)
MCH: 28.8 pg (ref 26.0–34.0)
MCH: 29.5 pg (ref 26.0–34.0)
MCHC: 33.2 g/dL (ref 30.0–36.0)
MCHC: 33.8 g/dL (ref 30.0–36.0)
MCV: 86.7 fL (ref 78.0–100.0)
MCV: 87.3 fL (ref 78.0–100.0)
PLATELETS: 122 10*3/uL — AB (ref 150–400)
PLATELETS: 151 10*3/uL (ref 150–400)
RBC: 3.3 MIL/uL — ABNORMAL LOW (ref 4.22–5.81)
RBC: 3.15 MIL/uL — AB (ref 4.22–5.81)
RDW: 14.3 % (ref 11.5–15.5)
RDW: 14.7 % (ref 11.5–15.5)
WBC: 7.6 10*3/uL (ref 4.0–10.5)
WBC: 12 10*3/uL — AB (ref 4.0–10.5)

## 2015-08-05 LAB — POCT I-STAT 3, ART BLOOD GAS (G3+)
ACID-BASE DEFICIT: 2 mmol/L (ref 0.0–2.0)
ACID-BASE DEFICIT: 2 mmol/L (ref 0.0–2.0)
ACID-BASE DEFICIT: 4 mmol/L — AB (ref 0.0–2.0)
ACID-BASE DEFICIT: 4 mmol/L — AB (ref 0.0–2.0)
ACID-BASE DEFICIT: 5 mmol/L — AB (ref 0.0–2.0)
Acid-base deficit: 1 mmol/L (ref 0.0–2.0)
Acid-base deficit: 5 mmol/L — ABNORMAL HIGH (ref 0.0–2.0)
BICARBONATE: 21.9 meq/L (ref 20.0–24.0)
BICARBONATE: 23.7 meq/L (ref 20.0–24.0)
BICARBONATE: 21.5 meq/L (ref 20.0–24.0)
BICARBONATE: 21 meq/L (ref 20.0–24.0)
Bicarbonate: 23.1 mEq/L (ref 20.0–24.0)
Bicarbonate: 20.7 mEq/L (ref 20.0–24.0)
Bicarbonate: 21.6 mEq/L (ref 20.0–24.0)
O2 SAT: 100 %
O2 SAT: 100 %
O2 SAT: 100 %
O2 SAT: 100 %
O2 SAT: 99 %
O2 Saturation: 100 %
O2 Saturation: 100 %
PCO2 ART: 31.4 mmHg — AB (ref 35.0–45.0)
PCO2 ART: 35.9 mmHg (ref 35.0–45.0)
PCO2 ART: 37.7 mmHg (ref 35.0–45.0)
PCO2 ART: 37.8 mmHg (ref 35.0–45.0)
PH ART: 7.408 (ref 7.350–7.450)
PH ART: 7.344 — AB (ref 7.350–7.450)
PH ART: 7.362 (ref 7.350–7.450)
PH ART: 7.317 — AB (ref 7.350–7.450)
PO2 ART: 522 mmHg — AB (ref 80.0–100.0)
PO2 ART: 175 mmHg — AB (ref 80.0–100.0)
PO2 ART: 179 mmHg — AB (ref 80.0–100.0)
Patient temperature: 35.2
Patient temperature: 36.4
TCO2: 23 mmol/L (ref 0–100)
TCO2: 25 mmol/L (ref 0–100)
TCO2: 24 mmol/L (ref 0–100)
TCO2: 23 mmol/L (ref 0–100)
TCO2: 22 mmol/L (ref 0–100)
TCO2: 23 mmol/L (ref 0–100)
TCO2: 22 mmol/L (ref 0–100)
pCO2 arterial: 37.6 mmHg (ref 35.0–45.0)
pCO2 arterial: 38.3 mmHg (ref 35.0–45.0)
pCO2 arterial: 40.9 mmHg (ref 35.0–45.0)
pH, Arterial: 7.452 — ABNORMAL HIGH (ref 7.350–7.450)
pH, Arterial: 7.389 (ref 7.350–7.450)
pH, Arterial: 7.377 (ref 7.350–7.450)
pO2, Arterial: 547 mmHg — ABNORMAL HIGH (ref 80.0–100.0)
pO2, Arterial: 423 mmHg — ABNORMAL HIGH (ref 80.0–100.0)
pO2, Arterial: 181 mmHg — ABNORMAL HIGH (ref 80.0–100.0)
pO2, Arterial: 170 mmHg — ABNORMAL HIGH (ref 80.0–100.0)

## 2015-08-05 LAB — POCT I-STAT, CHEM 8
BUN: 21 mg/dL — ABNORMAL HIGH (ref 6–20)
BUN: 17 mg/dL (ref 6–20)
BUN: 10 mg/dL (ref 6–20)
BUN: 19 mg/dL (ref 6–20)
BUN: 17 mg/dL (ref 6–20)
BUN: 17 mg/dL (ref 6–20)
BUN: 18 mg/dL (ref 6–20)
BUN: 17 mg/dL (ref 6–20)
CALCIUM ION: 1.28 mmol/L (ref 1.13–1.30)
CALCIUM ION: 1.23 mmol/L (ref 1.13–1.30)
CALCIUM ION: 1.06 mmol/L — AB (ref 1.13–1.30)
CALCIUM ION: 1.02 mmol/L — AB (ref 1.13–1.30)
CALCIUM ION: 1.19 mmol/L (ref 1.13–1.30)
CHLORIDE: 110 mmol/L (ref 101–111)
CHLORIDE: 106 mmol/L (ref 101–111)
CHLORIDE: 98 mmol/L — AB (ref 101–111)
CHLORIDE: 104 mmol/L (ref 101–111)
CHLORIDE: 97 mmol/L — AB (ref 101–111)
CHLORIDE: 102 mmol/L (ref 101–111)
CREATININE: 1.1 mg/dL (ref 0.61–1.24)
CREATININE: 0.4 mg/dL — AB (ref 0.61–1.24)
CREATININE: 0.8 mg/dL (ref 0.61–1.24)
Calcium, Ion: 1.22 mmol/L (ref 1.13–1.30)
Calcium, Ion: 0.69 mmol/L — CL (ref 1.13–1.30)
Calcium, Ion: 1.11 mmol/L — ABNORMAL LOW (ref 1.13–1.30)
Chloride: 106 mmol/L (ref 101–111)
Chloride: 105 mmol/L (ref 101–111)
Creatinine, Ser: 0.9 mg/dL (ref 0.61–1.24)
Creatinine, Ser: 0.8 mg/dL (ref 0.61–1.24)
Creatinine, Ser: 0.8 mg/dL (ref 0.61–1.24)
Creatinine, Ser: 1.1 mg/dL (ref 0.61–1.24)
Creatinine, Ser: 1.3 mg/dL — ABNORMAL HIGH (ref 0.61–1.24)
GLUCOSE: 110 mg/dL — AB (ref 65–99)
GLUCOSE: 177 mg/dL — AB (ref 65–99)
GLUCOSE: 187 mg/dL — AB (ref 65–99)
GLUCOSE: 144 mg/dL — AB (ref 65–99)
GLUCOSE: 156 mg/dL — AB (ref 65–99)
Glucose, Bld: 103 mg/dL — ABNORMAL HIGH (ref 65–99)
Glucose, Bld: 102 mg/dL — ABNORMAL HIGH (ref 65–99)
Glucose, Bld: 65 mg/dL (ref 65–99)
HCT: 37 % — ABNORMAL LOW (ref 39.0–52.0)
HCT: 26 % — ABNORMAL LOW (ref 39.0–52.0)
HCT: 23 % — ABNORMAL LOW (ref 39.0–52.0)
HCT: 28 % — ABNORMAL LOW (ref 39.0–52.0)
HEMATOCRIT: 34 % — AB (ref 39.0–52.0)
HEMATOCRIT: 21 % — AB (ref 39.0–52.0)
HEMATOCRIT: 33 % — AB (ref 39.0–52.0)
HEMATOCRIT: 28 % — AB (ref 39.0–52.0)
HEMOGLOBIN: 12.6 g/dL — AB (ref 13.0–17.0)
HEMOGLOBIN: 11.6 g/dL — AB (ref 13.0–17.0)
HEMOGLOBIN: 8.8 g/dL — AB (ref 13.0–17.0)
HEMOGLOBIN: 9.5 g/dL — AB (ref 13.0–17.0)
Hemoglobin: 11.2 g/dL — ABNORMAL LOW (ref 13.0–17.0)
Hemoglobin: 7.1 g/dL — ABNORMAL LOW (ref 13.0–17.0)
Hemoglobin: 7.8 g/dL — ABNORMAL LOW (ref 13.0–17.0)
Hemoglobin: 9.5 g/dL — ABNORMAL LOW (ref 13.0–17.0)
POTASSIUM: 5 mmol/L (ref 3.5–5.1)
POTASSIUM: 4.8 mmol/L (ref 3.5–5.1)
POTASSIUM: 5.1 mmol/L (ref 3.5–5.1)
POTASSIUM: 5 mmol/L (ref 3.5–5.1)
POTASSIUM: 5.4 mmol/L — AB (ref 3.5–5.1)
Potassium: 4.5 mmol/L (ref 3.5–5.1)
Potassium: 6.1 mmol/L — ABNORMAL HIGH (ref 3.5–5.1)
Potassium: 4.5 mmol/L (ref 3.5–5.1)
SODIUM: 136 mmol/L (ref 135–145)
SODIUM: 137 mmol/L (ref 135–145)
SODIUM: 139 mmol/L (ref 135–145)
SODIUM: 135 mmol/L (ref 135–145)
SODIUM: 137 mmol/L (ref 135–145)
Sodium: 140 mmol/L (ref 135–145)
Sodium: 135 mmol/L (ref 135–145)
Sodium: 130 mmol/L — ABNORMAL LOW (ref 135–145)
TCO2: 27 mmol/L (ref 0–100)
TCO2: 29 mmol/L (ref 0–100)
TCO2: 25 mmol/L (ref 0–100)
TCO2: 27 mmol/L (ref 0–100)
TCO2: 25 mmol/L (ref 0–100)
TCO2: 24 mmol/L (ref 0–100)
TCO2: 23 mmol/L (ref 0–100)
TCO2: 21 mmol/L (ref 0–100)

## 2015-08-05 LAB — APTT: APTT: 29 s (ref 24–37)

## 2015-08-05 LAB — GLUCOSE, CAPILLARY
GLUCOSE-CAPILLARY: 130 mg/dL — AB (ref 65–99)
GLUCOSE-CAPILLARY: 162 mg/dL — AB (ref 65–99)
Glucose-Capillary: 130 mg/dL — ABNORMAL HIGH (ref 65–99)
Glucose-Capillary: 100 mg/dL — ABNORMAL HIGH (ref 65–99)
Glucose-Capillary: 109 mg/dL — ABNORMAL HIGH (ref 65–99)
Glucose-Capillary: 122 mg/dL — ABNORMAL HIGH (ref 65–99)
Glucose-Capillary: 149 mg/dL — ABNORMAL HIGH (ref 65–99)
Glucose-Capillary: 135 mg/dL — ABNORMAL HIGH (ref 65–99)

## 2015-08-05 LAB — PREPARE RBC (CROSSMATCH)

## 2015-08-05 LAB — PROTIME-INR
INR: 1.56 — AB (ref 0.00–1.49)
PROTHROMBIN TIME: 18.7 s — AB (ref 11.6–15.2)

## 2015-08-05 LAB — CREATININE, SERUM
CREATININE: 1.44 mg/dL — AB (ref 0.61–1.24)
GFR, EST NON AFRICAN AMERICAN: 54 mL/min — AB (ref >60)

## 2015-08-05 LAB — HEMOGLOBIN AND HEMATOCRIT, BLOOD
HEMATOCRIT: 23.7 % — AB (ref 39.0–52.0)
HEMOGLOBIN: 8 g/dL — AB (ref 13.0–17.0)

## 2015-08-05 LAB — HEMOGLOBIN A1C
HEMOGLOBIN A1C: 5.9 % — AB (ref 4.8–5.6)
Mean Plasma Glucose: 123 mg/dL

## 2015-08-05 LAB — MAGNESIUM: MAGNESIUM: 3.1 mg/dL — AB (ref 1.7–2.4)

## 2015-08-05 LAB — PLATELET COUNT: PLATELETS: 148 10*3/uL — AB (ref 150–400)

## 2015-08-05 SURGERY — CORONARY ARTERY BYPASS GRAFTING (CABG)
Anesthesia: General | Site: Chest

## 2015-08-05 MED ORDER — FENTANYL CITRATE (PF) 250 MCG/5ML IJ SOLN
INTRAMUSCULAR | Status: AC
  Filled 2015-08-05: qty 5

## 2015-08-05 MED ORDER — DEXTROSE 50 % IV SOLN
INTRAVENOUS | Status: DC | PRN
  Administered 2015-08-05: 5 mL via INTRAVENOUS

## 2015-08-05 MED ORDER — TRAMADOL HCL 50 MG PO TABS
50.0000 mg | ORAL_TABLET | ORAL | Status: DC | PRN
  Administered 2015-08-06 – 2015-08-09 (×3): 100 mg via ORAL
  Filled 2015-08-05 (×3): qty 2

## 2015-08-05 MED ORDER — ANTISEPTIC ORAL RINSE SOLUTION (CORINZ)
7.0000 mL | Freq: Four times a day (QID) | OROMUCOSAL | Status: DC
  Administered 2015-08-06 – 2015-08-07 (×5): 7 mL via OROMUCOSAL

## 2015-08-05 MED ORDER — METOPROLOL TARTRATE 12.5 MG HALF TABLET
12.5000 mg | ORAL_TABLET | Freq: Two times a day (BID) | ORAL | Status: DC
  Administered 2015-08-06 – 2015-08-08 (×5): 12.5 mg via ORAL
  Filled 2015-08-05 (×5): qty 1

## 2015-08-05 MED ORDER — HEPARIN SODIUM (PORCINE) 1000 UNIT/ML IJ SOLN
INTRAMUSCULAR | Status: DC | PRN
  Administered 2015-08-05: 2000 [IU] via INTRAVENOUS
  Administered 2015-08-05: 43000 [IU] via INTRAVENOUS

## 2015-08-05 MED ORDER — HEMOSTATIC AGENTS (NO CHARGE) OPTIME
TOPICAL | Status: DC | PRN
  Administered 2015-08-05: 1 via TOPICAL

## 2015-08-05 MED ORDER — DOCUSATE SODIUM 100 MG PO CAPS
200.0000 mg | ORAL_CAPSULE | Freq: Every day | ORAL | Status: DC
  Administered 2015-08-06 – 2015-08-10 (×5): 200 mg via ORAL
  Filled 2015-08-05 (×6): qty 2

## 2015-08-05 MED ORDER — PHENYLEPHRINE HCL 10 MG/ML IJ SOLN
0.0000 ug/min | INTRAMUSCULAR | Status: DC
  Filled 2015-08-05: qty 2

## 2015-08-05 MED ORDER — OXYCODONE HCL 5 MG PO TABS
5.0000 mg | ORAL_TABLET | ORAL | Status: DC | PRN
  Administered 2015-08-06 – 2015-08-10 (×21): 10 mg via ORAL
  Filled 2015-08-05 (×21): qty 2

## 2015-08-05 MED ORDER — CHLORHEXIDINE GLUCONATE CLOTH 2 % EX PADS
6.0000 | MEDICATED_PAD | Freq: Every day | CUTANEOUS | Status: AC
  Administered 2015-08-05 – 2015-08-09 (×5): 6 via TOPICAL

## 2015-08-05 MED ORDER — DEXMEDETOMIDINE HCL IN NACL 200 MCG/50ML IV SOLN
0.0000 ug/kg/h | INTRAVENOUS | Status: DC
  Filled 2015-08-05: qty 50

## 2015-08-05 MED ORDER — ACETAMINOPHEN 650 MG RE SUPP
650.0000 mg | Freq: Once | RECTAL | Status: AC
  Administered 2015-08-05: 650 mg via RECTAL

## 2015-08-05 MED ORDER — SODIUM CHLORIDE 0.9% FLUSH
3.0000 mL | Freq: Two times a day (BID) | INTRAVENOUS | Status: DC
  Administered 2015-08-06: 3 mL via INTRAVENOUS

## 2015-08-05 MED ORDER — PANTOPRAZOLE SODIUM 40 MG PO TBEC
40.0000 mg | DELAYED_RELEASE_TABLET | Freq: Every day | ORAL | Status: DC
  Administered 2015-08-06 – 2015-08-10 (×5): 40 mg via ORAL
  Filled 2015-08-05 (×5): qty 1

## 2015-08-05 MED ORDER — FENTANYL CITRATE (PF) 250 MCG/5ML IJ SOLN
INTRAMUSCULAR | Status: AC
  Filled 2015-08-05: qty 25

## 2015-08-05 MED ORDER — LACTATED RINGERS IV SOLN: INTRAVENOUS | Status: DC

## 2015-08-05 MED ORDER — SODIUM CHLORIDE 0.9 % IJ SOLN
OROMUCOSAL | Status: DC | PRN
  Administered 2015-08-05 (×3): 4 mL via TOPICAL

## 2015-08-05 MED ORDER — ACETAMINOPHEN 160 MG/5ML PO SOLN: 1000.0000 mg | Freq: Four times a day (QID) | ORAL | Status: DC

## 2015-08-05 MED ORDER — ROCURONIUM BROMIDE 50 MG/5ML IV SOLN
INTRAVENOUS | Status: AC
  Filled 2015-08-05: qty 3

## 2015-08-05 MED ORDER — CHLORHEXIDINE GLUCONATE 0.12 % MT SOLN
15.0000 mL | OROMUCOSAL | Status: AC
  Administered 2015-08-05: 15 mL via OROMUCOSAL

## 2015-08-05 MED ORDER — ONDANSETRON HCL 4 MG/2ML IJ SOLN
4.0000 mg | Freq: Four times a day (QID) | INTRAMUSCULAR | Status: DC | PRN
  Administered 2015-08-05: 4 mg via INTRAVENOUS
  Filled 2015-08-05: qty 2

## 2015-08-05 MED ORDER — SODIUM CHLORIDE 0.9% FLUSH: 3.0000 mL | INTRAVENOUS | Status: DC | PRN

## 2015-08-05 MED ORDER — NITROGLYCERIN IN D5W 200-5 MCG/ML-% IV SOLN
0.0000 ug/min | INTRAVENOUS | Status: AC
  Filled 2015-08-05: qty 250

## 2015-08-05 MED ORDER — ROCURONIUM BROMIDE 50 MG/5ML IV SOLN
INTRAVENOUS | Status: AC
  Filled 2015-08-05: qty 2

## 2015-08-05 MED ORDER — LACTATED RINGERS IV SOLN
INTRAVENOUS | Status: DC | PRN
  Administered 2015-08-05 (×2): via INTRAVENOUS

## 2015-08-05 MED ORDER — LACTATED RINGERS IV SOLN: 500.0000 mL | Freq: Once | INTRAVENOUS | Status: DC | PRN

## 2015-08-05 MED ORDER — MUPIROCIN 2 % EX OINT
1.0000 "application " | TOPICAL_OINTMENT | Freq: Two times a day (BID) | CUTANEOUS | Status: DC
  Administered 2015-08-05 – 2015-08-09 (×9): 1 via NASAL
  Filled 2015-08-05 (×4): qty 22

## 2015-08-05 MED ORDER — ALBUMIN HUMAN 5 % IV SOLN
250.0000 mL | INTRAVENOUS | Status: AC | PRN
Start: 2015-08-05 — End: 2015-08-06
  Administered 2015-08-05 (×3): 250 mL via INTRAVENOUS
  Filled 2015-08-05: qty 250

## 2015-08-05 MED ORDER — HEPARIN SODIUM (PORCINE) 1000 UNIT/ML IJ SOLN
INTRAMUSCULAR | Status: AC
  Filled 2015-08-05: qty 1

## 2015-08-05 MED ORDER — PROTAMINE SULFATE 10 MG/ML IV SOLN
INTRAVENOUS | Status: DC | PRN
  Administered 2015-08-05: 380 mg via INTRAVENOUS

## 2015-08-05 MED ORDER — SODIUM CHLORIDE 0.9 % IV SOLN: INTRAVENOUS | Status: DC

## 2015-08-05 MED ORDER — METOPROLOL TARTRATE 5 MG/5ML IV SOLN
2.5000 mg | INTRAVENOUS | Status: DC | PRN
  Administered 2015-08-06: 5 mg via INTRAVENOUS

## 2015-08-05 MED ORDER — MIDAZOLAM HCL 10 MG/2ML IJ SOLN
INTRAMUSCULAR | Status: AC
  Filled 2015-08-05: qty 2

## 2015-08-05 MED ORDER — ISOSORBIDE MONONITRATE 15 MG HALF TABLET
15.0000 mg | ORAL_TABLET | Freq: Every day | ORAL | Status: DC
  Filled 2015-08-05: qty 1

## 2015-08-05 MED ORDER — MORPHINE SULFATE (PF) 2 MG/ML IV SOLN
1.0000 mg | INTRAVENOUS | Status: AC | PRN
  Administered 2015-08-05: 2 mg via INTRAVENOUS
  Administered 2015-08-05 (×2): 1 mg via INTRAVENOUS
  Filled 2015-08-05: qty 1

## 2015-08-05 MED ORDER — FAMOTIDINE IN NACL 20-0.9 MG/50ML-% IV SOLN
20.0000 mg | Freq: Two times a day (BID) | INTRAVENOUS | Status: AC
  Administered 2015-08-05: 20 mg via INTRAVENOUS

## 2015-08-05 MED ORDER — METOCLOPRAMIDE HCL 5 MG/ML IJ SOLN
10.0000 mg | Freq: Four times a day (QID) | INTRAMUSCULAR | Status: AC
  Administered 2015-08-05 – 2015-08-06 (×4): 10 mg via INTRAVENOUS
  Filled 2015-08-05 (×4): qty 2

## 2015-08-05 MED ORDER — PHENYLEPHRINE 40 MCG/ML (10ML) SYRINGE FOR IV PUSH (FOR BLOOD PRESSURE SUPPORT)
PREFILLED_SYRINGE | INTRAVENOUS | Status: AC
  Filled 2015-08-05: qty 30

## 2015-08-05 MED ORDER — POTASSIUM CHLORIDE 10 MEQ/50ML IV SOLN: 10.0000 meq | INTRAVENOUS | Status: DC

## 2015-08-05 MED ORDER — MIDAZOLAM HCL 2 MG/2ML IJ SOLN: 2.0000 mg | INTRAMUSCULAR | Status: DC | PRN

## 2015-08-05 MED ORDER — SODIUM CHLORIDE 0.45 % IV SOLN
INTRAVENOUS | Status: DC | PRN
  Administered 2015-08-05: 14:00:00 via INTRAVENOUS

## 2015-08-05 MED ORDER — PROPOFOL 10 MG/ML IV BOLUS
INTRAVENOUS | Status: DC | PRN
  Administered 2015-08-05: 50 mg via INTRAVENOUS

## 2015-08-05 MED ORDER — ASPIRIN EC 325 MG PO TBEC
325.0000 mg | DELAYED_RELEASE_TABLET | Freq: Every day | ORAL | Status: DC
  Administered 2015-08-06 – 2015-08-10 (×4): 325 mg via ORAL
  Filled 2015-08-05 (×4): qty 1

## 2015-08-05 MED ORDER — LACTATED RINGERS IV SOLN
INTRAVENOUS | Status: DC | PRN
  Administered 2015-08-05 (×2): via INTRAVENOUS

## 2015-08-05 MED ORDER — LIDOCAINE 2% (20 MG/ML) 5 ML SYRINGE
INTRAMUSCULAR | Status: AC
  Filled 2015-08-05: qty 5

## 2015-08-05 MED ORDER — LACTATED RINGERS IV SOLN
INTRAVENOUS | Status: DC | PRN
  Administered 2015-08-05: 07:00:00 via INTRAVENOUS

## 2015-08-05 MED ORDER — PHENYLEPHRINE HCL 10 MG/ML IJ SOLN
10.0000 mg | INTRAVENOUS | Status: DC | PRN
  Administered 2015-08-05: 20 ug/min via INTRAVENOUS

## 2015-08-05 MED ORDER — INSULIN REGULAR BOLUS VIA INFUSION
0.0000 [IU] | Freq: Three times a day (TID) | INTRAVENOUS | Status: DC
  Filled 2015-08-05: qty 10

## 2015-08-05 MED ORDER — SODIUM CHLORIDE 0.9 % IV SOLN
250.0000 mL | INTRAVENOUS | Status: DC
  Administered 2015-08-06: 250 mL via INTRAVENOUS

## 2015-08-05 MED ORDER — CHLORHEXIDINE GLUCONATE 0.12% ORAL RINSE (MEDLINE KIT)
15.0000 mL | Freq: Two times a day (BID) | OROMUCOSAL | Status: DC
  Administered 2015-08-05 – 2015-08-06 (×3): 15 mL via OROMUCOSAL

## 2015-08-05 MED ORDER — DEXTROSE 5 % IV SOLN
1.5000 g | Freq: Two times a day (BID) | INTRAVENOUS | Status: AC
  Administered 2015-08-05 – 2015-08-07 (×4): 1.5 g via INTRAVENOUS
  Filled 2015-08-05 (×4): qty 1.5

## 2015-08-05 MED ORDER — MAGNESIUM SULFATE 4 GM/100ML IV SOLN
4.0000 g | Freq: Once | INTRAVENOUS | Status: AC
  Administered 2015-08-05: 4 g via INTRAVENOUS
  Filled 2015-08-05: qty 100

## 2015-08-05 MED ORDER — MORPHINE SULFATE (PF) 2 MG/ML IV SOLN
2.0000 mg | INTRAVENOUS | Status: DC | PRN
  Administered 2015-08-05: 2 mg via INTRAVENOUS
  Administered 2015-08-06 (×2): 4 mg via INTRAVENOUS
  Filled 2015-08-05: qty 2
  Filled 2015-08-05: qty 1
  Filled 2015-08-05: qty 2
  Filled 2015-08-05: qty 1

## 2015-08-05 MED ORDER — ROCURONIUM BROMIDE 10 MG/ML (PF) SYRINGE
PREFILLED_SYRINGE | INTRAVENOUS | Status: DC | PRN
  Administered 2015-08-05 (×2): 50 mg via INTRAVENOUS
  Administered 2015-08-05: 40 mg via INTRAVENOUS
  Administered 2015-08-05: 60 mg via INTRAVENOUS

## 2015-08-05 MED ORDER — 0.9 % SODIUM CHLORIDE (POUR BTL) OPTIME
TOPICAL | Status: DC | PRN
  Administered 2015-08-05: 6000 mL

## 2015-08-05 MED ORDER — ASPIRIN 81 MG PO CHEW
324.0000 mg | CHEWABLE_TABLET | Freq: Every day | ORAL | Status: DC
  Administered 2015-08-07: 324 mg
  Filled 2015-08-05: qty 4

## 2015-08-05 MED ORDER — ACETAMINOPHEN 160 MG/5ML PO SOLN: 650.0000 mg | Freq: Once | ORAL | Status: AC

## 2015-08-05 MED ORDER — METOPROLOL TARTRATE 25 MG/10 ML ORAL SUSPENSION
12.5000 mg | Freq: Two times a day (BID) | ORAL | Status: DC
  Administered 2015-08-07: 12.5 mg
  Filled 2015-08-05: qty 5

## 2015-08-05 MED ORDER — DEXTROSE 50 % IV SOLN
INTRAVENOUS | Status: AC
  Filled 2015-08-05: qty 50

## 2015-08-05 MED ORDER — BISACODYL 5 MG PO TBEC
10.0000 mg | DELAYED_RELEASE_TABLET | Freq: Every day | ORAL | Status: DC
  Administered 2015-08-06: 10 mg via ORAL
  Filled 2015-08-05: qty 2

## 2015-08-05 MED ORDER — VANCOMYCIN HCL IN DEXTROSE 1-5 GM/200ML-% IV SOLN
1000.0000 mg | Freq: Once | INTRAVENOUS | Status: AC
  Administered 2015-08-05: 1000 mg via INTRAVENOUS
  Filled 2015-08-05: qty 200

## 2015-08-05 MED ORDER — ALBUMIN HUMAN 5 % IV SOLN
INTRAVENOUS | Status: DC | PRN
  Administered 2015-08-05: 13:00:00 via INTRAVENOUS

## 2015-08-05 MED ORDER — FENTANYL CITRATE (PF) 100 MCG/2ML IJ SOLN
INTRAMUSCULAR | Status: DC | PRN
  Administered 2015-08-05: 50 ug via INTRAVENOUS
  Administered 2015-08-05 (×2): 150 ug via INTRAVENOUS
  Administered 2015-08-05 (×2): 100 ug via INTRAVENOUS
  Administered 2015-08-05: 400 ug via INTRAVENOUS
  Administered 2015-08-05 (×2): 100 ug via INTRAVENOUS
  Administered 2015-08-05: 250 ug via INTRAVENOUS
  Administered 2015-08-05: 100 ug via INTRAVENOUS

## 2015-08-05 MED ORDER — BISACODYL 10 MG RE SUPP: 10.0000 mg | Freq: Every day | RECTAL | Status: DC

## 2015-08-05 MED ORDER — PROPOFOL 10 MG/ML IV BOLUS
INTRAVENOUS | Status: AC
  Filled 2015-08-05: qty 20

## 2015-08-05 MED ORDER — ACETAMINOPHEN 500 MG PO TABS
1000.0000 mg | ORAL_TABLET | Freq: Four times a day (QID) | ORAL | Status: DC
  Administered 2015-08-05 – 2015-08-10 (×15): 1000 mg via ORAL
  Filled 2015-08-05 (×13): qty 2

## 2015-08-05 MED ORDER — MIDAZOLAM HCL 5 MG/5ML IJ SOLN
INTRAMUSCULAR | Status: DC | PRN
  Administered 2015-08-05: 1 mg via INTRAVENOUS
  Administered 2015-08-05 (×2): 2 mg via INTRAVENOUS
  Administered 2015-08-05: 5 mg via INTRAVENOUS

## 2015-08-05 MED ORDER — SODIUM CHLORIDE 0.9 % IV SOLN
INTRAVENOUS | Status: DC
  Administered 2015-08-05: 1.5 [IU]/h via INTRAVENOUS
  Filled 2015-08-05 (×2): qty 2.5

## 2015-08-05 MED FILL — Magnesium Sulfate Inj 50%: INTRAMUSCULAR | Qty: 10 | Status: AC

## 2015-08-05 MED FILL — Heparin Sodium (Porcine) Inj 1000 Unit/ML: INTRAMUSCULAR | Qty: 30 | Status: AC

## 2015-08-05 MED FILL — Potassium Chloride Inj 2 mEq/ML: INTRAVENOUS | Qty: 40 | Status: AC

## 2015-08-05 SURGICAL SUPPLY — 104 items
ADH SKN CLS APL DERMABOND .7 (GAUZE/BANDAGES/DRESSINGS) ×3
APPLIER CLIP 9.375 SM OPEN (CLIP)
APR CLP SM 9.3 20 MLT OPN (CLIP)
BAG DECANTER FOR FLEXI CONT (MISCELLANEOUS) ×5 IMPLANT
BANDAGE ELASTIC 4 VELCRO ST LF (GAUZE/BANDAGES/DRESSINGS) ×7 IMPLANT
BANDAGE ELASTIC 6 VELCRO ST LF (GAUZE/BANDAGES/DRESSINGS) ×5 IMPLANT
BASKET HEART  (ORDER IN 25'S) (MISCELLANEOUS) ×1
BASKET HEART (ORDER IN 25'S) (MISCELLANEOUS) ×1
BASKET HEART (ORDER IN 25S) (MISCELLANEOUS) ×3 IMPLANT
BLADE STERNUM SYSTEM 6 (BLADE) ×5 IMPLANT
BLADE SURG 11 STRL SS (BLADE) ×2 IMPLANT
BLADE SURG 15 STRL LF DISP TIS (BLADE) IMPLANT
BLADE SURG 15 STRL SS (BLADE) ×5
BNDG GAUZE ELAST 4 BULKY (GAUZE/BANDAGES/DRESSINGS) ×7 IMPLANT
CANISTER SUCTION 2500CC (MISCELLANEOUS) ×5 IMPLANT
CANNULA EZ GLIDE AORTIC 21FR (CANNULA) ×5 IMPLANT
CATH CPB KIT HENDRICKSON (MISCELLANEOUS) ×5 IMPLANT
CATH ROBINSON RED A/P 18FR (CATHETERS) ×5 IMPLANT
CATH THORACIC 36FR (CATHETERS) ×5 IMPLANT
CATH THORACIC 36FR RT ANG (CATHETERS) ×5 IMPLANT
CLIP APPLIE 9.375 SM OPEN (CLIP) IMPLANT
CLIP FOGARTY SPRING 6M (CLIP) ×2 IMPLANT
CLIP TI MEDIUM 24 (CLIP) ×2 IMPLANT
CLIP TI WIDE RED SMALL 24 (CLIP) ×8 IMPLANT
COVER MAYO STAND STRL (DRAPES) IMPLANT
CRADLE DONUT ADULT HEAD (MISCELLANEOUS) ×5 IMPLANT
DERMABOND ADVANCED (GAUZE/BANDAGES/DRESSINGS) ×2
DERMABOND ADVANCED .7 DNX12 (GAUZE/BANDAGES/DRESSINGS) IMPLANT
DRAPE CARDIOVASCULAR INCISE (DRAPES) ×5
DRAPE EXTREMITY T 121X128X90 (DRAPE) ×2 IMPLANT
DRAPE PROXIMA HALF (DRAPES) ×2 IMPLANT
DRAPE SLUSH/WARMER DISC (DRAPES) ×5 IMPLANT
DRAPE SRG 135X102X78XABS (DRAPES) ×3 IMPLANT
DRSG COVADERM 4X14 (GAUZE/BANDAGES/DRESSINGS) ×5 IMPLANT
ELECT REM PT RETURN 9FT ADLT (ELECTROSURGICAL) ×10
ELECTRODE REM PT RTRN 9FT ADLT (ELECTROSURGICAL) ×6 IMPLANT
FELT TEFLON 1X6 (MISCELLANEOUS) ×5 IMPLANT
GAUZE SPONGE 4X4 12PLY STRL (GAUZE/BANDAGES/DRESSINGS) ×12 IMPLANT
GEL ULTRASOUND 20GR AQUASONIC (MISCELLANEOUS) IMPLANT
GLOVE BIO SURGEON STRL SZ7 (GLOVE) ×2 IMPLANT
GLOVE BIOGEL PI IND STRL 6 (GLOVE) IMPLANT
GLOVE BIOGEL PI IND STRL 6.5 (GLOVE) IMPLANT
GLOVE BIOGEL PI INDICATOR 6 (GLOVE) ×2
GLOVE BIOGEL PI INDICATOR 6.5 (GLOVE) ×2
GLOVE SURG SIGNA 7.5 PF LTX (GLOVE) ×15 IMPLANT
GOWN STRL REUS W/ TWL LRG LVL3 (GOWN DISPOSABLE) ×12 IMPLANT
GOWN STRL REUS W/ TWL XL LVL3 (GOWN DISPOSABLE) ×6 IMPLANT
GOWN STRL REUS W/TWL LRG LVL3 (GOWN DISPOSABLE) ×20
GOWN STRL REUS W/TWL XL LVL3 (GOWN DISPOSABLE) ×10
HARMONIC SHEARS 14CM COAG (MISCELLANEOUS) ×5 IMPLANT
HEMOSTAT POWDER SURGIFOAM 1G (HEMOSTASIS) ×15 IMPLANT
HEMOSTAT SURGICEL 2X14 (HEMOSTASIS) ×5 IMPLANT
INSERT FOGARTY XLG (MISCELLANEOUS) IMPLANT
KIT BASIN OR (CUSTOM PROCEDURE TRAY) ×5 IMPLANT
KIT ROOM TURNOVER OR (KITS) ×5 IMPLANT
KIT SUCTION CATH 14FR (SUCTIONS) ×10 IMPLANT
KIT VASOVIEW 6 PRO VH 2400 (KITS) ×5 IMPLANT
MARKER GRAFT CORONARY BYPASS (MISCELLANEOUS) ×19 IMPLANT
NS IRRIG 1000ML POUR BTL (IV SOLUTION) ×25 IMPLANT
PACK OPEN HEART (CUSTOM PROCEDURE TRAY) ×5 IMPLANT
PAD ARMBOARD 7.5X6 YLW CONV (MISCELLANEOUS) ×10 IMPLANT
PAD ELECT DEFIB RADIOL ZOLL (MISCELLANEOUS) ×5 IMPLANT
PENCIL BUTTON HOLSTER BLD 10FT (ELECTRODE) ×5 IMPLANT
PUNCH AORTIC ROT 4.0MM RCL 40 (MISCELLANEOUS) ×2 IMPLANT
PUNCH AORTIC ROTATE 4.0MM (MISCELLANEOUS) IMPLANT
PUNCH AORTIC ROTATE 4.5MM 8IN (MISCELLANEOUS) IMPLANT
PUNCH AORTIC ROTATE 5MM 8IN (MISCELLANEOUS) IMPLANT
SET CARDIOPLEGIA MPS 5001102 (MISCELLANEOUS) ×2 IMPLANT
SOLUTION ANTI FOG 6CC (MISCELLANEOUS) ×2 IMPLANT
SPONGE LAP 18X18 X RAY DECT (DISPOSABLE) ×2 IMPLANT
SUT BONE WAX W31G (SUTURE) ×5 IMPLANT
SUT MNCRL AB 3-0 PS2 18 (SUTURE) ×2 IMPLANT
SUT MNCRL AB 4-0 PS2 18 (SUTURE) IMPLANT
SUT PROLENE 3 0 SH DA (SUTURE) ×7 IMPLANT
SUT PROLENE 4 0 RB 1 (SUTURE) ×10
SUT PROLENE 4 0 SH DA (SUTURE) ×2 IMPLANT
SUT PROLENE 4-0 RB1 .5 CRCL 36 (SUTURE) IMPLANT
SUT PROLENE 6 0 C 1 30 (SUTURE) ×12 IMPLANT
SUT PROLENE 7 0 BV1 MDA (SUTURE) ×7 IMPLANT
SUT PROLENE 8 0 BV175 6 (SUTURE) ×4 IMPLANT
SUT SILK 3 0 REEL (SUTURE) ×2 IMPLANT
SUT SILK 4 0 REEL (SUTURE) ×2 IMPLANT
SUT STEEL 6MS V (SUTURE) ×7 IMPLANT
SUT STEEL STERNAL CCS#1 18IN (SUTURE) IMPLANT
SUT STEEL SZ 6 DBL 3X14 BALL (SUTURE) ×7 IMPLANT
SUT VIC AB 1 CTX 36 (SUTURE) ×10
SUT VIC AB 1 CTX36XBRD ANBCTR (SUTURE) ×6 IMPLANT
SUT VIC AB 2-0 CT1 27 (SUTURE)
SUT VIC AB 2-0 CT1 TAPERPNT 27 (SUTURE) IMPLANT
SUT VIC AB 2-0 CTX 27 (SUTURE) IMPLANT
SUT VIC AB 3-0 SH 27 (SUTURE) ×10
SUT VIC AB 3-0 SH 27X BRD (SUTURE) IMPLANT
SUT VIC AB 3-0 X1 27 (SUTURE) ×2 IMPLANT
SUT VICRYL 4-0 PS2 18IN ABS (SUTURE) IMPLANT
SUTURE E-PAK OPEN HEART (SUTURE) ×5 IMPLANT
SYR 50ML SLIP (SYRINGE) IMPLANT
SYSTEM SAHARA CHEST DRAIN ATS (WOUND CARE) ×5 IMPLANT
TOWEL OR 17X24 6PK STRL BLUE (TOWEL DISPOSABLE) ×10 IMPLANT
TOWEL OR 17X26 10 PK STRL BLUE (TOWEL DISPOSABLE) ×10 IMPLANT
TRAY FOLEY IC TEMP SENS 16FR (CATHETERS) ×5 IMPLANT
TUBE FEEDING 8FR 16IN STR KANG (MISCELLANEOUS) ×5 IMPLANT
TUBING INSUFFLATION (TUBING) ×5 IMPLANT
UNDERPAD 30X30 INCONTINENT (UNDERPADS AND DIAPERS) ×5 IMPLANT
WATER STERILE IRR 1000ML POUR (IV SOLUTION) ×10 IMPLANT

## 2015-08-05 NOTE — Progress Notes (Signed)
Patient ID: Willie HansenDaryle W Tvedt, male   DOB: 02/13/1965, 50 y.o.   MRN: [id number]  SICU Evening Rounds:   Hemodynamically stable  CI = 2.2  Extubated  Urine output good  CT output low  CBC    Component Value Date/Time   WBC 7.6 08/05/2015 1405   RBC 3.30* 08/05/2015 1405   HGB 9.5* 08/05/2015 1410   HCT 28.0* 08/05/2015 1410   PLT 122* 08/05/2015 1405   MCV 86.7 08/05/2015 1405   MCH 28.8 08/05/2015 1405   MCHC 33.2 08/05/2015 1405   RDW 14.3 08/05/2015 1405   LYMPHSABS 3480 07/31/2015 1051   MONOABS 660 07/31/2015 1051   EOSABS 300 07/31/2015 1051   BASOSABS 60 07/31/2015 1051     BMET    Component Value Date/Time   NA 135 08/05/2015 1410   K 5.4* 08/05/2015 1410   CL 102 08/05/2015 1410   CO2 24 08/02/2015 0448   GLUCOSE 144* 08/05/2015 1410   BUN 18 08/05/2015 1410   CREATININE 1.10 08/05/2015 1410   CREATININE 1.65* 07/31/2015 1051   CALCIUM 9.1 08/02/2015 0448   GFRNONAA 59* 08/02/2015 0448   GFRNONAA 60 07/25/2015 1551   GFRAA >60 08/02/2015 0448   GFRAA 70 07/25/2015 1551     A/P:  Stable postop course. Continue current plans

## 2015-08-05 NOTE — Transfer of Care (Signed)
Immediate Anesthesia Transfer of Care Note  Patient: Willie Walker  Procedure(s) Performed: Procedure(s): CORONARY ARTERY BYPASS GRAFTING (CABG), ON PUMP, TIMES FIVE, USING LEFT INTERNAL MAMMARY ARTERY AND LEFT RADIAL ARTERY, RIGHT GREATER SAPHENOUS VEIN HARVESTED ENDOSCOPICALLY (N/A) RADIAL ARTERY HARVEST (Left) TRANSESOPHAGEAL ECHOCARDIOGRAM (TEE) (N/A)  Patient Location: ICU  Anesthesia Type:General  Level of Consciousness: Patient remains intubated per anesthesia plan  Airway & Oxygen Therapy: Patient remains intubated per anesthesia plan and Patient placed on Ventilator (see vital sign flow sheet for setting)  Post-op Assessment: Report given to RN and Post -op Vital signs reviewed and stable  Post vital signs: Reviewed and stable  Last Vitals:  Filed Vitals:   08/04/15 2341 08/05/15 0522  BP: 111/80 123/87  Pulse: 78 61  Temp: 36.8 C 36.4 C  Resp: 18     Last Pain:  Filed Vitals:   08/05/15 0526  PainSc: 0-No pain      Patients Stated Pain Goal: 0 (08/02/15 0800)  Complications: No apparent anesthesia complications

## 2015-08-05 NOTE — Procedures (Signed)
Extubation Procedure Note  Patient Details:   Name: Willie HansenDaryle W Dill DOB: 01/28/1965 MRN: [id number]   Airway Documentation:     Evaluation  O2 sats: stable throughout Complications: No apparent complications Patient did tolerate procedure well. Bilateral Breath Sounds: Clear, Diminished   Yes   Patient extubated without any complications and was placed on 4LNC. All vitals are stable at this time. Patient has adequate cough and is able to speak.  Sharene SkeansSilva, Urijah Arko C 08/05/2015, 8:08 PM

## 2015-08-05 NOTE — OR Nursing (Signed)
Forty-five minute call to SICU at 1250. Spoke with Tyler Aasoris.

## 2015-08-05 NOTE — OR Nursing (Signed)
Twenty minute call to SICU charge nurse at 1316. Spoke with PACCAR IncChristine.

## 2015-08-05 NOTE — Interval H&P Note (Signed)
History and Physical Interval Note:  08/05/2015 7:13 AM  Willie Walker  has presented today for surgery, with the diagnosis of CAD  The various methods of treatment have been discussed with the patient and family. After consideration of risks, benefits and other options for treatment, the patient has consented to  Procedure(s): CORONARY ARTERY BYPASS GRAFTING (CABG) (N/A) RADIAL ARTERY HARVEST (Left) TRANSESOPHAGEAL ECHOCARDIOGRAM (TEE) (N/A) as a surgical intervention .  The patient's history has been reviewed, patient examined, no change in status, stable for surgery.  I have reviewed the patient's chart and labs.  Questions were answered to the patient's satisfaction.     Loreli SlotSteven C Hendrickson
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
## 2015-08-05 NOTE — Anesthesia Postprocedure Evaluation (Signed)
Anesthesia Post Note  Patient: Willie HansenDaryle W Decelles  Procedure(s) Performed: Procedure(s) (LRB): CORONARY ARTERY BYPASS GRAFTING (CABG), ON PUMP, TIMES FIVE, USING LEFT INTERNAL MAMMARY ARTERY AND LEFT RADIAL ARTERY, RIGHT GREATER SAPHENOUS VEIN HARVESTED ENDOSCOPICALLY (N/A) RADIAL ARTERY HARVEST (Left) TRANSESOPHAGEAL ECHOCARDIOGRAM (TEE) (N/A)  Patient location during evaluation: SICU Anesthesia Type: General Level of consciousness: sedated and patient remains intubated per anesthesia plan Pain management: pain level controlled Vital Signs Assessment: post-procedure vital signs reviewed and stable Respiratory status: patient remains intubated per anesthesia plan and patient on ventilator - see flowsheet for VS Cardiovascular status: blood pressure returned to baseline Anesthetic complications: no    Last Vitals:  Filed Vitals:   08/05/15 1730 08/05/15 1745  BP:    Pulse: 78 76  Temp: 36.5 C 36.6 C  Resp: 10 12    Last Pain:  Filed Vitals:   08/05/15 1745  PainSc: 0-No pain                 Parrish Daddario COKER

## 2015-08-05 NOTE — Progress Notes (Signed)
Initiated Rapid Wean Protocol @ 1715. RN at bedside. RT will continue to monitor.

## 2015-08-05 NOTE — Progress Notes (Signed)
  Echocardiogram Echocardiogram Transesophageal has been performed.  Leta JunglingCooper, Saretta Dahlem M 08/05/2015, 8:25 AM

## 2015-08-05 NOTE — Brief Op Note (Addendum)
08/01/2015 - 08/05/2015  11:34 AM  PATIENT:  Willie Walker  50 y.o. male  PRE-OPERATIVE DIAGNOSIS:  3 VESSEL CAD  POST-OPERATIVE DIAGNOSIS:  3 VESSEL CAD  PROCEDURE:  Procedure(s):  CORONARY ARTERY BYPASS GRAFTING x 5 -LIMA to LAD -SVG to DIAGONAL -SEQ SVG to RAMUS and OM -LEFT RADIAL ARTERY to PDA  OPEN RADIAL ARTERY HARVEST (Left)  ENDOSCOPIC HARVEST GREATER SAPHENOUS VEIN -Right Thigh  TRANSESOPHAGEAL ECHOCARDIOGRAM (TEE) (N/A)  SURGEON:  Surgeon(s) and Role:    * Loreli SlotSteven C Toben Acuna, MD - Primary  PHYSICIAN ASSISTANT: Lowella DandyErin Barrett PA-C  ASSISTANTS: Wynelle Fannyaul Villareal CST, CSFA   ANESTHESIA:   general  EBL:  Total I/O In: -  Out: 350 [Urine:350]  BLOOD ADMINISTERED: CELLSAVER  DRAINS: Left Pleural Chest Tubes, Mediastinal Chest Drains   LOCAL MEDICATIONS USED:  NONE  SPECIMEN:  No Specimen  DISPOSITION OF SPECIMEN:  N/A  COUNTS:  YES  PLAN OF CARE: Admit to inpatient   PATIENT DISPOSITION:  ICU - intubated and hemodynamically stable.   Delay start of Pharmacological VTE agent (>24hrs) due to surgical blood loss or risk of bleeding: yes

## 2015-08-05 NOTE — Progress Notes (Signed)
Initiated Rapid Wean Protocol x 2 @ [id number]. RN at bedside. RT will continue to monitor.

## 2015-08-05 NOTE — Progress Notes (Signed)
NIF -18, VC .8L Dr. Franco ColletBartley called by RN to verify the okay to extubate with NIF of -18. MD gave the ok.

## 2015-08-05 NOTE — Progress Notes (Signed)
Pt failed weaning parameters, NIF -16 and VC .900. Pt placed back on original vent settings, will retry Rapid Wean Protocol in 30 mins. RT will continue to monitor.

## 2015-08-05 NOTE — Anesthesia Procedure Notes (Addendum)
Central Venous Catheter Insertion Performed by: anesthesiologist Patient location: Pre-op. Preanesthetic checklist: patient identified, IV checked, site marked, risks and benefits discussed, surgical consent, monitors and equipment checked, pre-op evaluation, timeout performed and anesthesia consent Position: Trendelenburg Lidocaine 1% used for infiltration Landmarks identified Catheter size: 8.5 Fr Central line was placed.Procedure performed using ultrasound guided technique. Attempts: 1 Following insertion, line sutured and dressing applied. Post procedure assessment: blood return through all ports, free fluid flow and no air. Patient tolerated the procedure well with no immediate complications.    Central Venous Catheter Insertion Performed by: anesthesiologist Patient location: Pre-op. Preanesthetic checklist: patient identified, IV checked, site marked, risks and benefits discussed, surgical consent, monitors and equipment checked, pre-op evaluation, timeout performed and anesthesia consent Landmarks identified PA cath was placed.Swan type and PA catheter depth:thermodilation and 52PA Cath depth:52 Procedure performed using ultrasound guided technique. Attempts: 1 Patient tolerated the procedure well with no immediate complications.    Procedure Name: Intubation Date/Time: 08/05/2015 7:46 AM Performed by: Adonis HousekeeperNGELL, Derek Huneycutt M Pre-anesthesia Checklist: Patient identified, Emergency Drugs available, Suction available and Patient being monitored Patient Re-evaluated:Patient Re-evaluated prior to inductionOxygen Delivery Method: Circle system utilized Preoxygenation: Pre-oxygenation with 100% oxygen Intubation Type: IV induction Ventilation: Mask ventilation without difficulty Laryngoscope Size: Mac and 4 Grade View: Grade I Tube type: Oral Tube size: 8.0 mm Number of attempts: 1 Airway Equipment and Method: Stylet Placement Confirmation: positive ETCO2,  ETT inserted through  vocal cords under direct vision and breath sounds checked- equal and bilateral Secured at: 21 cm Tube secured with: Tape Dental Injury: Teeth and Oropharynx as per pre-operative assessment

## 2015-08-05 NOTE — Anesthesia Postprocedure Evaluation (Signed)
Anesthesia Post Note  Patient: Willie HansenDaryle W Hogg  Procedure(s) Performed: Procedure(s) (LRB): CORONARY ARTERY BYPASS GRAFTING (CABG), ON PUMP, TIMES FIVE, USING LEFT INTERNAL MAMMARY ARTERY AND LEFT RADIAL ARTERY, RIGHT GREATER SAPHENOUS VEIN HARVESTED ENDOSCOPICALLY (N/A) RADIAL ARTERY HARVEST (Left) TRANSESOPHAGEAL ECHOCARDIOGRAM (TEE) (N/A)  Anesthesia Post Evaluation  Last Vitals:  Filed Vitals:   08/05/15 1730 08/05/15 1745  BP:    Pulse: 78 76  Temp: 36.5 C 36.6 C  Resp: 10 12    Last Pain:  Filed Vitals:   08/05/15 1745  PainSc: 0-No pain                 Jaimen Melone COKER

## 2015-08-06 ENCOUNTER — Encounter (HOSPITAL_COMMUNITY): Payer: Self-pay | Admitting: Thoracic Surgery (Cardiothoracic Vascular Surgery)

## 2015-08-06 ENCOUNTER — Inpatient Hospital Stay (HOSPITAL_COMMUNITY): Admission: RE | Admit: 2015-08-06 | Payer: Self-pay | Source: Ambulatory Visit

## 2015-08-06 ENCOUNTER — Inpatient Hospital Stay (HOSPITAL_COMMUNITY): Payer: Self-pay

## 2015-08-06 LAB — GLUCOSE, CAPILLARY
GLUCOSE-CAPILLARY: 114 mg/dL — AB (ref 65–99)
GLUCOSE-CAPILLARY: 94 mg/dL (ref 65–99)
GLUCOSE-CAPILLARY: 106 mg/dL — AB (ref 65–99)
GLUCOSE-CAPILLARY: 109 mg/dL — AB (ref 65–99)
GLUCOSE-CAPILLARY: 108 mg/dL — AB (ref 65–99)
GLUCOSE-CAPILLARY: 108 mg/dL — AB (ref 65–99)
GLUCOSE-CAPILLARY: 117 mg/dL — AB (ref 65–99)
GLUCOSE-CAPILLARY: 135 mg/dL — AB (ref 65–99)
Glucose-Capillary: 101 mg/dL — ABNORMAL HIGH (ref 65–99)
Glucose-Capillary: 104 mg/dL — ABNORMAL HIGH (ref 65–99)
Glucose-Capillary: 112 mg/dL — ABNORMAL HIGH (ref 65–99)
Glucose-Capillary: 114 mg/dL — ABNORMAL HIGH (ref 65–99)
Glucose-Capillary: 114 mg/dL — ABNORMAL HIGH (ref 65–99)
Glucose-Capillary: 117 mg/dL — ABNORMAL HIGH (ref 65–99)
Glucose-Capillary: 122 mg/dL — ABNORMAL HIGH (ref 65–99)
Glucose-Capillary: 131 mg/dL — ABNORMAL HIGH (ref 65–99)
Glucose-Capillary: 209 mg/dL — ABNORMAL HIGH (ref 65–99)
Glucose-Capillary: 96 mg/dL (ref 65–99)

## 2015-08-06 LAB — CBC
HCT: 25.4 % — ABNORMAL LOW (ref 39.0–52.0)
HEMATOCRIT: 24.7 % — AB (ref 39.0–52.0)
HEMOGLOBIN: 8.4 g/dL — AB (ref 13.0–17.0)
Hemoglobin: 8.7 g/dL — ABNORMAL LOW (ref 13.0–17.0)
MCH: 29.6 pg (ref 26.0–34.0)
MCH: 30.2 pg (ref 26.0–34.0)
MCHC: 34 g/dL (ref 30.0–36.0)
MCHC: 34.3 g/dL (ref 30.0–36.0)
MCV: 87 fL (ref 78.0–100.0)
MCV: 88.2 fL (ref 78.0–100.0)
Platelets: 138 10*3/uL — ABNORMAL LOW (ref 150–400)
Platelets: 133 10*3/uL — ABNORMAL LOW (ref 150–400)
RBC: 2.84 MIL/uL — ABNORMAL LOW (ref 4.22–5.81)
RBC: 2.88 MIL/uL — ABNORMAL LOW (ref 4.22–5.81)
RDW: 14.7 % (ref 11.5–15.5)
RDW: 14.9 % (ref 11.5–15.5)
WBC: 10.3 10*3/uL (ref 4.0–10.5)
WBC: 10.4 10*3/uL (ref 4.0–10.5)

## 2015-08-06 LAB — BASIC METABOLIC PANEL
Anion gap: 4 — ABNORMAL LOW (ref 5–15)
BUN: 14 mg/dL (ref 6–20)
CALCIUM: 8.1 mg/dL — AB (ref 8.9–10.3)
CHLORIDE: 109 mmol/L (ref 101–111)
CO2: 24 mmol/L (ref 22–32)
CREATININE: 1.28 mg/dL — AB (ref 0.61–1.24)
GFR calc non Af Amer: >60 mL/min (ref >60)
Glucose, Bld: 129 mg/dL — ABNORMAL HIGH (ref 65–99)
Potassium: 4.8 mmol/L (ref 3.5–5.1)
SODIUM: 137 mmol/L (ref 135–145)

## 2015-08-06 LAB — TYPE AND SCREEN
ABO/RH(D): O POS
Antibody Screen: NEGATIVE
UNIT DIVISION: 0
UNIT DIVISION: 0
Unit division: 0
Unit division: 0

## 2015-08-06 LAB — POCT I-STAT, CHEM 8
BUN: 21 mg/dL — ABNORMAL HIGH (ref 6–20)
CREATININE: 1.5 mg/dL — AB (ref 0.61–1.24)
Calcium, Ion: 1.23 mmol/L (ref 1.13–1.30)
Chloride: 99 mmol/L — ABNORMAL LOW (ref 101–111)
GLUCOSE: 168 mg/dL — AB (ref 65–99)
HEMATOCRIT: 27 % — AB (ref 39.0–52.0)
HEMOGLOBIN: 9.2 g/dL — AB (ref 13.0–17.0)
Potassium: 5.2 mmol/L — ABNORMAL HIGH (ref 3.5–5.1)
Sodium: 135 mmol/L (ref 135–145)
TCO2: 25 mmol/L (ref 0–100)

## 2015-08-06 LAB — CREATININE, SERUM
Creatinine, Ser: 1.46 mg/dL — ABNORMAL HIGH (ref 0.61–1.24)
GFR calc Af Amer: >60 mL/min (ref >60)
GFR calc non Af Amer: 54 mL/min — ABNORMAL LOW (ref >60)

## 2015-08-06 LAB — MAGNESIUM
Magnesium: 2.7 mg/dL — ABNORMAL HIGH (ref 1.7–2.4)
Magnesium: 2.4 mg/dL (ref 1.7–2.4)

## 2015-08-06 MED ORDER — FUROSEMIDE 10 MG/ML IJ SOLN
20.0000 mg | Freq: Two times a day (BID) | INTRAMUSCULAR | Status: AC
  Administered 2015-08-06 (×2): 20 mg via INTRAVENOUS
  Filled 2015-08-06 (×2): qty 2

## 2015-08-06 MED ORDER — INSULIN DETEMIR 100 UNIT/ML ~~LOC~~ SOLN: 15.0000 [IU] | Freq: Two times a day (BID) | SUBCUTANEOUS | Status: DC

## 2015-08-06 MED ORDER — ISOSORBIDE MONONITRATE ER 30 MG PO TB24
30.0000 mg | ORAL_TABLET | Freq: Every day | ORAL | Status: DC
  Administered 2015-08-06 – 2015-08-10 (×5): 30 mg via ORAL
  Filled 2015-08-06 (×5): qty 1

## 2015-08-06 MED ORDER — INSULIN ASPART 100 UNIT/ML ~~LOC~~ SOLN
3.0000 [IU] | Freq: Three times a day (TID) | SUBCUTANEOUS | Status: DC
  Administered 2015-08-06 – 2015-08-10 (×9): 3 [IU] via SUBCUTANEOUS

## 2015-08-06 MED ORDER — COLCHICINE 0.6 MG PO TABS
0.6000 mg | ORAL_TABLET | Freq: Every day | ORAL | Status: DC
  Administered 2015-08-06: 0.6 mg via ORAL
  Filled 2015-08-06: qty 1

## 2015-08-06 MED ORDER — INSULIN DETEMIR 100 UNIT/ML ~~LOC~~ SOLN
15.0000 [IU] | Freq: Two times a day (BID) | SUBCUTANEOUS | Status: DC
  Administered 2015-08-06 (×2): 15 [IU] via SUBCUTANEOUS
  Filled 2015-08-06 (×4): qty 0.15

## 2015-08-06 MED ORDER — INSULIN ASPART 100 UNIT/ML ~~LOC~~ SOLN
0.0000 [IU] | SUBCUTANEOUS | Status: DC
  Administered 2015-08-06: 8 [IU] via SUBCUTANEOUS
  Administered 2015-08-06: 5 [IU] via SUBCUTANEOUS

## 2015-08-06 MED ORDER — ENOXAPARIN SODIUM 40 MG/0.4ML ~~LOC~~ SOLN: 40.0000 mg | Freq: Every day | SUBCUTANEOUS | Status: DC

## 2015-08-06 MED FILL — Heparin Sodium (Porcine) Inj 1000 Unit/ML: INTRAMUSCULAR | Qty: 10 | Status: AC

## 2015-08-06 MED FILL — Sodium Chloride IV Soln 0.9%: INTRAVENOUS | Qty: 2000 | Status: AC

## 2015-08-06 MED FILL — Mannitol IV Soln 20%: INTRAVENOUS | Qty: 500 | Status: AC

## 2015-08-06 MED FILL — Electrolyte-R (PH 7.4) Solution: INTRAVENOUS | Qty: 4000 | Status: AC

## 2015-08-06 MED FILL — Sodium Bicarbonate IV Soln 8.4%: INTRAVENOUS | Qty: 50 | Status: AC

## 2015-08-06 MED FILL — Lidocaine HCl IV Inj 20 MG/ML: INTRAVENOUS | Qty: 5 | Status: AC

## 2015-08-06 NOTE — Progress Notes (Signed)
EKG CRITICAL VALUE     12 lead EKG performed.  Critical value noted.  Marisue IvanLiz, RN notified.   Oda Coganiara S Sheretta Grumbine, CCT 08/06/2015 7:06 AM

## 2015-08-06 NOTE — Care Management Note (Signed)
Case Management Note  Patient Details  Name: Willie Walker MRN: [id number] Date of Birth: 04/20/1965  Subjective/Objective:    Pt states he lives with his mother who has dementia.  Significant other is @ bedside and states she will be available to provide 24/7 assistance when pt is discharged.                           Expected Discharge Plan:  Home/Self Care  Discharge planning Services  CM Consult  Status of Service:  In process, will continue to follow  Magdalene RiverMayo, Vona Whiters T, RN 08/06/2015, 10:50 AM

## 2015-08-06 NOTE — Progress Notes (Signed)
[facility] BRIEF SICU PROGRESS NOTE  1 Day Post-Op  S/P Procedure(s) (LRB): CORONARY ARTERY BYPASS GRAFTING (CABG), ON PUMP, TIMES FIVE, USING LEFT INTERNAL MAMMARY ARTERY AND LEFT RADIAL ARTERY, RIGHT GREATER SAPHENOUS VEIN HARVESTED ENDOSCOPICALLY (N/A) RADIAL ARTERY HARVEST (Left) TRANSESOPHAGEAL ECHOCARDIOGRAM (TEE) (N/A)   Stable day NSR w/ stable BP Breathing comfortably w/ O2 sats 98% on 2 L/min Diuresing well  Plan: Continue current plan  Purcell Nailslarence H Zackaria Burkey, MD 08/06/2015 7:14 PM

## 2015-08-06 NOTE — CV Procedure (Signed)
Intra-operative Transesophageal Echocardiography Report:  Willie Walker is a 50 year old male with a history of coronary artery disease with multiple prior interventions, hypertension, hyperlipidemia, type 1 diabetes and substance abuse who presented with a six-week history of worsening exertional chest pain and shortness of breath. Cardiac catheterization revealed critical three-vessel coronary artery disease and normal left ventricular function. He is now scheduled to undergo coronary artery bypass grafting by Dr. Dorris FetchHendrickson. Intraoperative transesophageal echocardiography was indicated to evaluate the right and left ventricular function, to serve as a monitor for intraoperative volume status and cardiac function, and to assess for any valvular pathology.  [facility] and general anesthesia was induced without difficulty. Following endotracheal intubation and orogastric suctioning, the transesophageal echocardiography probe was inserted into the esophagus without difficulty.  Impression: Pre-bypass findings:  1. Aortic valve: The aortic valve was trileaflet. The leaflets were thin and pliable and opened normally without restriction. There was no aortic insufficiency.   2.  Mitral valve: The mitral leaflets were normal appearing and opened without restriction. There was trace mitral insufficiency.  3. Left ventricle: The left ventricular cavity was of normal size. There was normal wall thickness. LV end-diastolic diameter was 38 mm and the interventricular septum measured 8.6 mm and the inferior wall measured 8.6 cm at end diastole at the mid-papillary level in the transgastric short axis view. There was normal left ventricular systolic function with an ejection fraction estimated at 55%. No regional wall motion abnormalities could be visualized.  4. Right ventricle: The right ventricular cavity was of normal size. There was normal appearing  right ventricular systolic function.  5. Tricuspid valve: The tricuspid valve appeared structurally normal. There was trace tricuspid insufficiency.  6. Interatrial septum: The interatrial septum was intact without evidence of patent foramen ovale or atrial septal defect by color Doppler and bubble study.  7. Left atrium: There was no thrombus noted within left atrium or left atrial appendage.  8. Ascending aorta: The  aortic root and ascending aorta were prominent but were within normal limits of size. There was a well-defined aortic root and sinotubular ridge. The aortic root at the sinuses of Valsalva measured 35.8 mm, the sinotubular ridge measured 3.01 cm and the proximal ascending aorta measured 2.11 cm. There was no significant atheromatous disease noted within the ascending aorta.  9. Descending aorta: The descending aorta was of normal diameter and measured 2.1 cm. There was no significant atheromatous disease noted within the descending aorta.   Post-bypass Findings:   1. The aortic valve: The aortic valve was unchanged from the pre-bypass study. The leaflets opened normally and there was no aortic insufficiency.  2. Mitral valve: The mitral valve was unchanged from the pre-bypass study. The leaflets opened normally without restriction and there was trace mitral insufficiency.   3. Left ventricle: The left ventricular function appeared normal. There was good contractility in all segments interrogated and the ejection fraction was estimated at 55-60%.  4. Right ventricle: There was normal right ventricular systolic function and normal right ventricular size.  5. Tricuspid valve: The tricuspid valve appeared structurally normal and there was trace tricuspid insufficiency.  Willie Walker, M.D.

## 2015-08-06 NOTE — Op Note (Signed)
NAMEADELARD, SANON NO.:  [id number]  MEDICAL RECORD NO.:  [id number]  LOCATION:  2S07C                        FACILITY:  [facility]  PHYSICIAN:  Salvatore Decent. Dorris Fetch, M.D.DATE OF BIRTH:  07-Aug-1965  DATE OF PROCEDURE:  08/05/2015 DATE OF DISCHARGE:                              OPERATIVE REPORT   PREOPERATIVE DIAGNOSIS:  Three-vessel coronary artery disease with unstable angina.  POSTOPERATIVE DIAGNOSIS:  Three-vessel coronary artery disease with unstable angina.  PROCEDURES:  Median sternotomy, extracorporeal circulation, coronary artery bypass grafting x5 (left internal mammary artery to left anterior descending, saphenous vein graft to diagonal, sequential saphenous vein graft to ramus intermedius and obtuse marginal, left radial artery to posterior descending), endoscopic vein harvest right thigh.  SURGEON:  Salvatore Decent. Dorris Fetch, M.D.  ASSISTANT:  Lowella Dandy, PA-C.  SECOND ASSISTANT:  Armanda Magic, CST, CSFA.  ANESTHESIA:  General.  FINDINGS:  Good-quality conduits.  PDA- poor-quality target.  Remaining targets good quality.  Good left ventricular function with no significant valvular pathology by transesophageal echocardiography.  CLINICAL NOTE:  Mr. Langston is a 50 year old man with a longstanding history of coronary artery disease and multiple contributing factors including hypertension, hyperlipidemia, type 1 diabetes and polysubstance abuse.  He began having substernal chest pain with exertion about 6 weeks ago.  It has become more frequent and more severe.  He underwent cardiac catheterization, which revealed severe three-vessel coronary artery disease in the setting of previous stenting.  He was referred for coronary artery bypass grafting.  The indications, risks, benefits and alternatives were discussed in detail with the patient.  He understood and accepted the risks and agreed to proceed.  OPERATIVE NOTE:  Mr. Fitzmaurice was brought to  the preop holding area on August 05, 2015, anesthesia placed a Swan-Ganz catheter and arterial blood pressure monitoring line. The Allen's test on the left arm was repeated with pulse oximetry on the left index finger.  There was no loss of tracing, only a slight damping with radial artery occlusion.  This confirmed the findings to the vascular lab in their preoperative assessment. He was taken to the operating room, anesthetized and intubated.  A Foley catheter was placed.  Intravenous antibiotics were administered.  Transesophageal echocardiography was performed.  It revealed preserved left ventricular function with no significant valvular pathology.    The chest, abdomen, legs and left arm were prepped and draped in usual sterile fashion.  An incision was made over the volar aspect of the left wrist and the distal portion of the radial artery was dissected out. Initially with proximal occlusion, there was no palpable pulse. Papaverine was applied to the radial artery with proximal occlusion. There was a palpable pulse and a Doppler signal present.  The incision was extended up to the level of the antecubital fossa and the radial artery was harvested using the Harmonic scalpel.  2000 units of heparin was administered during the vessel harvest.  Simultaneously with the radial harvest, a median sternotomy was performed and the left internal mammary artery was harvested using standard technique.  The mammary was a good-quality vessel with excellent flow when divided distally.  An incision was made in the medial aspect of the right leg.  The greater saphenous vein was harvested from the right thigh endoscopically.  It was a good-quality vein.  The legs and left arm incisions were closed in standard fashion.  The left arm was wrapped and tucked to the patient's side.  A sternal retractor was placed.  The remainder of the heparin was given.  The pericardium was opened.  The ascending aorta  was inspected.  It was of normal caliber with no evidence of atherosclerotic disease.  After confirming adequate anticoagulation with ACT measurement, the aorta was cannulated via concentric 2-0 Ethibond pledgeted pursestring sutures.  A dual-stage venous cannula was placed via pursestring suture in the right atrial appendage.  Cardiopulmonary bypass was instituted and flows were maintained per protocol.  The patient was cooled to 32 degrees Celsius. The coronary arteries were inspected and anastomotic sites were chosen. The conduits were inspected and cut to length.  A foam pad was placed in the pericardium to insulate the heart.  A temperature probe was placed in the myocardial septum and a cardioplegia cannula was placed in the ascending aorta.  The aorta was crossclamped.  The left ventricle was emptied via the aortic root vent.  Cardiac arrest then was achieved with a combination of cold antegrade blood cardioplegia and topical iced saline.  1.5 L of cardioplegia was administered.  There was a rapid diastolic arrest. There was ultimately septal cooling to 10 degrees Celsius.  A reversed saphenous vein graft was placed sequentially to the ramus intermedius and obtuse marginal.  The ramus was a large vessel 2 mm in diameter.  A 1.5-mm probe did pass distally.  There was more significant plaque in the vessel in its midportion than had been evident on catheterization.  A side-to-side anastomosis was performed to this vessel with a running 7-0 Prolene suture.  A probe passed easily proximally and distally.  All anastomoses were probed at their completion to ensure patency before tying the suture.  The distal end of the vein then was cut to length and anastomosed end-to-side to the obtuse marginal, this was 1.5-mm vessel.  There was some disease in the distal vessel, but a 1-mm probe did pass into both branches of the bifurcation An end-to-side anastomosis was performed with a running  7-0 Prolene suture.  Cardioplegia was administered down the graft.  There was good flow and good hemostasis.  A reversed saphenous vein graft was placed end-to-side to the large diagonal branch.  This vessel was approximately 70% stenosed.  It did accept a 1.5-mm probe.  The vein was good quality and was anastomosed end-to-side with a running 7-0 Prolene suture.  A probe passed easily and cardioplegia was administered with good flow and good hemostasis.  The distal end of the left radial artery was beveled, it was then anastomosed end-to-side to the posterior descending.  Posterior descending was grafted just beyond a heavily stented area that arose and passed over the acute margin of the heart and there were stents throughout. It was the only graftable target in the distal right distribution.  It was a poor-quality target, once the arteriotomy was made.  The radial was anastomosed end-to-side with a running 8-0 Prolene suture.  A 1-mm probe did pass distally. With cardioplegia administration and there was good backbleeding through the radial graft.  The left internal mammary artery was brought through a window in the pericardium.  The distal end was beveled.  It was then anastomosed end- to-side to the distal LAD.  The LAD was a 1.5-mm good-quality target.  The mammary was good quality. An end-to-side anastomosis was performed with a running 8-0 Prolene suture.  At the completion of the anastomosis, the bulldog clamp was briefly removed to inspect for hemostasis.  Rapid septal rewarming was noted.  The bulldog clamp was replaced and the mammary pedicle was tacked to the epicardial surface of the heart with 6-0 Prolene sutures.  Additional cardioplegia was administered down the aortic root.  The vein grafts and radial artery were cut to length.  The cardioplegia cannula was removed from the ascending aorta.  The proximal vein graft and radial anastomoses were performed to 4.0-mm  punch aortotomies with running 6-0 Prolene sutures for the vein and a running 7-0 Prolene suture for the radial.  At the completion of the final proximal anastomosis, the patient was placed in Trendelenburg position. Lidocaine was administered.  The bulldog clamp was again removed from the left mammary artery.  The left ventricle and aortic root were de-aired and the aortic crossclamp was removed.  The total crossclamp time was 80 minutes.  The patient required 2 defibrillations with 10 joules and then remained in sinus rhythm thereafter.  While rewarming was completed, all proximal and distal anastomoses were inspected for hemostasis.  Epicardial pacing wires placed on the right ventricle and right atrium.  When the patient had rewarmed to a core temperature of 37 degrees Celsius, he was weaned from cardiopulmonary bypass on the first attempt.  He did not require inotropic support or pacing.  He was on low-dose nitroglycerin for the radial artery graft and remained on that through the post bypass.  The initial cardiac index was greater than 2 L/min/m2, and he remained hemodynamically stable throughout the post bypass.  Total bypass time was 116 minutes.  A test dose of protamine was administered and was well tolerated.  The atrial and aortic cannulae were removed.  The remainder of the protamine was administered without incident.  The chest was irrigated with warm saline.  Hemostasis was achieved.  Left pleural and mediastinal chest tubes were placed through separate subcostal incisions.  The pericardium was reapproximated over the ascending aorta and base of the heart with interrupted 3-0 silk sutures.  Catheter came together easily without tension or kinking the underlying grafts.  The sternum was closed with a combination of single and double heavy-gauge stainless steel wires.  The pectoralis fascia, subcutaneous tissue and skin were closed in standard fashion.  All sponge, needle  and instrument counts were correct at the end of the procedure.  The patient was taken from the operating room to the Surgical Intensive Care Unit intubated and in good condition.     Salvatore DecentSteven C. Dorris FetchHendrickson, M.D.     SCH/MEDQ  D:  08/05/2015  T:  08/06/2015  Job:  [id number]

## 2015-08-06 NOTE — Progress Notes (Signed)
1 Day Post-Op Procedure(s) (LRB): CORONARY ARTERY BYPASS GRAFTING (CABG), ON PUMP, TIMES FIVE, USING LEFT INTERNAL MAMMARY ARTERY AND LEFT RADIAL ARTERY, RIGHT GREATER SAPHENOUS VEIN HARVESTED ENDOSCOPICALLY (N/A) RADIAL ARTERY HARVEST (Left) TRANSESOPHAGEAL ECHOCARDIOGRAM (TEE) (N/A) Subjective: Some incisional pain  Objective: Vital signs in last 24 hours: Temp:  [95.4 F (35.2 C)-98.8 F (37.1 C)] 97.9 F (36.6 C) (07/18 0800) Pulse Rate:  [66-89] 69 (07/18 0800) Cardiac Rhythm:  [-] Normal sinus rhythm (07/18 0753) Resp:  [10-29] 26 (07/18 0800) BP: (89-118)/(53-87) 112/73 mmHg (07/18 0800) SpO2:  [92 %-100 %] 99 % (07/18 0800) Arterial Line BP: (96-158)/(51-87) 147/65 mmHg (07/18 0800) FiO2 (%):  [40 %-50 %] 40 % (07/17 1908) Weight:  [162 lb 11.2 oz (73.8 kg)] 162 lb 11.2 oz (73.8 kg) (07/18 0500)  Hemodynamic parameters for last 24 hours: PAP: (15-48)/(4-26) 48/21 mmHg CO:  [3.1 L/min-5.7 L/min] 5 L/min CI:  [1.6 L/min/m2-3 L/min/m2] 2.6 L/min/m2  Intake/Output from previous day: 07/17 0701 - 07/18 0700 In: 5753.4 [I.V.:4053.4; Blood:250; IV Piggyback:1450] Out: 5570 [Urine:4670; Blood:500; Chest Tube:400] Intake/Output this shift: Total I/O In: 63.9 [I.V.:63.9] Out: 200 [Urine:200]  General appearance: alert, cooperative and no distress Neurologic: intact and L hand sensation and motor intact Heart: regular rate and rhythm and loud rub Lungs: diminished breath sounds bibasilar Abdomen: nondistended, nontender, hypoactive BS  Lab Results:  Recent Labs  08/05/15 2035 08/06/15 0305  WBC 12.0* 10.3  HGB 9.3* 8.4*  HCT 27.5* 24.7*  PLT 151 138*   BMET:  Recent Labs  08/05/15 2007 08/05/15 2035 08/06/15 0305  NA 137  --  137  K 4.5  --  4.8  CL 105  --  109  CO2  --   --  24  GLUCOSE 156*  --  129*  BUN 17  --  14  CREATININE 1.30* 1.44* 1.28*  CALCIUM  --   --  8.1*    PT/INR:  Recent Labs  08/05/15 1405  LABPROT 18.7*  INR 1.56*   ABG     Component Value Date/Time   PHART 7.317* 08/05/2015 2059   HCO3 21.0 08/05/2015 2059   TCO2 22 08/05/2015 2059   ACIDBASEDEF 5.0* 08/05/2015 2059   O2SAT 99.0 08/05/2015 2059   CBG (last 3)   Recent Labs  08/06/15 0520 08/06/15 0607 08/06/15 0659  GLUCAP 101* 104* 94    Assessment/Plan: S/P Procedure(s) (LRB): CORONARY ARTERY BYPASS GRAFTING (CABG), ON PUMP, TIMES FIVE, USING LEFT INTERNAL MAMMARY ARTERY AND LEFT RADIAL ARTERY, RIGHT GREATER SAPHENOUS VEIN HARVESTED ENDOSCOPICALLY (N/A) RADIAL ARTERY HARVEST (Left) TRANSESOPHAGEAL ECHOCARDIOGRAM (TEE) (N/A) -  CV- stable hemodynamics- dc swan and arterial line  Imdur for radial graft  ECG ST elevation noted, has  A loud rub, findings most c/w pericarditis not ischemia  ASA, beta blocker, lipitor  RESP- insignificant PTX on CXR, IS for bibasilar atelectasis  RENAL- creatinine down a little this AM  Volume overloaded with 3rd spacing- diurese  ENDO- CBG well controlled, transition off insulin drip  Anemia secondary to ABL- follow  SCD + enoxaparin for DVT prophylaxis  DC chest tubes  mobilize   LOS: 5 days    Loreli SlotSteven C Hendrickson 08/06/2015

## 2015-08-06 NOTE — Addendum Note (Signed)
Addendum  created 08/06/15 0610 by Kipp Broodavid Koltyn Kelsay, MD   Modules edited: Notes Section   Notes Section:  File: [id number]; Pend: [id number]; Beverely LowPend: [id number]; Beverely LowPend: [id number]; Pend: [id number]

## 2015-08-07 ENCOUNTER — Telehealth: Payer: Self-pay | Admitting: *Deleted

## 2015-08-07 ENCOUNTER — Inpatient Hospital Stay (HOSPITAL_COMMUNITY): Payer: Self-pay

## 2015-08-07 LAB — CBC
HCT: 26.4 % — ABNORMAL LOW (ref 39.0–52.0)
Hemoglobin: 8.7 g/dL — ABNORMAL LOW (ref 13.0–17.0)
MCH: 28.9 pg (ref 26.0–34.0)
MCHC: 33 g/dL (ref 30.0–36.0)
MCV: 87.7 fL (ref 78.0–100.0)
PLATELETS: 148 10*3/uL — AB (ref 150–400)
RBC: 3.01 MIL/uL — AB (ref 4.22–5.81)
RDW: 15 % (ref 11.5–15.5)
WBC: 11.2 10*3/uL — AB (ref 4.0–10.5)

## 2015-08-07 LAB — BASIC METABOLIC PANEL
Anion gap: 5 (ref 5–15)
BUN: 18 mg/dL (ref 6–20)
CALCIUM: 8.8 mg/dL — AB (ref 8.9–10.3)
CO2: 28 mmol/L (ref 22–32)
CREATININE: 1.29 mg/dL — AB (ref 0.61–1.24)
Chloride: 101 mmol/L (ref 101–111)
Glucose, Bld: 98 mg/dL (ref 65–99)
Potassium: 4.5 mmol/L (ref 3.5–5.1)
SODIUM: 134 mmol/L — AB (ref 135–145)

## 2015-08-07 LAB — GLUCOSE, CAPILLARY
GLUCOSE-CAPILLARY: 113 mg/dL — AB (ref 65–99)
GLUCOSE-CAPILLARY: 130 mg/dL — AB (ref 65–99)
GLUCOSE-CAPILLARY: 143 mg/dL — AB (ref 65–99)
Glucose-Capillary: 119 mg/dL — ABNORMAL HIGH (ref 65–99)
Glucose-Capillary: 103 mg/dL — ABNORMAL HIGH (ref 65–99)

## 2015-08-07 MED ORDER — SODIUM CHLORIDE 0.9 % IV SOLN: 250.0000 mL | INTRAVENOUS | Status: DC | PRN

## 2015-08-07 MED ORDER — INSULIN ASPART 100 UNIT/ML ~~LOC~~ SOLN
0.0000 [IU] | Freq: Three times a day (TID) | SUBCUTANEOUS | Status: DC
  Administered 2015-08-07 – 2015-08-08 (×4): 2 [IU] via SUBCUTANEOUS
  Administered 2015-08-09: 3 [IU] via SUBCUTANEOUS
  Administered 2015-08-10: 2 [IU] via SUBCUTANEOUS

## 2015-08-07 MED ORDER — MAGNESIUM HYDROXIDE 400 MG/5ML PO SUSP: 30.0000 mL | Freq: Every day | ORAL | Status: DC | PRN

## 2015-08-07 MED ORDER — METFORMIN HCL 500 MG PO TABS
500.0000 mg | ORAL_TABLET | Freq: Every day | ORAL | Status: DC
  Administered 2015-08-07 – 2015-08-10 (×4): 500 mg via ORAL
  Filled 2015-08-07 (×4): qty 1

## 2015-08-07 MED ORDER — ZOLPIDEM TARTRATE 5 MG PO TABS: 5.0000 mg | ORAL_TABLET | Freq: Every evening | ORAL | Status: DC | PRN

## 2015-08-07 MED ORDER — SODIUM CHLORIDE 0.9% FLUSH: 10.0000 mL | Freq: Two times a day (BID) | INTRAVENOUS | Status: DC

## 2015-08-07 MED ORDER — SODIUM CHLORIDE 0.9% FLUSH
3.0000 mL | Freq: Two times a day (BID) | INTRAVENOUS | Status: DC
  Administered 2015-08-07 – 2015-08-09 (×5): 3 mL via INTRAVENOUS

## 2015-08-07 MED ORDER — COLCHICINE 0.6 MG PO TABS
0.6000 mg | ORAL_TABLET | Freq: Two times a day (BID) | ORAL | Status: DC
  Administered 2015-08-07 – 2015-08-10 (×7): 0.6 mg via ORAL
  Filled 2015-08-07 (×7): qty 1

## 2015-08-07 MED ORDER — LISINOPRIL 10 MG PO TABS
10.0000 mg | ORAL_TABLET | Freq: Every day | ORAL | Status: DC
  Administered 2015-08-08: 10 mg via ORAL
  Filled 2015-08-07: qty 1

## 2015-08-07 MED ORDER — SODIUM CHLORIDE 0.9% FLUSH: 3.0000 mL | INTRAVENOUS | Status: DC | PRN

## 2015-08-07 MED ORDER — ALUM & MAG HYDROXIDE-SIMETH 200-200-20 MG/5ML PO SUSP: 15.0000 mL | ORAL | Status: DC | PRN

## 2015-08-07 MED ORDER — ENOXAPARIN SODIUM 40 MG/0.4ML ~~LOC~~ SOLN
40.0000 mg | SUBCUTANEOUS | Status: DC
  Administered 2015-08-07 – 2015-08-10 (×3): 40 mg via SUBCUTANEOUS
  Filled 2015-08-07 (×4): qty 0.4

## 2015-08-07 MED ORDER — MOVING RIGHT ALONG BOOK
Freq: Once | Status: AC
  Administered 2015-08-07: 09:00:00
  Filled 2015-08-07: qty 1

## 2015-08-07 NOTE — Progress Notes (Signed)
Pt transferred to 2W02; pt ambulated entire distance from 2S07 to 2W02 without any difficulty; chart, pt belongings, and SCD's along for transfer; RN in room upon transfer.  Willie Walker, Meghann [place] A

## 2015-08-07 NOTE — Telephone Encounter (Signed)
-----   Message from Willie Angstlugbemiga E Jegede, MD sent at 08/07/2015  9:20 AM EDT ----- Please inform patient that there is slight improvement in his kidney function.

## 2015-08-07 NOTE — Progress Notes (Signed)
2 Days Post-Op Procedure(s) (LRB): CORONARY ARTERY BYPASS GRAFTING (CABG), ON PUMP, TIMES FIVE, USING LEFT INTERNAL MAMMARY ARTERY AND LEFT RADIAL ARTERY, RIGHT GREATER SAPHENOUS VEIN HARVESTED ENDOSCOPICALLY (N/A) RADIAL ARTERY HARVEST (Left) TRANSESOPHAGEAL ECHOCARDIOGRAM (TEE) (N/A) Subjective: Some incisional pain  Objective: Vital signs in last 24 hours: Temp:  [97.6 F (36.4 C)-98.8 F (37.1 C)] 98.4 F (36.9 C) (07/19 0800) Pulse Rate:  [60-79] 70 (07/19 0800) Cardiac Rhythm:  [-] Normal sinus rhythm (07/19 0734) Resp:  [14-27] 22 (07/19 0800) BP: (93-141)/(68-87) 111/86 mmHg (07/19 0800) SpO2:  [92 %-100 %] 96 % (07/19 0800) Weight:  [160 lb (72.576 kg)] 160 lb (72.576 kg) (07/19 0600)  Hemodynamic parameters for last 24 hours:    Intake/Output from previous day: 07/18 0701 - 07/19 0700 In: 372.9 [I.V.:272.9; IV Piggyback:100] Out: 3715 [ZOXWR:6045[Urine:3645; Chest Tube:70] Intake/Output this shift: Total I/O In: -  Out: 125 [Urine:125]  General appearance: alert, cooperative and no distress Neurologic: intact Heart: regular rate and rhythm and + rub Lungs: diminished breath sounds bibasilar Abdomen: normal findings: soft, non-tender  Lab Results:  Recent Labs  08/06/15 1559 08/07/15 0500  WBC 10.4 11.2*  HGB 8.7* 8.7*  HCT 25.4* 26.4*  PLT 133* 148*   BMET:  Recent Labs  08/06/15 0305 08/06/15 1555 08/06/15 1559 08/07/15 0500  NA 137 135  --  134*  K 4.8 5.2*  --  4.5  CL 109 99*  --  101  CO2 24  --   --  28  GLUCOSE 129* 168*  --  98  BUN 14 21*  --  18  CREATININE 1.28* 1.50* 1.46* 1.29*  CALCIUM 8.1*  --   --  8.8*    PT/INR:  Recent Labs  08/05/15 1405  LABPROT 18.7*  INR 1.56*   ABG    Component Value Date/Time   PHART 7.317* 08/05/2015 2059   HCO3 21.0 08/05/2015 2059   TCO2 25 08/06/2015 1555   ACIDBASEDEF 5.0* 08/05/2015 2059   O2SAT 99.0 08/05/2015 2059   CBG (last 3)   Recent Labs  08/06/15 1932 08/06/15 2350  08/07/15 0414  GLUCAP 209* 114* 113*    Assessment/Plan: S/P Procedure(s) (LRB): CORONARY ARTERY BYPASS GRAFTING (CABG), ON PUMP, TIMES FIVE, USING LEFT INTERNAL MAMMARY ARTERY AND LEFT RADIAL ARTERY, RIGHT GREATER SAPHENOUS VEIN HARVESTED ENDOSCOPICALLY (N/A) RADIAL ARTERY HARVEST (Left) TRANSESOPHAGEAL ECHOCARDIOGRAM (TEE) (N/A) Plan for transfer to step-down: see transfer orders  CV- in SR, BP well controlled  Restart lisinopril tomorrow  Still has a rub on exam- increase colchicine to BID  RESP- continue IS  RENAL- creatinine down slightly, still volume overload but no need for diuretics at present  ENDO- CBG well controlled for the most part with a single high reading > 200  Restart metformin, dc levemir, continue meal coverage today, CBG AC/HS  Anemia secondary to ABL- stable, follow  SCD + enoxaparin for DVT prophylaxis   LOS: 6 days    Loreli SlotSteven C Anjuli Gemmill 08/07/2015

## 2015-08-07 NOTE — Progress Notes (Signed)
Anesthesiology Follow-up:  Awake and alert. In good spirits. Neuro intact.  VS: T- 37.1 BP- 113/71 HR- 79 RR- 24 HR- 91 (SR) O2 Sat- 94% on RA  K- 4.5 BUN/Cr.- 18/1.23 glucose- 123 H/H- 8.7/26.4 platelets- 148,000  Extubated 4 hours post-op.  50 year old male 2 days S/P CABG X 5 for severe 3V CAD and preserved LV function. Stable post-op course, no apparent complications

## 2015-08-07 NOTE — Telephone Encounter (Signed)
Medical Assistant left message on patient's home and cell voicemail. Voicemail states to give a call back to Emslee Lopezmartinez with [facility] at [phone].  

## 2015-08-07 NOTE — Addendum Note (Signed)
Addendum  created 08/07/15 1539 by Kipp Broodavid Shanae Luo, MD   Modules edited: Notes Section   Notes Section:  File: [id number]; Pend: [id number]

## 2015-08-07 NOTE — Progress Notes (Signed)
Pt to transfer to 2W02; report called to Olegario MessierKathy, RN; pt made aware of transfer; pt to inform family per his request; will cont. To monitor.  Willie Walker, Panagiota Perfetti A

## 2015-08-08 ENCOUNTER — Inpatient Hospital Stay (HOSPITAL_COMMUNITY): Payer: Self-pay

## 2015-08-08 ENCOUNTER — Inpatient Hospital Stay (HOSPITAL_COMMUNITY): Payer: MEDICAID

## 2015-08-08 LAB — CBC
HCT: 26.8 % — ABNORMAL LOW (ref 39.0–52.0)
Hemoglobin: 8.8 g/dL — ABNORMAL LOW (ref 13.0–17.0)
MCH: 28.8 pg (ref 26.0–34.0)
MCHC: 32.8 g/dL (ref 30.0–36.0)
MCV: 87.6 fL (ref 78.0–100.0)
PLATELETS: 186 10*3/uL (ref 150–400)
RBC: 3.06 MIL/uL — AB (ref 4.22–5.81)
RDW: 14.7 % (ref 11.5–15.5)
WBC: 11.5 10*3/uL — AB (ref 4.0–10.5)

## 2015-08-08 LAB — BASIC METABOLIC PANEL
ANION GAP: 4 — AB (ref 5–15)
BUN: 21 mg/dL — ABNORMAL HIGH (ref 6–20)
CO2: 29 mmol/L (ref 22–32)
Calcium: 9.1 mg/dL (ref 8.9–10.3)
Chloride: 103 mmol/L (ref 101–111)
Creatinine, Ser: 1.35 mg/dL — ABNORMAL HIGH (ref 0.61–1.24)
GFR calc Af Amer: >60 mL/min (ref >60)
GFR, EST NON AFRICAN AMERICAN: 59 mL/min — AB (ref >60)
Glucose, Bld: 129 mg/dL — ABNORMAL HIGH (ref 65–99)
POTASSIUM: 4.4 mmol/L (ref 3.5–5.1)
SODIUM: 136 mmol/L (ref 135–145)

## 2015-08-08 LAB — GLUCOSE, CAPILLARY
GLUCOSE-CAPILLARY: 139 mg/dL — AB (ref 65–99)
GLUCOSE-CAPILLARY: 145 mg/dL — AB (ref 65–99)
GLUCOSE-CAPILLARY: 201 mg/dL — AB (ref 65–99)
Glucose-Capillary: 127 mg/dL — ABNORMAL HIGH (ref 65–99)

## 2015-08-08 NOTE — Care Management Note (Signed)
Case Management Note  Patient Details  Name: Willie Walker MRN: [id number] Date of Birth: 08/09/1965  Subjective/Objective:    Pt presented for chest pain. Post cardiac cath revealed- Critical 3 vessel obstructive CAD.  CT surgery consult for CABG-plan for 08-05-15. Pt is from home with his mother. Pt has fiancee. [facility]: Quentin Angstlugbemiga E Jegede, MD. Per pt he is able to get his medications at the clinic pharmacy. Financial Counselor is following the patient due to no insurance.                Action/Plan: CM will continue to monitor for disposition needs post procedure.    Expected Discharge Date:                  Expected Discharge Plan:  Home/Self Care  In-House Referral:     Discharge planning Services  CM Consult  Post Acute Care Choice:    Choice offered to:     DME Arranged:    DME Agency:     HH Arranged:    HH Agency:     Status of Service:  In process, will continue to follow  If discussed at Long Length of Stay Meetings, dates discussed:    Additional Comments:  08/08/15- 1600- Donn PieriniKristi Annya Lizana RN, CM- pt tx from ICU to 2W on 7/19- s/p CABG x5- post op day 3 today- has SO that will be available at discharge- plan to remove EPW today- CM to continue to follow.   [facility]19[facility], CM- Pt states he lives with his mother who has dementia.  Significant other is @ bedside and states she will be available to provide 24/7 assistance when pt is discharged.   Willie Walker, Willie Walker Hall, RN 08/08/2015, 3:58 PM

## 2015-08-08 NOTE — Progress Notes (Addendum)
      [address]       [place] [id number]             [phone]      3 Days Post-Op Procedure(s) (LRB): CORONARY ARTERY BYPASS GRAFTING (CABG), ON PUMP, TIMES FIVE, USING LEFT INTERNAL MAMMARY ARTERY AND LEFT RADIAL ARTERY, RIGHT GREATER SAPHENOUS VEIN HARVESTED ENDOSCOPICALLY (N/A) RADIAL ARTERY HARVEST (Left) TRANSESOPHAGEAL ECHOCARDIOGRAM (TEE) (N/A)   Subjective:  Mr. Willie Walker complains of pain on his right side.  Its located below his ribs to his flank.  He also would like his neck line removed if possible.  Objective: Vital signs in last 24 hours: Temp:  [98.4 F (36.9 C)-99.5 F (37.5 C)] 98.6 F (37 C) (07/20 0358) Pulse Rate:  [70-98] 84 (07/20 0358) Cardiac Rhythm:  [-] Normal sinus rhythm (07/19 2005) Resp:  [18-28] 20 (07/20 0358) BP: (84-153)/(63-99) 142/84 mmHg (07/20 0358) SpO2:  [93 %-98 %] 94 % (07/20 0358) Weight:  [158 lb 6.4 oz (71.85 kg)] 158 lb 6.4 oz (71.85 kg) (07/20 0358)  Intake/Output from previous day: 07/19 0701 - 07/20 0700 In: 1080 [P.O.:1080] Out: 825 [Urine:825]  General appearance: alert, cooperative and no distress Heart: regular rate and rhythm, + rub Lungs: clear to auscultation bilaterally Abdomen: soft, non-tender; bowel sounds normal; no masses,  no organomegaly Extremities: edema none appreciated Wound: clean and dry  Lab Results:  Recent Labs  08/07/15 0500 08/08/15 0416  WBC 11.2* 11.5*  HGB 8.7* 8.8*  HCT 26.4* 26.8*  PLT 148* 186   BMET:  Recent Labs  08/07/15 0500 08/08/15 0416  NA 134* 136  K 4.5 4.4  CL 101 103  CO2 28 29  GLUCOSE 98 129*  BUN 18 21*  CREATININE 1.29* 1.35*  CALCIUM 8.8* 9.1    PT/INR:  Recent Labs  08/05/15 1405  LABPROT 18.7*  INR 1.56*   ABG    Component Value Date/Time   PHART 7.317* 08/05/2015 2059   HCO3 21.0 08/05/2015 2059   TCO2 25 08/06/2015 1555   ACIDBASEDEF 5.0* 08/05/2015 2059   O2SAT 99.0 08/05/2015 2059   CBG (last 3)   Recent Labs  08/07/15 1642 08/07/15 2112 08/08/15 0624  GLUCAP 130* 143* 139*    Assessment/Plan: S/P Procedure(s) (LRB): CORONARY ARTERY BYPASS GRAFTING (CABG), ON PUMP, TIMES FIVE, USING LEFT INTERNAL MAMMARY ARTERY AND LEFT RADIAL ARTERY, RIGHT GREATER SAPHENOUS VEIN HARVESTED ENDOSCOPICALLY (N/A) RADIAL ARTERY HARVEST (Left) TRANSESOPHAGEAL ECHOCARDIOGRAM (TEE) (N/A)  1. CV- NSR, + HTN- will increase Lopressor to 25 mg BID, Lisinopril, Imdur to radial graft.... Colchicine for Pericarditis 2. Pulm- no acute issues, continue IS 3. Renal- creatinine at 1.35, relatively stable, weight trendind down not on Lasix 4. GI- CXR with free air of diaphragm, abdominal exam is benign.... Will get ABD series per radiology, but most likely related to chest tube placement 5. DM- cbgs controlled, will continue insulin for now as creatinine is mildly elevated 6. Dispo- patient stable, get ABD series to further evaluate free air, increase lopressor for HTN, watch creatinine, d/c EPW   LOS: 7 days    Raford PitcherBARRETT, ERIN 08/08/2015  Patient seen and examined, agree with above Just got back from walking about 500' Denies any abdominal or flank pain, nausea or vomiting Abdomen is soft and nontender I agree free air likely due to transperitoneal chest tube  Viviann SpareSteven C. Dorris FetchHendrickson, MD [facility] [phone]

## 2015-08-08 NOTE — Progress Notes (Signed)
08/08/2015 12:28 PM EPW D/C'd per order and per protocol.  Ends intact. Pt. Tolerated well.  Advised bedrest x1hr.  Call bell in reach.  Vital signs collected per protocol. Kathryne HitchAllen, Kaitrin Seybold C

## 2015-08-08 NOTE — Progress Notes (Signed)
CARDIAC REHAB PHASE I   PRE:  Rate/Rhythm: 96 SR  BP:  Supine:   Sitting: 124/73  Standing:    SaO2: 93%RA  MODE:  Ambulation: 890 ft   POST:  Rate/Rhythm: 99  BP:  Supine:   Sitting: 110/66  Standing:    SaO2: 90-91%RA 1402-1458 Pt walked 890 ft with steady gait. Tolerated well. Education completed with pt and girl friend who voiced understanding. Discussed CRP 2 and will refer to [place]. Discussed sternal precautions and IS use. Has seen discharge video. Gave heart healthy and diabetic diets and discussed carb counting. Pt stated no more drug use.   Willie Nuttingharlene Raydan Schlabach, RN BSN  08/08/2015 2:52 PM

## 2015-08-09 LAB — GLUCOSE, CAPILLARY
GLUCOSE-CAPILLARY: 146 mg/dL — AB (ref 65–99)
GLUCOSE-CAPILLARY: 163 mg/dL — AB (ref 65–99)
Glucose-Capillary: 131 mg/dL — ABNORMAL HIGH (ref 65–99)
Glucose-Capillary: 119 mg/dL — ABNORMAL HIGH (ref 65–99)
Glucose-Capillary: 99 mg/dL (ref 65–99)

## 2015-08-09 LAB — BASIC METABOLIC PANEL
Anion gap: 6 (ref 5–15)
BUN: 23 mg/dL — ABNORMAL HIGH (ref 6–20)
CHLORIDE: 102 mmol/L (ref 101–111)
CO2: 29 mmol/L (ref 22–32)
CREATININE: 1.54 mg/dL — AB (ref 0.61–1.24)
Calcium: 9.2 mg/dL (ref 8.9–10.3)
GFR calc non Af Amer: 50 mL/min — ABNORMAL LOW (ref >60)
GFR, EST AFRICAN AMERICAN: 58 mL/min — AB (ref >60)
GLUCOSE: 128 mg/dL — AB (ref 65–99)
Potassium: 4.4 mmol/L (ref 3.5–5.1)
Sodium: 137 mmol/L (ref 135–145)

## 2015-08-09 MED ORDER — METOPROLOL TARTRATE 25 MG PO TABS
25.0000 mg | ORAL_TABLET | Freq: Two times a day (BID) | ORAL | Status: DC
  Administered 2015-08-09 – 2015-08-10 (×3): 25 mg via ORAL
  Filled 2015-08-09 (×3): qty 1

## 2015-08-09 MED ORDER — METOPROLOL TARTRATE 25 MG/10 ML ORAL SUSPENSION: 25.0000 mg | Freq: Two times a day (BID) | ORAL | Status: DC

## 2015-08-09 NOTE — Discharge Summary (Signed)
Physician Discharge Summary  Patient ID: Willie Walker MRN: [id number] DOB/AGE: May 14, 1965 50 y.o.  Admit date: 08/01/2015 Discharge date: 08/10/2015  Admission Diagnoses:  Patient Active Problem List   Diagnosis Date Noted  . Type 2 diabetes mellitus without complication, without long-term current use of insulin (HCC) 07/25/2015  . Renal cyst 07/25/2015  . Essential hypertension, benign 11/01/2014  . Primary gout 11/01/2014  . Polyneuropathy in diabetes(357.2) 07/28/2012  . Diabetic foot ulcer associated with type 2 diabetes mellitus (HCC) 07/28/2012  . Disc disease, degenerative, lumbar or lumbosacral 07/28/2012  . Heel ulcer, right 06/20/2012  . Type II or unspecified type diabetes mellitus without mention of complication, not stated as uncontrolled 06/20/2012  . Erectile dysfunction associated with type 2 diabetes mellitus (HCC) 03/10/2012  . CAD (coronary artery disease) 01/08/2012  . Pancreatitis 01/08/2012  . Unstable angina (HCC) 09/19/2011  . Type 2 diabetes mellitus (HCC) 09/09/2011  . Mixed hyperlipidemia 09/09/2011  . HTN (hypertension) 09/09/2011  . ELECTROCARDIOGRAM, ABNORMAL 12/12/2008   Discharge Diagnoses:   Patient Active Problem List   Diagnosis Date Noted  . 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  . Type 2 diabetes mellitus without complication, without long-term current use of insulin (HCC) 07/25/2015  . Renal cyst 07/25/2015  . Essential hypertension, benign 11/01/2014  . Primary gout 11/01/2014  . Polyneuropathy in diabetes(357.2) 07/28/2012  . Diabetic foot ulcer associated with type 2 diabetes mellitus (HCC) 07/28/2012  . Disc disease, degenerative, lumbar or lumbosacral 07/28/2012  . Heel ulcer, right 06/20/2012  . Type II or unspecified type diabetes mellitus without mention of complication, not stated as uncontrolled 06/20/2012  . Erectile dysfunction associated with type 2 diabetes mellitus (HCC) 03/10/2012  . CAD (coronary artery  disease) 01/08/2012  . Pancreatitis 01/08/2012  . Unstable angina (HCC) 09/19/2011  . Type 2 diabetes mellitus (HCC) 09/09/2011  . Mixed hyperlipidemia 09/09/2011  . HTN (hypertension) 09/09/2011  . ELECTROCARDIOGRAM, ABNORMAL 12/12/2008   Discharged Condition: good  History of Present Illness:  [name] is a 50 yo African American male with known history of CAD S/p multiple prior interventions, HTN, Hyperlipidemia, Diabetes, and Polysubstance abuse ( marijuana and cocaine).  The patient was in his usual state of health until about 6 weeks ago at which time he developed substernal chest pain with exertion.  Over the past 6 week it has became more frequent, with less exertion and is becoming more severe.  He also has long standing intermittent left sided chest pain which has worsened lately.  These episodes are occasionally associated with shortness of breath and diaphoresis.  He underwent workup via Cardiology which included cardiac catheterization which revealed 3 vessel CAD which was no amenable to PCI.  It was felt coronary bypass grafting would be indicated. He was referred to [facility] and evaluated by Dr. Dorris Fetch for surgical evaluation.  He also recommended surgical revascularization.  The risks and benefits of the procedure were explained to the patient and he was agreeable to proceed.   Hospital Course:   [name] presented to [facility] on 08/05/2015.  He underwent CABG x 5 utilizing LIMA to LAD, SVG to Diagonal, Sequential SVG to Ramus Intermediate and OM, and Radial artery to the PDA.  He also underwent open harvest of his left radial artery and endoscopic harvest of greater saphenous vein from right thigh.  He tolerated the procedure without difficulty and was taken to the SICU in stable condition.  He was weaned and extubated the evening of surgery.  During his stay in the SICU the patient was started on colchicine for believed pericarditis.  His chest tubes and arterial  lines were removed without difficulty.  He was started on Imdur for his radial graft.  He was aggressively diuresed for hypervolemia.  He was maintaining NSR and transferred to the telemetry unit on POD #2.  The patient continues to make progress.  His CXR showed evidence of free air under his diaphragm on the right side.  Abdominal exam was benign.  Abdominal series confirmed the presence of free air under the diaphragm, however it was felt this was likely due to the chest tube that was placed.  He continues to maintain NSR.  His pacing wires were removed without difficulty.  His creatinine was trending upward.  He was taken off ACE inhibitor due to this.  This leveled off and is his most recent creatinine was 1.45.  He continues to ambulate without difficulty.  He is tolerating a regular diet.  He is felt medically stable for discharge home today.    Significant Diagnostic Studies: angiography:    Ost RCA to Mid RCA lesion, 40% stenosed. The lesion was previously treated with a drug-eluting stent greater than two years ago.  Mid RCA to Dist RCA lesion, 100% stenosed. The lesion was previously treated with a drug-eluting stent greater than two years ago.  Acute Mrg lesion, 90% stenosed.  Ramus lesion, 40% stenosed. The lesion was previously treated with a drug-eluting stent greater than two years ago.  Prox Cx to Mid Cx lesion, 100% stenosed. The lesion was previously treated with a drug-eluting stent greater than two years ago.  Ost 2nd Diag to 2nd Diag lesion, 70% stenosed.  Mid LAD to Dist LAD lesion, 50% stenosed.  Prox LAD to Mid LAD lesion, 95% stenosed.  The left ventricular systolic function is normal.  1. Critical 3 vessel obstructive CAD 2. Normal LV function  Treatments: surgery:   Median sternotomy, extracorporeal circulation, coronary artery bypass grafting x5 (left internal mammary artery to left anterior descending, saphenous vein graft to diagonal, sequential saphenous  vein graft to ramus intermedius and obtuse marginal, saphenous vein graft to posterior descending), endoscopic vein harvest, right thigh.  Disposition: 01-Home or Self Care   Discharge Medications:  The patient has been discharged on:   1.Beta Blocker:  Yes [x   ]                              No   [   ]                              If No, reason:  2.Ace Inhibitor/ARB: Yes [   ]                                     No  [  x  ]                                     If No, reason: Elevated Creatinine  3.Statin:   Yes [ x  ]                  No  [   ]  If No, reason:  4.Ecasa:  Yes  [ x  ]                  No   [   ]                  If No, reason:    Medication List    STOP taking these medications        lisinopril 10 MG tablet  Commonly known as:  PRINIVIL,ZESTRIL      TAKE these medications        acetaminophen 500 MG tablet  Commonly known as:  TYLENOL  Take 2 tablets (1,000 mg total) by mouth every 6 (six) hours as needed for mild pain or fever.     allopurinol 100 MG tablet  Commonly known as:  ZYLOPRIM  Take 1 tablet (100 mg total) by mouth daily.     aspirin 325 MG EC tablet  Take 1 tablet (325 mg total) by mouth daily.     atorvastatin 80 MG tablet  Commonly known as:  LIPITOR  Take 1 tablet (80 mg total) by mouth daily at 6 PM.     colchicine 0.6 MG tablet  Take 1 tablet (0.6 mg total) by mouth 2 (two) times daily. For 3 days     fish oil-omega-3 fatty acids 1000 MG capsule  Take 1 g by mouth 2 (two) times daily.     metFORMIN 500 MG tablet  Commonly known as:  GLUCOPHAGE  Take 1 tablet (500 mg total) by mouth 2 (two) times daily with a meal.     metoprolol tartrate 25 MG tablet  Commonly known as:  LOPRESSOR  Take 1 tablet (25 mg total) by mouth 2 (two) times daily.     multivitamin with minerals tablet  Take 1 tablet by mouth daily.     nitroGLYCERIN 0.4 MG SL tablet  Commonly known as:  NITROSTAT  Place 1 tablet (0.4 mg  total) under the tongue every 5 (five) minutes x 3 doses as needed for chest pain.     oxyCODONE 5 MG immediate release tablet  Commonly known as:  Oxy IR/ROXICODONE  Take 1-2 tablets (5-10 mg total) by mouth every 3 (three) hours as needed for severe pain.       Follow-up Information    Follow up with Loreli Slot, MD On 09/10/2015.   Specialty:  Cardiothoracic Surgery   Why:  Appointment is at 4:00   Contact information:   [address] [place] [address] [phone]       Follow up with [facility] On 09/10/2015.   Why:  Please get CXR at 3:30, located on first floor of our office building   Contact information:   [place]       Follow up with Charlton Haws, MD.   Specialty:  Cardiology   Why:  office will contact you to arrange followup, please give [place] a call if you do not hear from [place] in 2-3 business days.   Contact information:   [address] [place] [address] [phone]       Follow up with Jeanann Lewandowsky, MD.   Specialty:  Internal Medicine   Why:  please follow up in next 2-3 weeks    Contact information:   [address] [place] [address] [phone]       Signed: Lowella Dandy 08/10/2015, 7:42 AM

## 2015-08-09 NOTE — Progress Notes (Addendum)
      [address]       [place] [id number]             [phone]      4 Days Post-Op Procedure(s) (LRB): CORONARY ARTERY BYPASS GRAFTING (CABG), ON PUMP, TIMES FIVE, USING LEFT INTERNAL MAMMARY ARTERY AND LEFT RADIAL ARTERY, RIGHT GREATER SAPHENOUS VEIN HARVESTED ENDOSCOPICALLY (N/A) RADIAL ARTERY HARVEST (Left) TRANSESOPHAGEAL ECHOCARDIOGRAM (TEE) (N/A)   Subjective:  Willie Walker has no new complaints this morning.  States his pain on the right side is better.  No BM yet  Objective: Vital signs in last 24 hours: Temp:  [97.5 F (36.4 C)-98.3 F (36.8 C)] 98.3 F (36.8 C) (07/21 0452) Pulse Rate:  [76-149] 76 (07/21 0452) Cardiac Rhythm:  [-] Normal sinus rhythm (07/20 2100) Resp:  [18] 18 (07/21 0452) BP: (108-137)/(54-90) 119/82 mmHg (07/21 0452) SpO2:  [91 %-100 %] 96 % (07/21 0452) Weight:  [155 lb (70.308 kg)] 155 lb (70.308 kg) (07/21 0147)  Intake/Output from previous day: 07/20 0701 - 07/21 0700 In: 723 [P.O.:720; I.V.:3] Out: 950 [Urine:950]  General appearance: alert, cooperative and no distress Heart: regular rate and rhythm Lungs: clear to auscultation bilaterally Abdomen: soft, non-tender; bowel sounds normal; no masses,  no organomegaly Extremities: edema none appreciated Wound: clean and dry  Lab Results:  Recent Labs  08/07/15 0500 08/08/15 0416  WBC 11.2* 11.5*  HGB 8.7* 8.8*  HCT 26.4* 26.8*  PLT 148* 186   BMET:  Recent Labs  08/08/15 0416 08/09/15 0441  NA 136 137  K 4.4 4.4  CL 103 102  CO2 29 29  GLUCOSE 129* 128*  BUN 21* 23*  CREATININE 1.35* 1.54*  CALCIUM 9.1 9.2    PT/INR: No results for input(s): LABPROT, INR in the last 72 hours. ABG    Component Value Date/Time   PHART 7.317* 08/05/2015 2059   HCO3 21.0 08/05/2015 2059   TCO2 25 08/06/2015 1555   ACIDBASEDEF 5.0* 08/05/2015 2059   O2SAT 99.0 08/05/2015 2059   CBG (last 3)   Recent Labs  08/08/15 2140 08/09/15 0142 08/09/15 0607  GLUCAP  201* 131* 119*    Assessment/Plan: S/P Procedure(s) (LRB): CORONARY ARTERY BYPASS GRAFTING (CABG), ON PUMP, TIMES FIVE, USING LEFT INTERNAL MAMMARY ARTERY AND LEFT RADIAL ARTERY, RIGHT GREATER SAPHENOUS VEIN HARVESTED ENDOSCOPICALLY (N/A) RADIAL ARTERY HARVEST (Left) TRANSESOPHAGEAL ECHOCARDIOGRAM (TEE) (N/A)  1. CV- NSR, will increase Lopressor to 25 mg BID... Stop Lisinopril with creatine rising 2. Pulm- no acute issues, continue IS 3. Renal- creatinine trending up, back up to 1.54, will stop ACE, not on Lasix, no hypervolemia 4. GI-exam remains benign, free air present on abd films... Likely related to chest tube, no further workup indicated 5. D/C [place] 6. DM- sugars controlled on SSIP, cant restart Metformin with rising creatining 7. Dispo- patient stable, creatinine up to 1.56... Stop ACE, abdominal exam remains benign... Per patient for discharge today, however should monitor creatinine as is rising... But will discuss discharge with Dr. Dorris Walker   LOS: 8 days    Willie DandyBARRETT, Willie Walker 08/09/2015  Patient seen and examined, agree with above His baseline creatinine is about 1.5- 1.6. He was lower than that with hydration around the time of cath. Recheck in AM Probably home in AM  Willie C. Dorris FetchHendrickson, MD [facility] [phone]

## 2015-08-09 NOTE — Discharge Instructions (Signed)
Coronary Artery Bypass Grafting, Care After °Refer to this sheet in the next few weeks. These instructions provide you with information on caring for yourself after your procedure. Your health care provider may also give you more specific instructions. Your treatment has been planned according to current medical practices, but problems sometimes occur. Call your health care provider if you have any problems or questions after your procedure. °WHAT TO EXPECT AFTER THE PROCEDURE °Recovery from surgery will be different for everyone. Some people feel well after 3 or 4 weeks, while for others it takes longer. After your procedure, it is typical to have the following: °· Nausea and a lack of appetite.   °· Constipation. °· Weakness and fatigue.   °· Depression or irritability.   °· Pain or discomfort at your incision site. °HOME CARE INSTRUCTIONS °· Take medicines only as directed by your health care provider. Do not stop taking medicines or start any new medicines without first checking with your health care provider. °· Take your pulse as directed by your health care provider. °· Perform deep breathing as directed by your health care provider. If you were given a device called an incentive spirometer, use it to practice deep breathing several times a day. Support your chest with a pillow or your arms when you take deep breaths or cough. °· Keep incision areas clean, dry, and protected. Remove or change any bandages (dressings) only as directed by your health care provider. You may have skin adhesive strips over the incision areas. Do not take the strips off. They will fall off on their own. °· Check incision areas daily for any swelling, redness, or drainage. °· If incisions were made in your legs, do the following: °¨ Avoid crossing your legs.   °¨ Avoid sitting for long periods of time. Change positions every 30 minutes.   °¨ Elevate your legs when you are sitting. °· Wear compression stockings as directed by your  health care provider. These stockings help keep blood clots from forming in your legs. °· Take showers once your health care provider approves. Until then, only take sponge baths. Pat incisions dry. Do not rub incisions with a washcloth or towel. Do not take baths, swim, or use a hot tub until your health care provider approves. °· Eat foods that are high in fiber, such as raw fruits and vegetables, whole grains, beans, and nuts. Meats should be lean cut. Avoid canned, processed, and fried foods. °· Drink enough fluid to keep your urine clear or pale yellow. °· Weigh yourself every day. This helps identify if you are retaining fluid that may make your heart and lungs work harder. °· Rest and limit activity as directed by your health care provider. You may be instructed to: °¨ Stop any activity at once if you have chest pain, shortness of breath, irregular heartbeats, or dizziness. Get help right away if you have any of these symptoms. °¨ Move around frequently for short periods or take short walks as directed by your health care provider. Increase your activities gradually. You may need physical therapy or cardiac rehabilitation to help strengthen your muscles and build your endurance. °¨ Avoid lifting, pushing, or pulling anything heavier than 10 lb (4.5 kg) for at least 6 weeks after surgery. °· Do not drive until your health care provider approves.  °· Ask your health care provider when you may return to work. °· Ask your health care provider when you may resume sexual activity. °· Keep all follow-up visits as directed by your health care   provider. This is important. °SEEK MEDICAL CARE IF: °· You have swelling, redness, increasing pain, or drainage at the site of an incision. °· You have a fever. °· You have swelling in your ankles or legs. °· You have pain in your legs.   °· You gain 2 or more pounds (0.9 kg) a day. °· You are nauseous or vomit. °· You have diarrhea.  °SEEK IMMEDIATE MEDICAL CARE IF: °· You have  chest pain that goes to your jaw or arms. °· You have shortness of breath.   °· You have a fast or irregular heartbeat.   °· You notice a "clicking" in your breastbone (sternum) when you move.   °· You have numbness or weakness in your arms or legs. °· You feel dizzy or light-headed.   °MAKE SURE YOU: °· Understand these instructions. °· Will watch your condition. °· Will get help right away if you are not doing well or get worse. °  °This information is not intended to replace advice given to you by your health care provider. Make sure you discuss any questions you have with your health care provider. °  °Document Released: 07/25/2004 Document Revised: 01/26/2014 Document Reviewed: 06/14/2012 °Elsevier Interactive Patient Education ©2016 [employer] ° °Endoscopic Saphenous Vein Harvesting, Care After °Refer to this sheet in the next few weeks. These instructions provide you with information on caring for yourself after your procedure. Your health care provider may also give you more specific instructions. Your treatment has been planned according to current medical practices, but problems sometimes occur. Call your health care provider if you have any problems or questions after your procedure. °HOME CARE INSTRUCTIONS °Medicine °· Take whatever pain medicine your surgeon prescribes. Follow the directions carefully. Do not take over-the-counter pain medicine unless your surgeon says it is okay. Some pain medicine can cause bleeding problems for several weeks after surgery. °· Follow your surgeon's instructions about driving. You will probably not be permitted to drive after heart surgery. °· Take any medicines your surgeon prescribes. Any medicines you took before your heart surgery should be checked with your health care provider before you start taking them again. °Wound care °· If your surgeon has prescribed an elastic bandage or stocking, ask how long you should wear it. °· Check the area around your surgical  cuts (incisions) whenever your bandages (dressings) are changed. Look for any redness or swelling. °· You will need to return to have the stitches (sutures) or staples taken out. Ask your surgeon when to do that. °· Ask your surgeon when you can shower or bathe. °Activity °· Try to keep your legs raised when you are sitting. °· Do any exercises your health care providers have given you. These may include deep breathing exercises, coughing, walking, or other exercises. °SEEK MEDICAL CARE IF: °· You have any questions about your medicines. °· You have more leg pain, especially if your pain medicine stops working. °· New or growing bruises develop on your leg. °· Your leg swells, feels tight, or becomes red. °· You have numbness in your leg. °SEEK IMMEDIATE MEDICAL CARE IF: °· Your pain gets much worse. °· Blood or fluid leaks from any of the incisions. °· Your incisions become warm, swollen, or red. °· You have chest pain. °· You have trouble breathing. °· You have a fever. °· You have more pain near your leg incision. °MAKE SURE YOU: °· Understand these instructions. °· Will watch your condition. °· Will get help right away if you are not doing well or   get worse. °  °This information is not intended to replace advice given to you by your health care provider. Make sure you discuss any questions you have with your health care provider. °  °Document Released: 09/17/2010 Document Revised: 01/26/2014 Document Reviewed: 09/17/2010 °Elsevier Interactive Patient Education ©2016 [employer] Inc[employer] ° ° °

## 2015-08-09 NOTE — Progress Notes (Signed)
Central line d/c'd per order and per protocol. Pt tolerated well. Pt instructed to lie flat for 1 hour. Call bell and phone within reach. Will continue to monitor.

## 2015-08-10 LAB — BASIC METABOLIC PANEL
Anion gap: 6 (ref 5–15)
BUN: 26 mg/dL — AB (ref 6–20)
CHLORIDE: 101 mmol/L (ref 101–111)
CO2: 29 mmol/L (ref 22–32)
CREATININE: 1.45 mg/dL — AB (ref 0.61–1.24)
Calcium: 9.2 mg/dL (ref 8.9–10.3)
GFR calc Af Amer: >60 mL/min (ref >60)
GFR calc non Af Amer: 54 mL/min — ABNORMAL LOW (ref >60)
GLUCOSE: 124 mg/dL — AB (ref 65–99)
POTASSIUM: 4.4 mmol/L (ref 3.5–5.1)
SODIUM: 136 mmol/L (ref 135–145)

## 2015-08-10 LAB — GLUCOSE, CAPILLARY: GLUCOSE-CAPILLARY: 137 mg/dL — AB (ref 65–99)

## 2015-08-10 MED ORDER — OXYCODONE HCL 5 MG PO TABS: 5.0000 mg | ORAL_TABLET | ORAL | Status: DC | PRN

## 2015-08-10 MED ORDER — METOPROLOL TARTRATE 25 MG PO TABS: 25.0000 mg | ORAL_TABLET | Freq: Two times a day (BID) | ORAL | Status: DC

## 2015-08-10 MED ORDER — COLCHICINE 0.6 MG PO TABS: 0.6000 mg | ORAL_TABLET | Freq: Two times a day (BID) | ORAL | Status: DC

## 2015-08-10 MED ORDER — ACETAMINOPHEN 500 MG PO TABS: 1000.0000 mg | ORAL_TABLET | Freq: Four times a day (QID) | ORAL | Status: DC | PRN

## 2015-08-10 MED ORDER — ATORVASTATIN CALCIUM 80 MG PO TABS: 80.0000 mg | ORAL_TABLET | Freq: Every day | ORAL | Status: DC

## 2015-08-10 MED ORDER — ASPIRIN 325 MG PO TBEC: 325.0000 mg | DELAYED_RELEASE_TABLET | Freq: Every day | ORAL | Status: DC

## 2015-08-10 NOTE — Progress Notes (Signed)
      [address]       [place] [address]             [phone]      5 Days Post-Op Procedure(s) (LRB): CORONARY ARTERY BYPASS GRAFTING (CABG), ON PUMP, TIMES FIVE, USING LEFT INTERNAL MAMMARY ARTERY AND LEFT RADIAL ARTERY, RIGHT GREATER SAPHENOUS VEIN HARVESTED ENDOSCOPICALLY (N/A) RADIAL ARTERY HARVEST (Left) TRANSESOPHAGEAL ECHOCARDIOGRAM (TEE) (N/A)   Subjective:  Mr. Willie Walker has no complaints.  He is feeling great wants to go home.  +ambulation  + BM  Objective: Vital signs in last 24 hours: Temp:  [98.1 F (36.7 C)-98.3 F (36.8 C)] 98.1 F (36.7 C) (07/22 0422) Pulse Rate:  [67-88] 67 (07/22 0422) Cardiac Rhythm:  [-] Normal sinus rhythm (07/21 1904) Resp:  [18-19] 18 (07/22 0422) BP: (116-130)/(70-90) 130/82 mmHg (07/22 0422) SpO2:  [94 %-96 %] 94 % (07/22 0422) Weight:  [153 lb (69.4 kg)] 153 lb (69.4 kg) (07/22 0422)  Intake/Output from previous day: 07/21 0701 - 07/22 0700 In: 720 [P.O.:720] Out: -   General appearance: alert, cooperative and no distress Heart: regular rate and rhythm Lungs: clear to auscultation bilaterally Abdomen: soft, non-tender; bowel sounds normal; no masses,  no organomegaly Extremities: edema none appreciated Wound: clean and dry  Lab Results:  Recent Labs  08/08/15 0416  WBC 11.5*  HGB 8.8*  HCT 26.8*  PLT 186   BMET:  Recent Labs  08/09/15 0441 08/10/15 0223  NA 137 136  K 4.4 4.4  CL 102 101  CO2 29 29  GLUCOSE 128* 124*  BUN 23* 26*  CREATININE 1.54* 1.45*  CALCIUM 9.2 9.2    PT/INR: No results for input(s): LABPROT, INR in the last 72 hours. ABG    Component Value Date/Time   PHART 7.317* 08/05/2015 2059   HCO3 21.0 08/05/2015 2059   TCO2 25 08/06/2015 1555   ACIDBASEDEF 5.0* 08/05/2015 2059   O2SAT 99.0 08/05/2015 2059   CBG (last 3)   Recent Labs  08/09/15 1638 08/09/15 2207 08/10/15 0613  GLUCAP 163* 99 137*    Assessment/Plan: S/P Procedure(s) (LRB): CORONARY  ARTERY BYPASS GRAFTING (CABG), ON PUMP, TIMES FIVE, USING LEFT INTERNAL MAMMARY ARTERY AND LEFT RADIAL ARTERY, RIGHT GREATER SAPHENOUS VEIN HARVESTED ENDOSCOPICALLY (N/A) RADIAL ARTERY HARVEST (Left) TRANSESOPHAGEAL ECHOCARDIOGRAM (TEE) (N/A)  1. CV- NSR, BP controlled- continue Lopressor 2. Pulm- no acute issues, continue IS 3. Renal- creatinine down to 1.45 this morning.. Will continue to hold ACE 4. DM- sugars controlled, resume home metformin  5. Dispo- patient stable, will d/c home today   LOS: 9 days    Edward Trevino 08/10/2015

## 2015-08-10 NOTE — Progress Notes (Signed)
Chest tube sutures removed as ordered without complication.

## 2015-08-10 NOTE — Progress Notes (Signed)
Orders received to discharge patient.  Patient expresses readiness to discharge.  Discharge instructions, follow up, medications and instructions for their use were discussed with patient and patient voiced understanding.  Telemetry monitor was removed and CCMD notified.   

## 2015-08-16 ENCOUNTER — Ambulatory Visit (HOSPITAL_COMMUNITY)
Admission: RE | Admit: 2015-08-16 | Discharge: 2015-08-16 | Disposition: A | Discharge status: Home / Self Care | Payer: Self-pay | Source: Ambulatory Visit | Attending: Internal Medicine | Admitting: Internal Medicine

## 2015-08-16 DIAGNOSIS — N281 Cyst of kidney, acquired: Secondary | ICD-10-CM | POA: Insufficient documentation

## 2015-08-21 NOTE — Progress Notes (Signed)
Cardiology Office Note    Date:  08/22/2015   ID:  Willie Walker, DOB 08-18-1965, MRN [id number]  PCP:  Jeanann Lewandowsky, MD  Cardiologist: Dr. Eden Emms  CC: post hosp f/u - CABG  History of Present Illness:  Willie Walker is a 50 y.o. male with a history of CAD s/p multiple PCIs and CABG x5V (07/2015), HTN, HLD, PVD and ongoing polysubstance abuse who presents to clinic for post hospital follow up  History of CAD and multiple PCI procedures in the past. He had several stents placed in [place] with full metal jacket in the RCA and stent to the intermediate. LHC (8/292013) for chest pain showed:  dLM 30%, mLAD 60%, dLAD 40%, IM 30% ISR, D1 40-50%, OM/AV groove branch 95% ISR, pRCA 90% SIR, dRCA 80% ISR, EF 60%. Subsequently underwent cutting Balloon angioplasty to the mid RCA; Cutting Balloon angioplasty to the mid circumflex. Last seen by Dr. Eden Emms 12/2013. Per note patient patient was enrolled in STATUSFIRST trial 08/29/14.   He saw Vin Bhagat PA-C in the office on 07/31/15 for worsening chest pain. He continued to smoke marijuana and occasionally crack cocaine. He was set up for cath the following day with Dr. Swaziland. This revealed critical 3 vessel obstructive CAD and he was referred for CABG.  He was admitted from 7/13-7/22/17. He underwent CABG x5V utilizing LIMA to LAD, SVG to Diagonal, Sequential SVG to Ramus Intermediate and OM, and Radial artery to the PDA on 08/05/15 with Dr. Dorris Fetch. He also underwent open harvest of his left radial artery and endoscopic harvest of greater saphenous vein from right thigh. His hospital course was complicated by pericarditis and he was started on Colchicine as well as significant volume overload requiring diuresis. His creat did become elevated and Lisinopril was discontinued. Creat 1.45 at discharge.  Today he presents to clinic for follow up. He is still having chest pain, worse when laying flat and better leaning forward. He does not smoke.  He has not done any cocaine since admission and swears he will never do it again. Can't sleep due to pain. No LE edema. He does have some orthopnea and PND. He does have some mild dizziness but no syncope. He has been walking everyday with no chest pain or SOB, but he does have to take it slow. He is very irritated because he cannot sleep and this is making everything worse. He is a difficult historian and a little hard to follow.    Past Medical History:  Diagnosis Date  . Anginal pain (HCC)   . Anxiety   . Childhood asthma   . Chronic lower back pain   . Coronary artery disease   . ELECTROCARDIOGRAM, ABNORMAL   . GERD (gastroesophageal reflux disease)   . Gout   . Headache(784.0) 09/18/2011   "maybe twice/wk; pressure on the brain"  . High cholesterol   . Hypertension   . Kidney stones   . Lower GI bleed 09/18/2011   "clots and everything; not lately"  . Myocardial infarction (HCC) 10/2005  . Peripheral vascular disease (HCC)    LLE  . Pneumonia 1990's  . Seizures (HCC) 09/18/2011   "blanks out on me"/wife's report  . Shortness of breath 09/18/11   "@ rest; lying down; w/exertion"  . Type I diabetes mellitus (HCC)     Past Surgical History:  Procedure Laterality Date  . BURR HOLE OF CRANIUM  1999   "mugged"  . CARDIAC CATHETERIZATION N/A 08/01/2015   Procedure: Left  Heart Cath and Coronary Angiography;  Surgeon: Peter M Swaziland, MD;  Location: [facility] INVASIVE CV LAB;  Service: Cardiovascular;  Laterality: N/A;  . CORONARY ANGIOPLASTY  09/18/2011  . CORONARY ANGIOPLASTY WITH STENT PLACEMENT  10/2005   "9"  . CORONARY ARTERY BYPASS GRAFT N/A 08/05/2015   Procedure: CORONARY ARTERY BYPASS GRAFTING (CABG), ON PUMP, TIMES FIVE, USING LEFT INTERNAL MAMMARY ARTERY AND LEFT RADIAL ARTERY, RIGHT GREATER SAPHENOUS VEIN HARVESTED ENDOSCOPICALLY;  Surgeon: Loreli Slot, MD;  Location[facility] OR;  Service: Open Heart Surgery;  Laterality: N/A;  . LACERATION REPAIR  1999   BLE "mugging"  .  PERCUTANEOUS CORONARY STENT INTERVENTION (PCI-S) N/A 09/18/2011   Procedure: PERCUTANEOUS CORONARY STENT INTERVENTION (PCI-S);  Surgeon: Peter M Swaziland, MD;  Location: [facility] CATH LAB;  Service: Cardiovascular;  Laterality: N/A;  . RADIAL ARTERY HARVEST Left 08/05/2015   Procedure: RADIAL ARTERY HARVEST;  Surgeon: Loreli Slot, MD;  Location: [facility] OR;  Service: Open Heart Surgery;  Laterality: Left;  . TEE WITHOUT CARDIOVERSION N/A 08/05/2015   Procedure: TRANSESOPHAGEAL ECHOCARDIOGRAM (TEE);  Surgeon: Loreli Slot, MD;  Location: [facility] OR;  Service: Open Heart Surgery;  Laterality: N/A;    Current Medications: Outpatient Medications Prior to Visit  Medication Sig Dispense Refill  . acetaminophen (TYLENOL) 500 MG tablet Take 2 tablets (1,000 mg total) by mouth every 6 (six) hours as needed for mild pain or fever. 30 tablet 0  . allopurinol (ZYLOPRIM) 100 MG tablet Take 1 tablet (100 mg total) by mouth daily. 30 tablet 6  . aspirin EC 325 MG EC tablet Take 1 tablet (325 mg total) by mouth daily. 30 tablet 0  . atorvastatin (LIPITOR) 80 MG tablet Take 1 tablet (80 mg total) by mouth daily at 6 PM. 30 tablet 3  . fish oil-omega-3 fatty acids 1000 MG capsule Take 1 g by mouth 2 (two) times daily.    . metFORMIN (GLUCOPHAGE) 500 MG tablet Take 1 tablet (500 mg total) by mouth 2 (two) times daily with a meal. (Patient taking differently: Take 500 mg by mouth daily with breakfast. ) 180 tablet 3  . metoprolol tartrate (LOPRESSOR) 25 MG tablet Take 1 tablet (25 mg total) by mouth 2 (two) times daily. 60 tablet 3  . Multiple Vitamins-Minerals (MULTIVITAMIN WITH MINERALS) tablet Take 1 tablet by mouth daily.    . nitroGLYCERIN (NITROSTAT) 0.4 MG SL tablet Place 1 tablet (0.4 mg total) under the tongue every 5 (five) minutes x 3 doses as needed for chest pain. 25 tablet 3  . oxyCODONE (OXY IR/ROXICODONE) 5 MG immediate release tablet Take 1-2 tablets (5-10 mg total) by mouth every 3 (three) hours as  needed for severe pain. 30 tablet 0  . colchicine 0.6 MG tablet Take 1 tablet (0.6 mg total) by mouth 2 (two) times daily. For 3 days 6 tablet 0   No facility-administered medications prior to visit.      Allergies:   Crestor [rosuvastatin]   Social History   Social History  . Marital status: Divorced    Spouse name: N/A  . Number of children: N/A  . Years of education: N/A   Social History Main Topics  . Smoking status: Former Smoker    Packs/day: 0.50    Years: 20.00    Types: Cigarettes    Quit date: 11/02/2005  . Smokeless tobacco: Never Used  . Alcohol use 2.4 oz/week    4 Shots of liquor per week     Comment: 08/01/2015  "coulple  shots a couple times/week "  . Drug use:     Types: Cocaine, Marijuana, "Crack" cocaine     Comment: 08/01/2015 "did some coke 07/23/2015; nothing since 2010 before that"  . Sexual activity: Not Currently   Other Topics Concern  . None   Social History Narrative  . None     Family History:  The patient's family history includes CVA in his father; Dementia in his mother; Heart attack in his maternal grandmother; Hypertension in his mother.     ROS:   Please see the history of present illness.    ROS All other systems reviewed and are negative.   PHYSICAL EXAM:   VS:  BP 134/74   Pulse 70   Ht 5\' 11"  (1.803 m)   Wt 159 lb (72.1 kg)   BMI 22.18 kg/m    GEN: Well nourished, well developed, in no acute distress  HEENT: normal  Neck: no JVD, carotid bruits, or masses Cardiac: RRR; no murmurs, rubs, or gallops,no edema  Respiratory:  clear to auscultation bilaterally, normal work of breathing GI: soft, nontender, nondistended, + BS MS: no deformity or atrophy  Skin: warm and dry, no rash Neuro:  Alert and Oriented x 3, Strength and sensation are intact Psych: euthymic mood, full affect  Wt Readings from Last 3 Encounters:  08/22/15 159 lb (72.1 kg)  08/10/15 153 lb (69.4 kg)  07/31/15 159 lb (72.1 kg)      Studies/Labs  Reviewed:   EKG:  EKG is ordered today.  The ekg ordered today demonstrates NSR HR 70 with non specific ST/TW abnormalities.   Recent Labs: 07/31/2015: ALT 15 08/06/2015: Magnesium 2.4 08/08/2015: Hemoglobin 8.8; Platelets 186 08/10/2015: BUN 26; Creatinine, Ser 1.45; Potassium 4.4; Sodium 136   Lipid Panel    Component Value Date/Time   CHOL 232 (H) 07/31/2015 1051   TRIG 101 07/31/2015 1051   HDL 53 07/31/2015 1051   CHOLHDL 4.4 07/31/2015 1051   VLDL 20 07/31/2015 1051   LDLCALC 159 (H) 07/31/2015 1051    Additional studies/ records that were reviewed today include:  LHC 08/01/15 Conclusion   Ost RCA to Mid RCA lesion, 40% stenosed. The lesion was previously treated with a drug-eluting stent greater than two years ago.  Mid RCA to Dist RCA lesion, 100% stenosed. The lesion was previously treated with a drug-eluting stent greater than two years ago.  Acute Mrg lesion, 90% stenosed.  Ramus lesion, 40% stenosed. The lesion was previously treated with a drug-eluting stent greater than two years ago.  Prox Cx to Mid Cx lesion, 100% stenosed. The lesion was previously treated with a drug-eluting stent greater than two years ago.  Ost 2nd Diag to 2nd Diag lesion, 70% stenosed.  Mid LAD to Dist LAD lesion, 50% stenosed.  Prox LAD to Mid LAD lesion, 95% stenosed.  The left ventricular systolic function is normal.   1. Critical 3 vessel obstructive CAD 2. Normal LV function  Plan: Admit telemetry. Start IV heparin and Ntg. CT surgery consult for CABG.    2D ECHO: 08/01/2015 LV EF: 45% -   50% Study Conclusions - Left ventricle: The cavity size was normal. Wall thickness was   normal. Systolic function was mildly reduced. The estimated   ejection fraction was in the range of 45% to 50%. Diffuse   hypokinesis. Left ventricular diastolic function parameters were   normal. - Aorta: Aortic root dimension: 38 mm (ED). - Ascending aorta: The ascending aorta was mildly  dilated.  ASSESSMENT & PLAN:   CAD: s/p multiple PCI procedures in the past and now CAGB x5V. Continue ASA 325mg  daily, statin and BB.   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 Continue colchicine (only discharged with 3 days so will give another 2 month supply).   Polysubstance abuse: long discussion about this. He has totally sworn off cocaine.   HLD: LDL 154 on most recent lipid panel. Goal <70. Recently started on atorvastatin 80mg  daily. Will recheck lipid panel at next appointment to see if closer to goal.   DMT2: HgA1c 5.9 on 08/04/15.  Mild LV dysfunction: EF 45-50%. patient complains of orthopnea and PND. He does not appear volume overloaded but will see if small dose of lasix helps. Will start Lasix 20mg  daily.   Insomnia: will give him a short supply of ambien to see if this can help him sleep a little. If he requires something long term, he will need to follow up with PCP.  Medication Adjustments/Labs and Tests Ordered: Current medicines are reviewed at length with the patient today.  Concerns regarding medicines are outlined above.  Medication changes, Labs and Tests ordered today are listed in the Patient Instructions below. Patient Instructions  Medication Instructions:  Your physician has recommended you make the following change in your medication:  1.  START Colchicine 0.6 mg taking 1 tablet twice a day 2.  START Ambien 5 mg TAKE ONLY AS NEEDED FOR SLEEP 3.  START Lasix 20 mg taking 1 tablet daily   Labwork: None ordered  Testing/Procedures: None ordered  Follow-Up: Your physician recommends that you schedule a follow-up appointment in: 3 MONTHS WITH DR. Eden Emms   Any Other Special Instructions Will Be Listed Below (If Applicable).     If you need a refill on your cardiac medications before your next appointment, please call your pharmacy.      Signed, Cline Crock, PA-C  08/22/2015 3:30 PM    [facility] [address] [place], [place], [place]  [address] Phone: [phone]; Fax: [phone]

## 2015-08-22 ENCOUNTER — Encounter: Payer: Self-pay | Admitting: Physician Assistant

## 2015-08-22 ENCOUNTER — Ambulatory Visit (INDEPENDENT_AMBULATORY_CARE_PROVIDER_SITE_OTHER): Payer: Self-pay | Admitting: Physician Assistant

## 2015-08-22 ENCOUNTER — Encounter (INDEPENDENT_AMBULATORY_CARE_PROVIDER_SITE_OTHER): Payer: Self-pay

## 2015-08-22 ENCOUNTER — Other Ambulatory Visit: Payer: Self-pay | Admitting: *Deleted

## 2015-08-22 VITALS — BP 134/74 | HR 70 | Ht 71.0 in | Wt 159.0 lb

## 2015-08-22 DIAGNOSIS — F1910 Other psychoactive substance abuse, uncomplicated: Secondary | ICD-10-CM

## 2015-08-22 DIAGNOSIS — E118 Type 2 diabetes mellitus with unspecified complications: Secondary | ICD-10-CM

## 2015-08-22 DIAGNOSIS — I25118 Atherosclerotic heart disease of native coronary artery with other forms of angina pectoris: Secondary | ICD-10-CM

## 2015-08-22 DIAGNOSIS — I10 Essential (primary) hypertension: Secondary | ICD-10-CM

## 2015-08-22 DIAGNOSIS — I309 Acute pericarditis, unspecified: Secondary | ICD-10-CM

## 2015-08-22 DIAGNOSIS — Z951 Presence of aortocoronary bypass graft: Secondary | ICD-10-CM

## 2015-08-22 MED ORDER — COLCHICINE 0.6 MG PO TABS: 0.6000 mg | ORAL_TABLET | Freq: Two times a day (BID) | ORAL | 1 refills | Status: DC

## 2015-08-22 MED ORDER — ZOLPIDEM TARTRATE 5 MG PO TABS: 5.0000 mg | ORAL_TABLET | Freq: Every evening | ORAL | 0 refills | Status: DC | PRN

## 2015-08-22 MED ORDER — FUROSEMIDE 20 MG PO TABS: 20.0000 mg | ORAL_TABLET | Freq: Every day | ORAL | 3 refills | Status: DC

## 2015-08-22 MED FILL — ?FUROSEMIDE 20 MG TABLET: 20 | 30 days supply | Qty: 30 | Fill #0

## 2015-08-22 NOTE — Patient Instructions (Addendum)
Medication Instructions:  Your physician has recommended you make the following change in your medication:  1.  START Colchicine 0.6 mg taking 1 tablet twice a day 2.  START Ambien 5 mg TAKE ONLY AS NEEDED FOR SLEEP 3.  START Lasix 20 mg taking 1 tablet daily   Labwork: None ordered  Testing/Procedures: None ordered  Follow-Up: Your physician recommends that you schedule a follow-up appointment in: 3 MONTHS WITH DR. Eden Emms   Any Other Special Instructions Will Be Listed Below (If Applicable).     If you need a refill on your cardiac medications before your next appointment, please call your pharmacy.

## 2015-08-23 ENCOUNTER — Other Ambulatory Visit: Payer: Self-pay | Admitting: *Deleted

## 2015-08-23 DIAGNOSIS — G8918 Other acute postprocedural pain: Secondary | ICD-10-CM

## 2015-08-23 MED ORDER — OXYCODONE HCL 5 MG PO TABS: 5.0000 mg | ORAL_TABLET | ORAL | 0 refills | Status: DC | PRN

## 2015-08-23 NOTE — Telephone Encounter (Signed)
Willie Walker has called for a refill for his Oxycodone, his first request s/p CABG 08/05/15.  A new signed script was provided which he picked up personally.

## 2015-08-28 ENCOUNTER — Telehealth: Payer: Self-pay | Admitting: Cardiovascular Disease

## 2015-08-28 ENCOUNTER — Telehealth: Payer: Self-pay | Admitting: *Deleted

## 2015-08-28 ENCOUNTER — Other Ambulatory Visit: Payer: Self-pay | Admitting: Cardiovascular Disease

## 2015-08-28 NOTE — Telephone Encounter (Signed)
-----   Message from Quentin Angstlugbemiga E Jegede, MD sent at 08/21/2015  9:48 AM EDT ----- Please inform patient that the cysts in the kidneys are stable and likely benign, will follow up with another ultrasound in 6 months

## 2015-08-28 NOTE — Telephone Encounter (Signed)
New message      Pt c/o medication issue:  1. Name of Medication: colchicine 2. How are you currently taking this medication (dosage and times per day)? 0.6mg   3. Are you having a reaction (difficulty breathing--STAT)? no  4. What is your medication issue? Pharmacist said the directions is for pt to take medication for 3 days, however, the presc was written for 60 pills with 1 refill.  Please call

## 2015-08-28 NOTE — Telephone Encounter (Signed)
According to K. Thompson PA's note, order for colchicine was for two months only. Three day supply was from hospital order.   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 Continue colchicine (only discharged with 3 days so will give another 2 month supply). "  Called pharmacy to clarify order.

## 2015-08-28 NOTE — Telephone Encounter (Signed)
called to clarify that pt is to take colchine .6 mg for 3 days per instructions. Left detailed message on pharmacy VM.  colchicine 0.6 MG tablet 0.6 mg, 2 times daily 1 ordered         Summary: Take 1 tablet (0.6 mg total) by mouth 2 (two) times daily. For 3 days, Starting Thu 08/22/2015, Print  Dose, Route, Frequency: 0.6 mg, Oral, 2 times daily Start: 08/22/2015 Ord/Sold: 08/22/2015 (O) Report Taking:  Long-term:  Pharm[facility] [facility] [place] Med Dose History ChangeDiscontinue     Patient Sig: Take 1 tablet (0.6 mg total) by mouth 2 (two) times daily. For 3 days     Ordered on: 08/22/2015     Authorized by: Janetta HoraHOMPSON, KATHRYN R     Dispense: 60 tablet     Admin Instructions: For 3 days

## 2015-08-28 NOTE — Telephone Encounter (Signed)
Sejal is calling to get clarification on the medication Colchicine .  . Needing Clarification on the directions, is he supposed to be on it for 3 days or 3 months. Please call   Thanks

## 2015-08-28 NOTE — Telephone Encounter (Signed)
Patient verified DOB Patient is aware of cyst being stable and benign. Patient informed of a recheck being completed in 6 months., Patient expressed his understanding and had no further questions at this time.

## 2015-08-29 ENCOUNTER — Encounter: Payer: Self-pay | Admitting: *Deleted

## 2015-08-29 ENCOUNTER — Telehealth: Payer: Self-pay | Admitting: *Deleted

## 2015-08-29 ENCOUNTER — Emergency Department (HOSPITAL_COMMUNITY)
Admission: EM | Admit: 2015-08-29 | Discharge: 2015-08-29 | Disposition: A | Discharge status: Home / Self Care | Payer: Medicaid Other | Attending: Emergency Medicine | Admitting: Emergency Medicine

## 2015-08-29 ENCOUNTER — Telehealth: Payer: Self-pay | Admitting: Cardiovascular Disease

## 2015-08-29 ENCOUNTER — Encounter (HOSPITAL_COMMUNITY): Payer: Self-pay

## 2015-08-29 DIAGNOSIS — I951 Orthostatic hypotension: Secondary | ICD-10-CM

## 2015-08-29 DIAGNOSIS — I2510 Atherosclerotic heart disease of native coronary artery without angina pectoris: Secondary | ICD-10-CM | POA: Insufficient documentation

## 2015-08-29 DIAGNOSIS — Z955 Presence of coronary angioplasty implant and graft: Secondary | ICD-10-CM | POA: Insufficient documentation

## 2015-08-29 DIAGNOSIS — I252 Old myocardial infarction: Secondary | ICD-10-CM | POA: Insufficient documentation

## 2015-08-29 DIAGNOSIS — R55 Syncope and collapse: Secondary | ICD-10-CM | POA: Diagnosis present

## 2015-08-29 DIAGNOSIS — E1040 Type 1 diabetes mellitus with diabetic neuropathy, unspecified: Secondary | ICD-10-CM | POA: Diagnosis not present

## 2015-08-29 DIAGNOSIS — Z951 Presence of aortocoronary bypass graft: Secondary | ICD-10-CM | POA: Diagnosis not present

## 2015-08-29 DIAGNOSIS — Z7982 Long term (current) use of aspirin: Secondary | ICD-10-CM | POA: Insufficient documentation

## 2015-08-29 DIAGNOSIS — Z79899 Other long term (current) drug therapy: Secondary | ICD-10-CM | POA: Insufficient documentation

## 2015-08-29 DIAGNOSIS — I10 Essential (primary) hypertension: Secondary | ICD-10-CM | POA: Diagnosis not present

## 2015-08-29 DIAGNOSIS — Z87891 Personal history of nicotine dependence: Secondary | ICD-10-CM | POA: Diagnosis not present

## 2015-08-29 LAB — BASIC METABOLIC PANEL
ANION GAP: 8 (ref 5–15)
BUN: 28 mg/dL — AB (ref 6–20)
CHLORIDE: 101 mmol/L (ref 101–111)
CO2: 27 mmol/L (ref 22–32)
Calcium: 9.8 mg/dL (ref 8.9–10.3)
Creatinine, Ser: 1.67 mg/dL — ABNORMAL HIGH (ref 0.61–1.24)
GFR, EST AFRICAN AMERICAN: 53 mL/min — AB (ref >60)
GFR, EST NON AFRICAN AMERICAN: 46 mL/min — AB (ref >60)
Glucose, Bld: 106 mg/dL — ABNORMAL HIGH (ref 65–99)
POTASSIUM: 5.3 mmol/L — AB (ref 3.5–5.1)
SODIUM: 136 mmol/L (ref 135–145)

## 2015-08-29 LAB — URINALYSIS, ROUTINE W REFLEX MICROSCOPIC
Bilirubin Urine: NEGATIVE
Glucose, UA: NEGATIVE mg/dL
KETONES UR: NEGATIVE mg/dL
LEUKOCYTES UA: NEGATIVE
NITRITE: NEGATIVE
PH: 6 (ref 5.0–8.0)
PROTEIN: NEGATIVE mg/dL
Specific Gravity, Urine: 1.01 (ref 1.005–1.030)

## 2015-08-29 LAB — I-STAT TROPONIN, ED: TROPONIN I, POC: 0.01 ng/mL (ref 0.00–0.08)

## 2015-08-29 LAB — CBC
HEMATOCRIT: 32.7 % — AB (ref 39.0–52.0)
HEMOGLOBIN: 10.4 g/dL — AB (ref 13.0–17.0)
MCH: 27.7 pg (ref 26.0–34.0)
MCHC: 31.8 g/dL (ref 30.0–36.0)
MCV: 87.2 fL (ref 78.0–100.0)
Platelets: 477 10*3/uL — ABNORMAL HIGH (ref 150–400)
RBC: 3.75 MIL/uL — AB (ref 4.22–5.81)
RDW: 14.5 % (ref 11.5–15.5)
WBC: 6.9 10*3/uL (ref 4.0–10.5)

## 2015-08-29 LAB — RAPID URINE DRUG SCREEN, HOSP PERFORMED
AMPHETAMINES: NOT DETECTED
Barbiturates: NOT DETECTED
Benzodiazepines: NOT DETECTED
Cocaine: NOT DETECTED
OPIATES: NOT DETECTED
Tetrahydrocannabinol: POSITIVE — AB

## 2015-08-29 LAB — URINE MICROSCOPIC-ADD ON
Bacteria, UA: NONE SEEN
SQUAMOUS EPITHELIAL / LPF: NONE SEEN
WBC UA: NONE SEEN WBC/hpf (ref 0–5)

## 2015-08-29 LAB — CBG MONITORING, ED: Glucose-Capillary: 104 mg/dL — ABNORMAL HIGH (ref 65–99)

## 2015-08-29 MED ORDER — SODIUM CHLORIDE 0.9 % IV BOLUS (SEPSIS)
500.0000 mL | Freq: Once | INTRAVENOUS | Status: AC
  Administered 2015-08-29: 500 mL via INTRAVENOUS

## 2015-08-29 NOTE — Telephone Encounter (Signed)
New message    Pt c/o Syncope: STAT if syncope occurred within 30 minutes and pt complains of lightheadedness High Priority if episode of passing out, completely, today or in last 24 hours   1. Did you pass out today? 08/28/15  2. When is the last time you passed out? 08/28/15  3. Has this occurred multiple times? 08/27/15 pt passed out and 08/28/15 @ 11pm he passed out  Did you have any symptoms prior to passing out? no

## 2015-08-29 NOTE — Telephone Encounter (Signed)
Willie Walker has called to relate two episodes of dizziness and light headedness. One resulted in passing out while standing at the stove while he was alone.  No apparent injury.  The other time he was in a recliner. He says he feels fine now.  [facility]=120/67...BS=154 this morning.  I suggested he call cardiology since he has already been seen post op and he agreed.

## 2015-08-29 NOTE — ED Provider Notes (Signed)
[facility]-EMER[facility]ENCY DEPT Provider Note   CSN: [id number] Arrival date & time: 08/29/15  1337  First Provider Contact:  First MD Initiated Contact with Patient 08/29/15 1514        History   Chief Complaint Chief Complaint  Patient presents with  . Loss of Consciousness    HPI Willie Walker is a 50 y.o. male.  HPI Is status post 5 vessel coronary artery bypass graft surgery [phone]. Patient did well postoperatively. He reports he chronically has problems with chest pain but he notes no difference. He presents to the hospital today because he had a syncopal episode yesterday evening. He reports that he was watching television and got up quickly so he wouldn't miss anything went into the kitchen to fix some dinner. He reports he was just arranging things when he briefly felt dizzy and then passed out. No was home with him at that time. He reports he awakened but did not have residual symptoms. He does report periodically feeling somewhat lightheaded. He has not been experiencing chest pain or dyspnea. He does note significant increase in lightheadedness if he stands quickly. He has not had fever, chills, lower extremity swelling. He reports he has been compliant with his medications. He reports that due to his symptoms he did [facility] to check his blood pressure and it was 128/80. Past Medical History:  Diagnosis Date  . Anginal pain (HCC)   . Anxiety   . Childhood asthma   . Chronic lower back pain   . Coronary artery disease   . ELECTROCARDIOGRAM, ABNORMAL   . GERD (gastroesophageal reflux disease)   . Gout   . Headache(784.0) 09/18/2011   "maybe twice/wk; pressure on the brain"  . High cholesterol   . Hypertension   . Kidney stones   . Lower GI bleed 09/18/2011   "clots and everything; not lately"  . Myocardial infarction (HCC) 10/2005  . Peripheral vascular disease (HCC)    LLE  . Pneumonia 1990's  . Seizures (HCC) 09/18/2011   "blanks out on me"/wife's  report  . Shortness of breath 09/18/11   "@ rest; lying down; w/exertion"  . Type I diabetes mellitus [facility])     Patient Active Problem List   Diagnosis Date Noted  . 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  . Type 2 diabetes mellitus without complication, without long-term current use of insulin (HCC) 07/25/2015  . Renal cyst 07/25/2015  . Essential hypertension, benign 11/01/2014  . Primary gout 11/01/2014  . Polyneuropathy in diabetes(357.2) 07/28/2012  . Diabetic foot ulcer associated with type 2 diabetes mellitus (HCC) 07/28/2012  . Disc disease, degenerative, lumbar or lumbosacral 07/28/2012  . Heel ulcer, right 06/20/2012  . Type II or unspecified type diabetes mellitus without mention of complication, not stated as uncontrolled 06/20/2012  . Erectile dysfunction associated with type 2 diabetes mellitus (HCC) 03/10/2012  . CAD (coronary artery disease) 01/08/2012  . Pancreatitis 01/08/2012  . Unstable angina (HCC) 09/19/2011  . Type 2 diabetes mellitus (HCC) 09/09/2011  . Mixed hyperlipidemia 09/09/2011  . HTN (hypertension) 09/09/2011  . ELECTROCARDIOGRAM, ABNORMAL 12/12/2008    Past Surgical History:  Procedure Laterality Date  . BURR HOLE OF CRANIUM  1999   "mugged"  . CARDIAC CATHETERIZATION N/A 08/01/2015   Procedure: Left Heart Cath and Coronary Angiography;  Surgeon: Peter M Swaziland, MD;  Location: [facility] INVASIVE CV LAB;  Service: Cardiovascular;  Laterality: N/A;  . CORONARY ANGIOPLASTY  09/18/2011  . CORONARY ANGIOPLASTY WITH  STENT PLACEMENT  10/2005   "9"  . CORONARY ARTERY BYPASS GRAFT N/A 08/05/2015   Procedure: CORONARY ARTERY BYPASS GRAFTING (CABG), ON PUMP, TIMES FIVE, USING LEFT INTERNAL MAMMARY ARTERY AND LEFT RADIAL ARTERY, RIGHT GREATER SAPHENOUS VEIN HARVESTED ENDOSCOPICALLY;  Surgeon: Loreli Slot, MD;  Location: [facility] OR;  Service: Open Heart Surgery;  Laterality: N/A;  . LACERATION REPAIR  1999   BLE "mugging"  . PERCUTANEOUS CORONARY  STENT INTERVENTION (PCI-S) N/A 09/18/2011   Procedure: PERCUTANEOUS CORONARY STENT INTERVENTION (PCI-S);  Surgeon: Peter M Swaziland, MD;  Location: [facility] CATH LAB;  Service: Cardiovascular;  Laterality: N/A;  . RADIAL ARTERY HARVEST Left 08/05/2015   Procedure: RADIAL ARTERY HARVEST;  Surgeon: Loreli Slot, MD;  Location: [facility] OR;  Service: Open Heart Surgery;  Laterality: Left;  . TEE WITHOUT CARDIOVERSION N/A 08/05/2015   Procedure: TRANSESOPHAGEAL ECHOCARDIOGRAM (TEE);  Surgeon: Loreli Slot, MD;  Location: [facility] OR;  Service: Open Heart Surgery;  Laterality: N/A;       Home Medications    Prior to Admission medications   Medication Sig Start Date End Date Taking? Authorizing Provider  acetaminophen (TYLENOL) 500 MG tablet Take 2 tablets (1,000 mg total) by mouth every 6 (six) hours as needed for mild pain or fever. 08/10/15   Erin R Barrett, PA-C  allopurinol (ZYLOPRIM) 100 MG tablet Take 1 tablet (100 mg total) by mouth daily. 11/01/14   Quentin Angst, MD  aspirin EC 325 MG EC tablet Take 1 tablet (325 mg total) by mouth daily. 08/10/15   Erin R Barrett, PA-C  atorvastatin (LIPITOR) 80 MG tablet Take 1 tablet (80 mg total) by mouth daily at 6 PM. 08/10/15   Erin R Barrett, PA-C  colchicine 0.6 MG tablet Take 1 tablet (0.6 mg total) by mouth 2 (two) times daily. For 3 days 08/22/15   Janetta Hora, PA-C  fish oil-omega-3 fatty acids 1000 MG capsule Take 1 g by mouth 2 (two) times daily.    Historical Provider, MD  furosemide (LASIX) 20 MG tablet Take 1 tablet (20 mg total) by mouth daily. 08/22/15 11/20/15  Janetta Hora, PA-C  metFORMIN (GLUCOPHAGE) 500 MG tablet Take 1 tablet (500 mg total) by mouth 2 (two) times daily with a meal. Patient taking differently: Take 500 mg by mouth daily with breakfast.  11/01/14   Quentin Angst, MD  metoprolol tartrate (LOPRESSOR) 25 MG tablet Take 1 tablet (25 mg total) by mouth 2 (two) times daily. 08/10/15   Erin R Barrett, PA-C    Multiple Vitamins-Minerals (MULTIVITAMIN WITH MINERALS) tablet Take 1 tablet by mouth daily.    Historical Provider, MD  nitroGLYCERIN (NITROSTAT) 0.4 MG SL tablet Place 1 tablet (0.4 mg total) under the tongue every 5 (five) minutes x 3 doses as needed for chest pain. 07/31/15 05/28/17  Bhavinkumar Bhagat, PA  oxyCODONE (OXY IR/ROXICODONE) 5 MG immediate release tablet Take 1-2 tablets (5-10 mg total) by mouth every 4 (four) hours as needed for severe pain. 08/23/15   Donielle Margaretann Loveless, PA-C  zolpidem (AMBIEN) 5 MG tablet Take 1 tablet (5 mg total) by mouth at bedtime as needed for sleep. 08/22/15   Janetta Hora, PA-C    Family History Family History  Problem Relation Age of Onset  . Hypertension Mother   . Dementia Mother   . CVA Father   . Heart attack Maternal Grandmother     Social History Social History  Substance Use Topics  . Smoking status: Former Smoker  Packs/day: 0.50    Years: 20.00    Types: Cigarettes    Quit date: 11/02/2005  . Smokeless tobacco: Never Used  . Alcohol use 2.4 oz/week    4 Shots of liquor per week     Comment: 08/01/2015  "coulple shots a couple times/week "     Allergies   Crestor [rosuvastatin]   Review of Systems Review of Systems 10 Systems reviewed and are negative for acute change except as noted in the HPI.   Physical Exam Updated Vital Signs BP 111/75   Pulse (!) 57   Temp 98.1 F (36.7 C) (Oral)   Resp 18   Ht 5\' 11"  (1.803 m)   Wt 156 lb (70.8 kg)   SpO2 100%   BMI 21.76 kg/m   Physical Exam  Constitutional: He appears well-developed and well-nourished.  HENT:  Head: Normocephalic and atraumatic.  Mouth/Throat: Oropharynx is clear and moist.  Eyes: Conjunctivae and EOM are normal. Pupils are equal, round, and reactive to light.  Neck: Neck supple.  Cardiovascular: Normal rate, regular rhythm, normal heart sounds and intact distal pulses.   No murmur heard. Pulmonary/Chest: Effort normal and breath sounds  normal. No respiratory distress.  Patient has anterior chest incision that is healing well. No erythema. Nearly healed.  Abdominal: Soft. He exhibits no distension. There is no tenderness.  Abdomen soft nontender. Associated surgical incisions are healing very well.  Musculoskeletal: Normal range of motion. He exhibits no edema, tenderness or deformity.  Patient has one incision on the medial leg superior to the knee on the right. Again healing very well. No surrounding erythema or edema. Lower extremities are normal without popliteal fossa tenderness or calf tenderness. No peripheral edema.  Neurological: He is alert.  Skin: Skin is warm and dry.  Psychiatric: He has a normal mood and affect.  Nursing note and vitals reviewed.    ED Treatments / Results  Labs (all labs ordered are listed, but only abnormal results are displayed) Labs Reviewed  BASIC METABOLIC PANEL - Abnormal; Notable for the following:       Result Value   Potassium 5.3 (*)    Glucose, Bld 106 (*)    BUN 28 (*)    Creatinine, Ser 1.67 (*)    GFR calc non Af Amer 46 (*)    GFR calc Af Amer 53 (*)    All other components within normal limits  CBC - Abnormal; Notable for the following:    RBC 3.75 (*)    Hemoglobin 10.4 (*)    HCT 32.7 (*)    Platelets 477 (*)    All other components within normal limits  URINALYSIS, ROUTINE W REFLEX MICROSCOPIC (NOT AT [facility]) - Abnormal; Notable for the following:    Hgb urine dipstick MODERATE (*)    All other components within normal limits  URINE RAPID DRUG SCREEN, HOSP PERFORMED - Abnormal; Notable for the following:    Tetrahydrocannabinol POSITIVE (*)    All other components within normal limits  CBG MONITORING, ED - Abnormal; Notable for the following:    Glucose-Capillary 104 (*)    All other components within normal limits  URINE MICROSCOPIC-ADD ON  Rosezena SensorI-STAT TROPOININ, ED    EKG  EKG Interpretation  Date/Time:  Thursday August 29 2015 13:45:09 EDT Ventricular  Rate:  57 PR Interval:  154 QRS Duration: 88 QT Interval:  396 QTC Calculation: 385 R Axis:   70 Text Interpretation:  Sinus bradycardia Nonspecific T wave abnormality Abnormal ECG No  significant change since last tracing Confirmed by Kandis Mannan ([id number]) on 08/29/2015 3:05:02 PM       Radiology No results found.  Procedures Procedures (including critical care time)  Medications Ordered in ED Medications  sodium chloride 0.9 % bolus 500 mL (0 mLs Intravenous Stopped 08/29/15 1650)     Initial Impression / Assessment and Plan / ED Course  I have reviewed the triage vital signs and the nursing notes.  Pertinent labs & imaging results that were available during my care of the patient were reviewed by me and considered in my medical decision making (see chart for details).  Clinical Course     Final Clinical Impressions(s) / ED Diagnoses   Final diagnoses:  Orthostatic hypotension  Syncope and collapse   He had an episode yesterday where upon he started very quickly and rushed to the kitchen. He very shortly thereafter had a syncope episode. He does note lightheadedness with standing. Orthostatic blood pressures do show drop in blood pressure and with metoprolol, patient's heart rate is consistently in the 50s. He has no chest pain or shortness of breath. He is well in appearance. At this time I recommend he decrease his metoprolol dose by half in the morning and continue his normal evening dose. He is counseled on close follow-up with his cardiologist to monitor his blood pressures and heart rate. New Prescriptions New Prescriptions   No medications on file     Arby Barrette, MD 08/29/15 1702

## 2015-08-29 NOTE — ED Triage Notes (Signed)
Patient here after syncopal episode last pm while cooking dinner. Patient complains of right elbow pain from same. Had CABG 2 weeks ago. On assessment alert and oriented.

## 2015-08-29 NOTE — Telephone Encounter (Signed)
SPOKE  WITH  PT .PER PT   HAD  COUPLE OF  EPISODES OF  PASSING OUT   AND   1  EPISODE  OF LIGHTHEADEDNESS  PT  NOT  SURE   OF  B/P  OR  HEART  AT TIME OF EPISODES  TODAY'S B/P  WAS 120/67 AND  HEART  RATE  OF 93 ALSO  NOT  SURE  WHAT SUGAR LEVELS  ARE   DISCUSSED  WITH   DAYNA DUNN , PT NEEDS  TO  GO TO ER  FOR EVAL AND  TX .PT  AWARE OF RECOMMENDATIONS  WILL HAVE  [facility]STER  PICK UP  AND  TRANSPORT TO  ER  NO S/S  AT THIS TIME .Zack Seal/CY

## 2015-08-29 NOTE — Discharge Instructions (Signed)
Take half of your evening metoprolol dose (12.5mg ), then start taking a half a tablet of your metoprolol (12.5mg ) in the mornings and a whole tablet (25 mg) in the evening. Call Dr.Nishan's office in the morning to arrange close monitoring of your blood pressure and heart rate.

## 2015-09-06 ENCOUNTER — Other Ambulatory Visit: Payer: Self-pay | Admitting: Thoracic Surgery (Cardiothoracic Vascular Surgery)

## 2015-09-06 ENCOUNTER — Other Ambulatory Visit: Payer: Self-pay

## 2015-09-06 DIAGNOSIS — G8918 Other acute postprocedural pain: Secondary | ICD-10-CM

## 2015-09-06 DIAGNOSIS — Z951 Presence of aortocoronary bypass graft: Secondary | ICD-10-CM

## 2015-09-06 MED ORDER — OXYCODONE HCL 5 MG PO TABS: 5.0000 mg | ORAL_TABLET | Freq: Four times a day (QID) | ORAL | 0 refills | Status: DC | PRN

## 2015-09-06 MED ORDER — OXYCODONE HCL 5 MG PO TABS: 5.0000 mg | ORAL_TABLET | ORAL | 0 refills | Status: DC | PRN

## 2015-09-06 NOTE — Telephone Encounter (Signed)
RX for pain med given

## 2015-09-09 ENCOUNTER — Ambulatory Visit
Admission: RE | Admit: 2015-09-09 | Discharge: 2015-09-09 | Disposition: A | Discharge status: Home / Self Care | Payer: Self-pay | Source: Ambulatory Visit | Attending: Thoracic Surgery (Cardiothoracic Vascular Surgery) | Admitting: Thoracic Surgery (Cardiothoracic Vascular Surgery)

## 2015-09-09 ENCOUNTER — Ambulatory Visit (INDEPENDENT_AMBULATORY_CARE_PROVIDER_SITE_OTHER): Payer: Self-pay | Admitting: Physician Assistant

## 2015-09-09 VITALS — BP 86/55 | HR 60 | Resp 20 | Ht 66.0 in | Wt 170.0 lb

## 2015-09-09 DIAGNOSIS — Z951 Presence of aortocoronary bypass graft: Secondary | ICD-10-CM

## 2015-09-09 DIAGNOSIS — I2510 Atherosclerotic heart disease of native coronary artery without angina pectoris: Secondary | ICD-10-CM

## 2015-09-09 MED ORDER — METOPROLOL TARTRATE 25 MG PO TABS: 12.5000 mg | ORAL_TABLET | Freq: Two times a day (BID) | ORAL | 3 refills | Status: DC

## 2015-09-09 NOTE — Progress Notes (Signed)
Willie Walker is a 50 y.o. male patient.  1. CAD, multiple vessel   2. S/P CABG x 5    Past Medical History:  Diagnosis Date  . Anginal pain (HCC)   . Anxiety   . Childhood asthma   . Chronic lower back pain   . Coronary artery disease   . ELECTROCARDIOGRAM, ABNORMAL   . GERD (gastroesophageal reflux disease)   . Gout   . Headache(784.0) 09/18/2011   "maybe twice/wk; pressure on the brain"  . High cholesterol   . Hypertension   . Kidney stones   . Lower GI bleed 09/18/2011   "clots and everything; not lately"  . Myocardial infarction (HCC) 10/2005  . Peripheral vascular disease (HCC)    LLE  . Pneumonia 1990's  . Seizures (HCC) 09/18/2011   "blanks out on me"/wife's report  . Shortness of breath 09/18/11   "@ rest; lying down; w/exertion"  . Type I diabetes mellitus (HCC)    No past surgical history pertinent negatives on file. Scheduled Meds: Continuous Infusions: PRN Meds:  Allergies  Allergen Reactions  . Crestor [Rosuvastatin] Other (See Comments)    "Makes my legs hurt" - tolerates simvastatin    Active Problems:   * No active hospital problems. *  Blood pressure (!) 86/55, pulse 60, resp. rate 20, height 5\' 6"  (1.676 m), weight 170 lb (77.1 kg), SpO2 96 %.  Subjective:  Diet: Adequate intake.   Activity level: Returning to normal.   Pain control: Well controlled.   Wound: Itching.    Objective: Vital signs (most recent): Blood pressure (!) 86/55, pulse 60, resp. rate 20, height 5\' 6"  (1.676 m), weight 170 lb (77.1 kg), SpO2 96 %. General appearance: Comfortable.   Lungs:  Normal effort.  Breath sounds normal.   Heart: Bradycardia.  S1 normal and S2 normal.   Chest: Symmetric chest wall expansion.   Abdomen: Abdomen is soft.   Bowel sounds:  Bowel sounds are normal.   Tenderness: There is no abdominal tenderness tenderness.   Wound:  Clean.  There is no drainage.   Extremities: There is normal range of motion.   Neurological: The patient is alert and  oriented to person, place and time.    Plan: Encourage ambulation.  Continue wound care as written.  Regular diet.    Willie Walker is status post coronary artery bypass grafting 5 by Dr. Dorris FetchHendrickson on 08/06/2015. Today he is doing overall very well. His only complaint is lack of sleep due to positioning. We reviewed some tips on positioning it would be safe for his sternal incision and also help him get a good night's rest. He is slowly weaning himself off his pain medication and has had little chest discomfort. He does have some itching around the stitch that was protruding mid incision which was removed today. His blood pressure was hypotensive today, therefore we reduced his metoprolol to 12.5 mg twice a day and stop his Lasix. His right EVH site is clean dry and intact without any drainage or erythema. His chest incision is also clear dry and intact without any drainage as well as his left radial artery harvest site. We discussed cardiac rehabilitation in 2 weeks to again increase strength and mobility. He is encouraged to purchase a blood pressure cuff and take his blood pressure a few times a day. He did have some dizziness the other morning before arising. Otherwise, he had no other complaints at this time. He knows to follow up with his  primary care provider and he saw his cardiologist last week. He is encouraged to call our office with any questions or concerns regarding his incision or postop care. He is to follow up with us on a when necessary basis.  Sharlene Doryessa N Shanaiya Bene 09/09/2015

## 2015-09-09 NOTE — Patient Instructions (Signed)
Discussed routine postop follow-up with Willie Walker.

## 2015-09-10 ENCOUNTER — Ambulatory Visit: Payer: Self-pay | Admitting: Thoracic Surgery (Cardiothoracic Vascular Surgery)

## 2015-09-24 ENCOUNTER — Other Ambulatory Visit: Payer: Self-pay | Admitting: *Deleted

## 2015-09-24 DIAGNOSIS — G8918 Other acute postprocedural pain: Secondary | ICD-10-CM

## 2015-09-24 MED ORDER — OXYCODONE HCL 5 MG PO TABS: 5.0000 mg | ORAL_TABLET | Freq: Four times a day (QID) | ORAL | 0 refills | Status: DC | PRN

## 2015-09-24 NOTE — Telephone Encounter (Signed)
Willie Walker has called for a refill for Oxycodone s/p CABG 08/05/15, last refilled 08/30/15. I informed him that a new script would be available at the front desk today and he voiced understanding.

## 2015-09-27 MED FILL — ?METFORMIN HCL 500MG TABLET: 500 | 30 days supply | Qty: 60 | Fill #6

## 2015-10-01 MED FILL — LISINOPRIL 10 MG TABLET: 10 | 30 days supply | Qty: 30 | Fill #6

## 2015-10-02 ENCOUNTER — Other Ambulatory Visit: Payer: Self-pay | Admitting: *Deleted

## 2015-10-02 MED ORDER — METOPROLOL TARTRATE 25 MG PO TABS
12.5000 mg | ORAL_TABLET | Freq: Two times a day (BID) | ORAL | 10 refills | Status: DC
Start: 2015-10-02 — End: 2015-12-05

## 2015-10-02 MED FILL — ?METOPROLOL 25 MG TABLET: 25 | 30 days supply | Qty: 30 | Fill #0

## 2015-10-02 NOTE — Telephone Encounter (Signed)
Dose was reduced to 12.5 mg bid by the surgeon Dr Dorris FetchHendrickson on 09/09/15 per patient. Patient requested that Dr Eden EmmsNishan refill. Ok to refill? Please advise. Thanks, MI

## 2015-10-14 ENCOUNTER — Other Ambulatory Visit: Payer: Self-pay | Admitting: *Deleted

## 2015-10-14 DIAGNOSIS — G8918 Other acute postprocedural pain: Secondary | ICD-10-CM

## 2015-10-14 MED ORDER — TRAMADOL HCL 50 MG PO TABS: 50.0000 mg | ORAL_TABLET | Freq: Four times a day (QID) | ORAL | 0 refills | Status: DC | PRN

## 2015-10-14 NOTE — Telephone Encounter (Signed)
Willie Walker is s/p CABG 08/05/15 and has called for a refill for Oxycodone, last filled on  09/24/15. He is continuing to have pain in his right shoulder, collar bone into his neck. He said he has told this to the providers at his follow up visits, but it has not been documented. I said that he is now 2 months post op and Tramadol would be prescribed and faxed to his pharmacy. If his shoulder discomfort, etc. continues and does not improve in the next few weeks, he is to call for an appointment with Dr. Dorris FetchHendrickson. He understands and agrees.

## 2015-11-13 ENCOUNTER — Other Ambulatory Visit: Payer: Self-pay | Admitting: Internal Medicine

## 2015-11-13 DIAGNOSIS — E119 Type 2 diabetes mellitus without complications: Secondary | ICD-10-CM

## 2015-11-13 DIAGNOSIS — I10 Essential (primary) hypertension: Secondary | ICD-10-CM

## 2015-11-13 MED FILL — ?METOPROLOL 25 MG TABLET: 25 | 30 days supply | Qty: 30 | Fill #1

## 2015-11-13 MED FILL — LISINOPRIL 10 MG TABLET: 10 | 30 days supply | Qty: 30 | Fill #0

## 2015-11-13 MED FILL — metFORMIN HCL 500 MG TABS: 500 | 30 days supply | Qty: 60 | Fill #0

## 2015-11-27 NOTE — Progress Notes (Signed)
Cardiology Office Note    Date:  11/29/2015   ID:  TOLBERT MATHESON, DOB [birth year], MRN [id number]  PCP:  Jeanann Lewandowsky, MD  Cardiologist: Dr. Eden Emms  CC: post hosp f/u - CABG  History of Present Illness:  Willie Walker is a 50 y.o. male with a history of CAD s/p multiple PCIs and CABG x5V (07/2015), HTN, HLD, PVD and ongoing polysubstance abuse who presents to clinic for post hospital follow up  History of CAD and multiple PCI procedures in the past. He had several stents placed in [place] with full metal jacket in the RCA and stent to the intermediate. LHC (8/292013) for chest pain showed:  dLM 30%, mLAD 60%, dLAD 40%, IM 30% ISR, D1 40-50%, OM/AV groove branch 95% ISR, pRCA 90% SIR, dRCA 80% ISR, EF 60%. Subsequently underwent cutting Balloon angioplasty to the mid RCA; Cutting Balloon angioplasty to the mid circumflex. Last seen by Dr. Eden Emms 12/2013. Per note patient patient was enrolled in STATUSFIRST trial 08/29/14.   He saw Vin Bhagat PA-C in the office on 07/31/15 for worsening chest pain. He continued to smoke marijuana and occasionally crack cocaine. He was set up for cath the following day with Dr. Swaziland. This revealed critical 3 vessel obstructive CAD and he was referred for CABG.  He was admitted from 7/13-7/22/17. He underwent CABG x5V utilizing LIMA to LAD, SVG to Diagonal, Sequential SVG to Ramus Intermediate and OM, and Radial artery to the PDA on 08/05/15 with Dr. Dorris Fetch. He also underwent open harvest of his left radial artery and endoscopic harvest of greater saphenous vein from right thigh. His hospital course was complicated by pericarditis and he was started on Colchicine as well as significant volume overload requiring diuresis. His creat did become elevated and Lisinopril was discontinued. Creat 1.45 at discharge.  Seen in ER for pre syncope 8/10 ? Postural and lopresser am dose decreased Some issues with insomnia  Ambien not helping Smoking mariajuana to  sleep   Past Medical History:  Diagnosis Date  . Anginal pain (HCC)   . Anxiety   . Childhood asthma   . Chronic lower back pain   . Coronary artery disease   . ELECTROCARDIOGRAM, ABNORMAL   . GERD (gastroesophageal reflux disease)   . Gout   . Headache(784.0) 09/18/2011   "maybe twice/wk; pressure on the brain"  . High cholesterol   . Hypertension   . Kidney stones   . Lower GI bleed 09/18/2011   "clots and everything; not lately"  . Myocardial infarction 10/2005  . Peripheral vascular disease (HCC)    LLE  . Pneumonia 1990's  . Seizures (HCC) 09/18/2011   "blanks out on me"/wife's report  . Shortness of breath 09/18/11   "@ rest; lying down; w/exertion"  . Type I diabetes mellitus (HCC)     Past Surgical History:  Procedure Laterality Date  . BURR HOLE OF CRANIUM  1999   "mugged"  . CARDIAC CATHETERIZATION N/A 08/01/2015   Procedure: Left Heart Cath and Coronary Angiography;  Surgeon: Boyd Litaker M Swaziland, MD;  Location: [facility] INVASIVE CV LAB;  Service: Cardiovascular;  Laterality: N/A;  . CORONARY ANGIOPLASTY  09/18/2011  . CORONARY ANGIOPLASTY WITH STENT PLACEMENT  10/2005   "9"  . CORONARY ARTERY BYPASS GRAFT N/A 08/05/2015   Procedure: CORONARY ARTERY BYPASS GRAFTING (CABG), ON PUMP, TIMES FIVE, USING LEFT INTERNAL MAMMARY ARTERY AND LEFT RADIAL ARTERY, RIGHT GREATER SAPHENOUS VEIN HARVESTED ENDOSCOPICALLY;  Surgeon: Loreli Slot, MD;  Location: [facility] OR;  Service: Open Heart Surgery;  Laterality: N/A;  . LACERATION REPAIR  1999   BLE "mugging"  . PERCUTANEOUS CORONARY STENT INTERVENTION (PCI-S) N/A 09/18/2011   Procedure: PERCUTANEOUS CORONARY STENT INTERVENTION (PCI-S);  Surgeon: Dessire Grimes M SwazilandJordan, MD;  [facility];  Service: Cardiovascular;  Laterality: N/A;  . RADIAL ARTERY HARVEST Left 08/05/2015   Procedure: RADIAL ARTERY HARVEST;  Surgeon: Loreli SlotSteven C Hendrickson, MD;  [facility];  Service: Open Heart Surgery;  Laterality: Left;  . TEE WITHOUT CARDIOVERSION  N/A 08/05/2015   Procedure: TRANSESOPHAGEAL ECHOCARDIOGRAM (TEE);  Surgeon: Loreli SlotSteven C Hendrickson, MD;  [facility];  Service: Open Heart Surgery;  Laterality: N/A;    Current Medications: Outpatient Medications Prior to Visit  Medication Sig Dispense Refill  . acetaminophen (TYLENOL) 500 MG tablet Take 2 tablets (1,000 mg total) by mouth every 6 (six) hours as needed for mild pain or fever. 30 tablet 0  . allopurinol (ZYLOPRIM) 100 MG tablet Take 1 tablet (100 mg total) by mouth daily. 30 tablet 6  . ALPRAZolam (XANAX) 0.25 MG tablet Take 0.25 mg by mouth 2 (two) times daily as needed for anxiety.    Marland Kitchen. aspirin EC 325 MG EC tablet Take 1 tablet (325 mg total) by mouth daily. 30 tablet 0  . colchicine 0.6 MG tablet Take 1 tablet (0.6 mg total) by mouth 2 (two) times daily. For 3 days 60 tablet 1  . fish oil-omega-3 fatty acids 1000 MG capsule Take 1 g by mouth 2 (two) times daily.    Marland Kitchen. levothyroxine (SYNTHROID, LEVOTHROID) 50 MCG tablet Take 50 mcg by mouth daily before breakfast. X 3 WKS    . lisinopril (PRINIVIL,ZESTRIL) 10 MG tablet TAKE 1 TABLET BY MOUTH DAILY 30 tablet 0  . metFORMIN (GLUCOPHAGE) 500 MG tablet TAKE 1 TABLET BY MOUTH TWICE DAILY WITH A MEAL 60 tablet 0  . metoprolol tartrate (LOPRESSOR) 25 MG tablet Take 0.5 tablets (12.5 mg total) by mouth 2 (two) times daily. 30 tablet 10  . Multiple Vitamins-Minerals (MULTIVITAMIN WITH MINERALS) tablet Take 1 tablet by mouth daily.    . ondansetron (ZOFRAN) 4 MG tablet Take 4 mg by mouth every 4 (four) hours as needed for nausea or vomiting.    Marland Kitchen. oxyCODONE (OXY IR/ROXICODONE) 5 MG immediate release tablet Take 1-2 tablets (5-10 mg total) by mouth every 6 (six) hours as needed for severe pain. 30 tablet 0  . sucralfate (CARAFATE) 1 g tablet Take 1 g by mouth 4 (four) times daily -  with meals and at bedtime.    . traMADol (ULTRAM) 50 MG tablet Take 1 tablet (50 mg total) by mouth every 6 (six) hours as needed. 40 tablet 0  . zolpidem  (AMBIEN) 5 MG tablet Take 1 tablet (5 mg total) by mouth at bedtime as needed for sleep. 14 tablet 0  . carvedilol (COREG) 3.125 MG tablet Take 3.125 mg by mouth 2 (two) times daily with a meal.     No facility-administered medications prior to visit.      Allergies:   Crestor [rosuvastatin]   Social History   Social History  . Marital status: Divorced    Spouse name: N/A  . Number of children: N/A  . Years of education: N/A   Social History Main Topics  . Smoking status: Former Smoker    Packs/day: 0.50    Years: 20.00    Types: Cigarettes    Quit date: 11/02/2005  . Smokeless tobacco: Never Used  . Alcohol use  2.4 oz/week    4 Shots of liquor per week     Comment: 08/01/2015  "coulple shots a couple times/week "  . Drug use:     Types: Cocaine, Marijuana, "Crack" cocaine     Comment: 08/01/2015 "did some coke 07/23/2015; nothing since 2010 before that"  . Sexual activity: Not Currently   Other Topics Concern  . None   Social History Narrative  . None     Family History:  The patient's family history includes CVA in his father; Dementia in his mother; Heart attack in his maternal grandmother; Hypertension in his mother.     ROS:   Please see the history of present illness.    ROS All other systems reviewed and are negative.   PHYSICAL EXAM:   VS:  BP 100/60   Pulse 63   Ht 5\' 11"  (1.803 m)   Wt 75.7 kg (166 lb 12.8 oz)   SpO2 98%   BMI 23.26 kg/m    GEN: Well nourished, well developed, in no acute distress  HEENT: normal  Neck: no JVD, carotid bruits, or masses Cardiac: RRR; no murmurs, rubs, or gallops,no edema  Respiratory:  clear to auscultation bilaterally, normal work of breathing GI: soft, nontender, nondistended, + BS MS: no deformity or atrophy  Skin: warm and dry, no rash Neuro:  Alert and Oriented x 3, Strength and sensation are intact Psych: euthymic mood, full affect  Wt Readings from Last 3 Encounters:  11/29/15 75.7 kg (166 lb 12.8 oz)    09/09/15 77.1 kg (170 lb)  08/29/15 70.8 kg (156 lb)      Studies/Labs Reviewed:   EKG:  08/30/15  NSR HR 70 with non specific ST/TW abnormalities.   Recent Labs: 07/31/2015: ALT 15 08/06/2015: Magnesium 2.4 08/29/2015: BUN 28; Creatinine, Ser 1.67; Hemoglobin 10.4; Platelets 477; Potassium 5.3; Sodium 136   Lipid Panel    Component Value Date/Time   CHOL 232 (H) 07/31/2015 1051   TRIG 101 07/31/2015 1051   HDL 53 07/31/2015 1051   CHOLHDL 4.4 07/31/2015 1051   VLDL 20 07/31/2015 1051   LDLCALC 159 (H) 07/31/2015 1051    Additional studies/ records that were reviewed today include:  LHC 08/01/15 Conclusion   Ost RCA to Mid RCA lesion, 40% stenosed. The lesion was previously treated with a drug-eluting stent greater than two years ago.  Mid RCA to Dist RCA lesion, 100% stenosed. The lesion was previously treated with a drug-eluting stent greater than two years ago.  Acute Mrg lesion, 90% stenosed.  Ramus lesion, 40% stenosed. The lesion was previously treated with a drug-eluting stent greater than two years ago.  Prox Cx to Mid Cx lesion, 100% stenosed. The lesion was previously treated with a drug-eluting stent greater than two years ago.  Ost 2nd Diag to 2nd Diag lesion, 70% stenosed.  Mid LAD to Dist LAD lesion, 50% stenosed.  Prox LAD to Mid LAD lesion, 95% stenosed.  The left ventricular systolic function is normal.   1. Critical 3 vessel obstructive CAD 2. Normal LV function  Plan: Admit telemetry. Start IV heparin and Ntg. CT surgery consult for CABG.    2D ECHO: 08/01/2015 LV EF: 45% -   50% Study Conclusions - Left ventricle: The cavity size was normal. Wall thickness was   normal. Systolic function was mildly reduced. The estimated   ejection fraction was in the range of 45% to 50%. Diffuse   hypokinesis. Left ventricular diastolic function parameters were   normal. -  Aorta: Aortic root dimension: 38 mm (ED). - Ascending aorta: The ascending aorta  was mildly dilated.    ASSESSMENT & PLAN:   CAD: s/p multiple PCI procedures in the past and now CAGB x5V. Continue ASA 325mg  daily, statin and BB.   Polysubstance abuse: long discussion about this. He has totally sworn off cocaine but using mariajuana to sleep  HLD: LDL 154 on most recent lipid panel. Goal <70. Recently started on atorvastatin 80mg  daily. Will recheck lipid panel at next appointment to see if closer to goal.   DMT2: HgA1c 5.9 on 08/04/15.  Mild LV dysfunction: EF 45-50%.   Insomnia: Ancil Linseyambian   Havah Ammon

## 2015-11-29 ENCOUNTER — Encounter: Payer: Self-pay | Admitting: Cardiovascular Disease

## 2015-11-29 ENCOUNTER — Ambulatory Visit (INDEPENDENT_AMBULATORY_CARE_PROVIDER_SITE_OTHER): Payer: Medicaid Other | Admitting: Cardiovascular Disease

## 2015-11-29 ENCOUNTER — Encounter (INDEPENDENT_AMBULATORY_CARE_PROVIDER_SITE_OTHER): Payer: Self-pay

## 2015-11-29 VITALS — BP 100/60 | HR 63 | Ht 71.0 in | Wt 166.8 lb

## 2015-11-29 DIAGNOSIS — I25118 Atherosclerotic heart disease of native coronary artery with other forms of angina pectoris: Secondary | ICD-10-CM | POA: Diagnosis not present

## 2015-11-29 NOTE — Patient Instructions (Signed)

## 2015-12-05 ENCOUNTER — Encounter: Payer: Self-pay | Admitting: Internal Medicine

## 2015-12-05 ENCOUNTER — Ambulatory Visit: Payer: Medicaid Other | Attending: Internal Medicine | Admitting: Internal Medicine

## 2015-12-05 VITALS — BP 122/76 | HR 55 | Temp 98.4°F | Resp 18 | Ht 71.0 in | Wt 164.8 lb

## 2015-12-05 DIAGNOSIS — Z794 Long term (current) use of insulin: Secondary | ICD-10-CM | POA: Insufficient documentation

## 2015-12-05 DIAGNOSIS — Z7982 Long term (current) use of aspirin: Secondary | ICD-10-CM | POA: Diagnosis not present

## 2015-12-05 DIAGNOSIS — I10 Essential (primary) hypertension: Secondary | ICD-10-CM | POA: Diagnosis not present

## 2015-12-05 DIAGNOSIS — E785 Hyperlipidemia, unspecified: Secondary | ICD-10-CM | POA: Diagnosis not present

## 2015-12-05 DIAGNOSIS — F1910 Other psychoactive substance abuse, uncomplicated: Secondary | ICD-10-CM | POA: Diagnosis not present

## 2015-12-05 DIAGNOSIS — G8918 Other acute postprocedural pain: Secondary | ICD-10-CM | POA: Insufficient documentation

## 2015-12-05 DIAGNOSIS — I2510 Atherosclerotic heart disease of native coronary artery without angina pectoris: Secondary | ICD-10-CM | POA: Insufficient documentation

## 2015-12-05 DIAGNOSIS — I300 Acute nonspecific idiopathic pericarditis: Secondary | ICD-10-CM | POA: Insufficient documentation

## 2015-12-05 DIAGNOSIS — E1151 Type 2 diabetes mellitus with diabetic peripheral angiopathy without gangrene: Secondary | ICD-10-CM | POA: Insufficient documentation

## 2015-12-05 DIAGNOSIS — Z9889 Other specified postprocedural states: Secondary | ICD-10-CM | POA: Diagnosis present

## 2015-12-05 DIAGNOSIS — Z951 Presence of aortocoronary bypass graft: Secondary | ICD-10-CM | POA: Diagnosis not present

## 2015-12-05 DIAGNOSIS — E119 Type 2 diabetes mellitus without complications: Secondary | ICD-10-CM

## 2015-12-05 DIAGNOSIS — F5102 Adjustment insomnia: Secondary | ICD-10-CM | POA: Insufficient documentation

## 2015-12-05 LAB — GLUCOSE, POCT (MANUAL RESULT ENTRY): POC GLUCOSE: 173 mg/dL — AB (ref 70–99)

## 2015-12-05 MED ORDER — TRAMADOL HCL 50 MG PO TABS: 50.0000 mg | ORAL_TABLET | Freq: Four times a day (QID) | ORAL | 0 refills | Status: DC | PRN

## 2015-12-05 MED ORDER — RAMELTEON 8 MG PO TABS: 8.0000 mg | ORAL_TABLET | Freq: Every day | ORAL | 0 refills | Status: DC

## 2015-12-05 MED ORDER — COLCHICINE 0.6 MG PO TABS
0.6000 mg | ORAL_TABLET | Freq: Every day | ORAL | 0 refills | Status: DC
Start: 2015-12-05 — End: 2016-10-21

## 2015-12-05 MED ORDER — METFORMIN HCL 500 MG PO TABS: 500.0000 mg | ORAL_TABLET | Freq: Two times a day (BID) | ORAL | 3 refills | Status: DC

## 2015-12-05 MED ORDER — NAPROXEN 500 MG PO TBEC: 500.0000 mg | DELAYED_RELEASE_TABLET | Freq: Two times a day (BID) | ORAL | 0 refills | Status: DC

## 2015-12-05 MED ORDER — LISINOPRIL 10 MG PO TABS: 10.0000 mg | ORAL_TABLET | Freq: Every day | ORAL | 3 refills | Status: DC

## 2015-12-05 MED ORDER — ASPIRIN 325 MG PO TBEC: 325.0000 mg | DELAYED_RELEASE_TABLET | Freq: Every day | ORAL | 3 refills | Status: DC

## 2015-12-05 MED ORDER — METOPROLOL TARTRATE 25 MG PO TABS: 12.5000 mg | ORAL_TABLET | Freq: Two times a day (BID) | ORAL | 3 refills | Status: DC

## 2015-12-05 MED FILL — ?METFORMIN HCL 500MG TABLET: 500 | 30 days supply | Qty: 60 | Fill #0

## 2015-12-05 MED FILL — NAPROXEN 500 MG TABLET: 500 | 15 days supply | Qty: 30 | Fill #0

## 2015-12-05 MED FILL — COLCHICINE 0.6 MG TABLET: 0.6 | 14 days supply | Qty: 14 | Fill #0

## 2015-12-05 MED FILL — ?METOPROLOL 25 MG TABLET: 25 | 30 days supply | Qty: 30 | Fill #0

## 2015-12-05 MED FILL — ?LISINOPRIL 10 MG TABLET: 10 | 30 days supply | Qty: 30 | Fill #0

## 2015-12-05 NOTE — Progress Notes (Signed)
Patient is here for SUrgery discomfort  Patient complains of clavical pain being present and scaled currently at a 4.  Patient has taken medication today. Patient has eaten today.  Patient declined flu vaccine today.

## 2015-12-05 NOTE — Patient Instructions (Signed)
Steps to Quit Smoking Smoking tobacco can be bad for your health. It can also affect almost every organ in your body. Smoking puts you and people around you at risk for many serious long-lasting (chronic) diseases. Quitting smoking is hard, but it is one of the best things that you can do for your health. It is never too late to quit. What are the benefits of quitting smoking? When you quit smoking, you lower your risk for getting serious diseases and conditions. They can include:  Lung cancer or lung disease.  Heart disease.  Stroke.  Heart attack.  Not being able to have children (infertility).  Weak bones (osteoporosis) and broken bones (fractures). If you have coughing, wheezing, and shortness of breath, those symptoms may get better when you quit. You may also get sick less often. If you are pregnant, quitting smoking can help to lower your chances of having a baby of low birth weight. What can I do to help me quit smoking? Talk with your doctor about what can help you quit smoking. Some things you can do (strategies) include:  Quitting smoking totally, instead of slowly cutting back how much you smoke over a period of time.  Going to in-person counseling. You are more likely to quit if you go to many counseling sessions.  Using resources and support systems, such as:  Online chats with a counselor.  Phone quitlines.  Printed self-help materials.  Support groups or group counseling.  Text messaging programs.  Mobile phone apps or applications.  Taking medicines. Some of these medicines may have nicotine in them. If you are pregnant or breastfeeding, do not take any medicines to quit smoking unless your doctor says it is okay. Talk with your doctor about counseling or other things that can help you. Talk with your doctor about using more than one strategy at the same time, such as taking medicines while you are also going to in-person counseling. This can help make quitting  easier. What things can I do to make it easier to quit? Quitting smoking might feel very hard at first, but there is a lot that you can do to make it easier. Take these steps:  Talk to your family and friends. Ask them to support and encourage you.  Call phone quitlines, reach out to support groups, or work with a counselor.  Ask people who smoke to not smoke around you.  Avoid places that make you want (trigger) to smoke, such as:  Bars.  Parties.  Smoke-break areas at work.  Spend time with people who do not smoke.  Lower the stress in your life. Stress can make you want to smoke. Try these things to help your stress:  Getting regular exercise.  Deep-breathing exercises.  Yoga.  Meditating.  Doing a body scan. To do this, close your eyes, focus on one area of your body at a time from head to toe, and notice which parts of your body are tense. Try to relax the muscles in those areas.  Download or buy apps on your mobile phone or tablet that can help you stick to your quit plan. There are many free apps, such as QuitGuide from the CDC (Centers for Disease Control and Prevention). You can find more support from smokefree.gov and other websites. This information is not intended to replace advice given to you by your health care provider. Make sure you discuss any questions you have with your health care provider. Document Released: 11/01/2008 Document Revised: 09/03/2015 Document   Reviewed: 05/22/2014 Elsevier Interactive Patient Education  2017 [employer] Coronary Artery Bypass Grafting, Care After These instructions give you information on caring for yourself after your procedure. Your doctor may also give you more specific instructions. Call your doctor if you have any problems or questions after your procedure. Follow these instructions at home:  Only take medicine as told by your doctor. Take medicines exactly as told. Do not stop taking medicines or start any new  medicines without talking to your doctor first.  Take your pulse as told by your doctor.  Do deep breathing as told by your doctor. Use your breathing device (incentive spirometer), if given, to practice deep breathing several times a day. Support your chest with a pillow or your arms when you take deep breaths or cough.  Keep the area clean, dry, and protected where the surgery cuts (incisions) were made. Remove bandages (dressings) only as told by your doctor. If strips were applied to surgical area, do not take them off. They fall off on their own.  Check the surgery area daily for puffiness (swelling), redness, or leaking fluid.  If surgery cuts were made in your legs:  Avoid crossing your legs.  Avoid sitting for long periods of time. Change positions every 30 minutes.  Raise your legs when you are sitting. Place them on pillows.  Wear stockings that help keep blood clots from forming in your legs (compression stockings).  Only take sponge baths until your doctor says it is okay to take showers. Pat the surgery area dry. Do not rub the surgery area with a washcloth or towel. Do not bathe, swim, or use a hot tub until your doctor says it is okay.  Eat foods that are high in fiber. These include raw fruits and vegetables, whole grains, beans, and nuts. Choose lean meats. Avoid canned, processed, and fried foods.  Drink enough fluids to keep your pee (urine) clear or pale yellow.  Weigh yourself every day.  Rest and limit activity as told by your doctor. You may be told to:  Stop any activity if you have chest pain, shortness of breath, changes in heartbeat, or dizziness. Get help right away if this happens.  Move around often for short amounts of time or take short walks as told by your doctor. Gradually become more active. You may need help to strengthen your muscles and build endurance.  Avoid lifting, pushing, or pulling anything heavier than 10 pounds (4.5 kg) for at least 6  weeks after surgery.  Do not drive until your doctor says it is okay.  Ask your doctor when you can go back to work.  Ask your doctor when you can begin sexual activity again.  Follow up with your doctor as told. Contact a doctor if:  You have puffiness, redness, more pain, or fluid draining from the incision site.  You have a fever.  You have puffiness in your ankles or legs.  You have pain in your legs.  You gain 2 or more pounds (0.9 kg) a day.  You feel sick to your stomach (nauseous) or throw up (vomit).  You have watery poop (diarrhea). Get help right away if:  You have chest pain that goes to your jaw or arms.  You have shortness of breath.  You have a fast or irregular heartbeat.  You notice a "clicking" in your breastbone when you move.  You have numbness or weakness in your arms or legs.  You feel dizzy or light-headed. This  information is not intended to replace advice given to you by your health care provider. Make sure you discuss any questions you have with your health care provider. Document Released: 01/10/2013 Document Revised: 06/13/2015 Document Reviewed: 06/14/2012 Elsevier Interactive Patient Education  2017 [employer]

## 2015-12-05 NOTE — Progress Notes (Signed)
Willie Walker, is a 50 y.o. male  WUJ:[id number]:[id number]  [id number]  DOB - 03/01/1965  Chief Complaint  Patient presents with  . Post-op Problem       Subjective:   Willie Walker is a 50 y.o. male with a history of CAD s/p multiple PCIs and CABG x5V (07/2015), HTN, HLD, PVD and ongoing polysubstance abuse.  History of CAD and multiple PCI procedures in the past. He had several stents placed in [place] with full metal jacket in the RCA and stent to the intermediate. LHC (8/292013) for chest pain showed: dLM 30%, mLAD 60%, dLAD 40%, IM 30% ISR, D1 40-50%, OM/AV groove branch 95% ISR, pRCA 90% SIR, dRCA 80% ISR, EF 60%. Subsequently underwent cutting Balloon angioplasty to the mid RCA; Cutting Balloon angioplasty to the mid circumflex. Last seen by Dr. Eden EmmsNishan 12/2013. Per note patient patient was enrolled in [employer] trial 08/29/14.   He was admitted from 7/13 - 08/10/15. He underwent CABG x5V utilizing LIMA to LAD, SVG to Diagonal, Sequential SVG to Ramus Intermediate and OM, and Radial artery to the PDA on 08/05/15 with Dr. Dorris FetchHendrickson.He also underwent open harvest of his left radial artery and endoscopic harvest of greater saphenous vein from right thigh. His hospital course was complicated by pericarditis and he was started on Colchicine as well as significant volume overload requiring diuresis. His creat did become elevated and Lisinopril was discontinued. Creat 1.45 at discharge  He is here today for a follow up visit. He complained of ongoing chest pain post surgery, worse when lying down flat and better when leaning forward. 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 He experiences mild DOE, PND or orthopnea. BP is controlled. Patient has No headache, No chest pain, No abdominal pain - No Nausea, No new weakness tingling or numbness. He is still unable to sleep well due to pain from his surgery. He does not smoke cigarette. He needs refill of his medications. Follows  up regularly with cardiologist.  No problems updated.  ALLERGIES: Allergies  Allergen Reactions  . Crestor [Rosuvastatin] Other (See Comments)    "Makes my legs hurt" - tolerates simvastatin     PAST MEDICAL HISTORY: Past Medical History:  Diagnosis Date  . Anginal pain (HCC)   . Anxiety   . Childhood asthma   . Chronic lower back pain   . Coronary artery disease   . ELECTROCARDIOGRAM, ABNORMAL   . GERD (gastroesophageal reflux disease)   . Gout   . Headache(784.0) 09/18/2011   "maybe twice/wk; pressure on the brain"  . High cholesterol   . Hypertension   . Kidney stones   . Lower GI bleed 09/18/2011   "clots and everything; not lately"  . Myocardial infarction 10/2005  . Peripheral vascular disease (HCC)    LLE  . Pneumonia 1990's  . Seizures (HCC) 09/18/2011   "blanks out on me"/wife's report  . Shortness of breath 09/18/11   "@ rest; lying down; w/exertion"  . Type I diabetes mellitus (HCC)     MEDICATIONS AT HOME: Prior to Admission medications   Medication Sig Start Date End Date Taking? Authorizing Provider  acetaminophen (TYLENOL) 500 MG tablet Take 2 tablets (1,000 mg total) by mouth every 6 (six) hours as needed for mild pain or fever. 08/10/15  Yes Erin R Barrett, PA-C  aspirin 325 MG EC tablet Take 1 tablet (325 mg total) by mouth daily. 12/05/15  Yes Quentin Angstlugbemiga E Gwendolin Briel, MD  fish oil-omega-3 fatty acids 1000  MG capsule Take 1 g by mouth 2 (two) times daily.   Yes Historical Provider, MD  levothyroxine (SYNTHROID, LEVOTHROID) 50 MCG tablet Take 50 mcg by mouth daily before breakfast. X 3 WKS   Yes Historical Provider, MD  lisinopril (PRINIVIL,ZESTRIL) 10 MG tablet Take 1 tablet (10 mg total) by mouth daily. 12/05/15  Yes Quentin Angstlugbemiga E Dreshaun Stene, MD  metFORMIN (GLUCOPHAGE) 500 MG tablet Take 1 tablet (500 mg total) by mouth 2 (two) times daily with a meal. 12/05/15  Yes Rajat Staver E Hyman HopesJegede, MD  metoprolol tartrate (LOPRESSOR) 25 MG tablet Take 0.5 tablets (12.5 mg  total) by mouth 2 (two) times daily. 12/05/15  Yes Quentin Angstlugbemiga E Derion Kreiter, MD  Multiple Vitamins-Minerals (MULTIVITAMIN WITH MINERALS) tablet Take 1 tablet by mouth daily.   Yes Historical Provider, MD  ondansetron (ZOFRAN) 4 MG tablet Take 4 mg by mouth every 4 (four) hours as needed for nausea or vomiting.   Yes Historical Provider, MD  oxyCODONE (OXY IR/ROXICODONE) 5 MG immediate release tablet Take 1-2 tablets (5-10 mg total) by mouth every 6 (six) hours as needed for severe pain. 09/24/15  Yes Loreli SlotSteven C Hendrickson, MD  sucralfate (CARAFATE) 1 g tablet Take 1 g by mouth 4 (four) times daily -  with meals and at bedtime.   Yes Historical Provider, MD  traMADol (ULTRAM) 50 MG tablet Take 1 tablet (50 mg total) by mouth every 6 (six) hours as needed. 12/05/15  Yes Quentin Angstlugbemiga E Bookert Guzzi, MD  zolpidem (AMBIEN) 5 MG tablet Take 1 tablet (5 mg total) by mouth at bedtime as needed for sleep. 08/22/15  Yes Janetta HoraKathryn R Thompson, PA-C  allopurinol (ZYLOPRIM) 100 MG tablet Take 1 tablet (100 mg total) by mouth daily. Patient not taking: Reported on 12/05/2015 11/01/14   Quentin Angstlugbemiga E Sadik Piascik, MD  ALPRAZolam Prudy Feeler(XANAX) 0.25 MG tablet Take 0.25 mg by mouth 2 (two) times daily as needed for anxiety.    Historical Provider, MD  colchicine 0.6 MG tablet Take 1 tablet (0.6 mg total) by mouth daily. 12/05/15   Quentin Angstlugbemiga E Rubee Vega, MD  naproxen (EC NAPROSYN) 500 MG EC tablet Take 1 tablet (500 mg total) by mouth 2 (two) times daily with a meal. 12/05/15   Nithin Demeo E Hyman HopesJegede, MD  ramelteon (ROZEREM) 8 MG tablet Take 1 tablet (8 mg total) by mouth at bedtime. 12/05/15   Quentin Angstlugbemiga E Greg Eckrich, MD    Objective:   Vitals:   12/05/15 1111  BP: 122/76  Pulse: (!) 55  Resp: 18  Temp: 98.4 F (36.9 C)  TempSrc: Oral  SpO2: 98%  Weight: 164 lb 12.8 oz (74.8 kg)  Height: 5\' 11"  (1.803 m)   Exam General appearance : Awake, alert, not in any distress. Speech Clear. Not toxic looking HEENT: Atraumatic and Normocephalic, pupils  equally reactive to light and accomodation Neck: Supple, no JVD. No cervical lymphadenopathy.  Chest: Surgical scar, healed. Good air entry bilaterally, no added sounds  CVS: S1 S2 regular, no murmurs.  Abdomen: Bowel sounds present, Non tender and not distended with no gaurding, rigidity or rebound. Extremities: B/L Lower Ext shows no edema, both legs are warm to touch Neurology: Awake alert, and oriented X 3, CN II-XII intact, Non focal Skin: No Rash  Data Review Lab Results  Component Value Date   HGBA1C 5.9 (H) 08/04/2015   HGBA1C 5.7 (H) 08/02/2015   HGBA1C 5.7 07/25/2015    Assessment & Plan   1. Type 2 diabetes mellitus without complication, without long-term current use of insulin (HCC)  -  Glucose (CBG) - Microalbumin/Creatinine Ratio, Urine - metFORMIN (GLUCOPHAGE) 500 MG tablet; Take 1 tablet (500 mg total) by mouth 2 (two) times daily with a meal.  Dispense: 180 tablet; Refill: 3  2. Essential hypertension, benign  - metoprolol tartrate (LOPRESSOR) 25 MG tablet; Take 0.5 tablets (12.5 mg total) by mouth 2 (two) times daily.  Dispense: 180 tablet; Refill: 3 - lisinopril (PRINIVIL,ZESTRIL) 10 MG tablet; Take 1 tablet (10 mg total) by mouth daily.  Dispense: 90 tablet; Refill: 3 - aspirin 325 MG EC tablet; Take 1 tablet (325 mg total) by mouth daily.  Dispense: 60 tablet; Refill: 3  3. Post-op pain  - traMADol (ULTRAM) 50 MG tablet; Take 1 tablet (50 mg total) by mouth every 6 (six) hours as needed.  Dispense: 90 tablet; Refill: 0  4. S/P CABG x 5  - ECHOCARDIOGRAM COMPLETE; Future - Referral to Cardiac Rehabilitation  5. Acute idiopathic pericarditis Prescribed - naproxen (EC NAPROSYN) 500 MG EC tablet; Take 1 tablet (500 mg total) by mouth 2 (two) times daily with a meal.  Dispense: 30 tablet; Refill: 0 - colchicine 0.6 MG tablet; Take 1 tablet (0.6 mg total) by mouth daily.  Dispense: 14 tablet; Refill: 0 - ECHOCARDIOGRAM COMPLETE; Future  6. Adjustment  insomnia  - ramelteon (ROZEREM) 8 MG tablet; Take 1 tablet (8 mg total) by mouth at bedtime.  Dispense: 30 tablet; Refill: 0  Patient have been counseled extensively about nutrition and exercise. Other issues discussed during this visit include: low cholesterol diet, weight control and daily exercise, foot care, annual eye examinations at Ophthalmology, importance of adherence with medications and regular follow-up. We also discussed long term complications of uncontrolled diabetes and hypertension.   Return in about 4 weeks (around 01/02/2016) for Heart Failure and Hypertension, Dyslipidemia, Follow up Pain and comorbidities.  The patient was given clear instructions to go to ER or return to medical center if symptoms don't improve, worsen or new problems develop. The patient verbalized understanding. The patient was told to call to get lab results if they haven't heard anything in the next week.   This note has been created with Education officer, environmental. Any transcriptional errors are unintentional.    Jeanann Lewandowsky, MD, MHA, Maxwell Caul, CPE [facility] and [facility] [place], [place] [phone]   12/05/2015, 11:59 AM

## 2015-12-06 LAB — MICROALBUMIN / CREATININE URINE RATIO
Creatinine, Urine: 189 mg/dL (ref 20–370)
MICROALB/CREAT RATIO: 59 ug/mg{creat} — AB (ref <30)
Microalb, Ur: 11.1 mg/dL

## 2015-12-20 ENCOUNTER — Telehealth (HOSPITAL_COMMUNITY): Payer: Self-pay | Admitting: Cardiac Rehabilitation

## 2015-12-20 NOTE — Telephone Encounter (Signed)
pc to pt to discuss enrolling in cardiac rehab now that medicaid active.  [facility].   Medicaid order faxed to DR. Nishan for signature.

## 2016-01-21 ENCOUNTER — Telehealth (HOSPITAL_COMMUNITY): Payer: Self-pay | Admitting: Cardiac Rehabilitation

## 2016-01-21 NOTE — Telephone Encounter (Signed)
pc to pt to advise of need to cancel cardiac rehab orientation until completed medicaid order received from Dr. Eden EmmsNishan.  Pt verbalized understanding

## 2016-02-04 ENCOUNTER — Telehealth (HOSPITAL_COMMUNITY): Payer: Self-pay | Admitting: Cardiac Rehabilitation

## 2016-02-04 ENCOUNTER — Inpatient Hospital Stay (HOSPITAL_COMMUNITY): Admission: RE | Admit: 2016-02-04 | Payer: Medicaid Other | Source: Ambulatory Visit

## 2016-02-04 NOTE — Telephone Encounter (Signed)
Pt no show for Cardiac Rehab orientation.  [facility] to [facility], this was last possible day for pt to begin program.  Pt was informed of this and verbalized understanding.

## 2016-02-10 ENCOUNTER — Ambulatory Visit (HOSPITAL_COMMUNITY): Payer: Medicaid Other

## 2016-02-12 ENCOUNTER — Ambulatory Visit (HOSPITAL_COMMUNITY): Payer: Medicaid Other

## 2016-02-14 ENCOUNTER — Ambulatory Visit (HOSPITAL_COMMUNITY): Payer: Medicaid Other

## 2016-02-14 ENCOUNTER — Other Ambulatory Visit: Payer: Self-pay | Admitting: Internal Medicine

## 2016-02-14 DIAGNOSIS — I300 Acute nonspecific idiopathic pericarditis: Secondary | ICD-10-CM

## 2016-02-14 MED FILL — NAPROXEN 500 MG TABLET: 500 | 15 days supply | Qty: 30 | Fill #0

## 2016-02-14 MED FILL — metFORMIN HCL 500 MG TABS: 500 | 30 days supply | Qty: 60 | Fill #1

## 2016-02-14 MED FILL — METOPROLOL TARTRATE 25 MG T: 25 | 30 days supply | Qty: 30 | Fill #1

## 2016-02-14 MED FILL — LISINOPRIL 10 MG TABLET: 10 | 30 days supply | Qty: 30 | Fill #1

## 2016-02-17 ENCOUNTER — Ambulatory Visit (HOSPITAL_COMMUNITY): Payer: Medicaid Other

## 2016-02-19 ENCOUNTER — Ambulatory Visit: Payer: Medicaid Other | Attending: Internal Medicine | Admitting: Internal Medicine

## 2016-02-19 ENCOUNTER — Encounter: Payer: Self-pay | Admitting: Internal Medicine

## 2016-02-19 ENCOUNTER — Ambulatory Visit (HOSPITAL_COMMUNITY): Payer: Medicaid Other

## 2016-02-19 VITALS — BP 116/77 | HR 78 | Temp 97.3°F | Ht 71.0 in | Wt 168.4 lb

## 2016-02-19 DIAGNOSIS — N521 Erectile dysfunction due to diseases classified elsewhere: Secondary | ICD-10-CM

## 2016-02-19 DIAGNOSIS — H6690 Otitis media, unspecified, unspecified ear: Secondary | ICD-10-CM | POA: Insufficient documentation

## 2016-02-19 DIAGNOSIS — E1169 Type 2 diabetes mellitus with other specified complication: Secondary | ICD-10-CM

## 2016-02-19 DIAGNOSIS — G8918 Other acute postprocedural pain: Secondary | ICD-10-CM | POA: Diagnosis not present

## 2016-02-19 DIAGNOSIS — H65192 Other acute nonsuppurative otitis media, left ear: Secondary | ICD-10-CM | POA: Diagnosis not present

## 2016-02-19 DIAGNOSIS — H9202 Otalgia, left ear: Secondary | ICD-10-CM | POA: Diagnosis present

## 2016-02-19 LAB — GLUCOSE, POCT (MANUAL RESULT ENTRY): POC GLUCOSE: 137 mg/dL — AB (ref 70–99)

## 2016-02-19 LAB — POCT GLYCOSYLATED HEMOGLOBIN (HGB A1C): HEMOGLOBIN A1C: 6.1

## 2016-02-19 MED ORDER — NEOMYCIN-POLYMYXIN-HC 3.5-10000-1 OT SOLN: 3.0000 [drp] | Freq: Four times a day (QID) | OTIC | 0 refills | Status: DC

## 2016-02-19 MED ORDER — ZOLPIDEM TARTRATE 5 MG PO TABS: 5.0000 mg | ORAL_TABLET | Freq: Every evening | ORAL | 0 refills | Status: DC | PRN

## 2016-02-19 MED ORDER — TRAMADOL HCL 50 MG PO TABS: 50.0000 mg | ORAL_TABLET | Freq: Four times a day (QID) | ORAL | 0 refills | Status: DC | PRN

## 2016-02-19 MED ORDER — AMOXICILLIN-POT CLAVULANATE 875-125 MG PO TABS: 1.0000 | ORAL_TABLET | Freq: Two times a day (BID) | ORAL | 0 refills | Status: DC

## 2016-02-19 MED FILL — AMOX-CLAV 875-125 MG TABLET: 875-125 | 10 days supply | Qty: 20 | Fill #0

## 2016-02-19 MED FILL — NEO/POLYMYXIN/HC EAR SUSP: 3.5-10000-1 | 7 days supply | Qty: 10 | Fill #0

## 2016-02-19 MED FILL — traMADol HCL 50 MG TABS: 50 | 23 days supply | Qty: 90 | Fill #0

## 2016-02-19 NOTE — Progress Notes (Signed)
Pt is having pain in left ear. Pt would like a refill on tramadol and Ambien.

## 2016-02-19 NOTE — Patient Instructions (Signed)
Otitis Media, Adult Otitis media is redness, soreness, and inflammation of the middle ear. Otitis media may be caused by allergies or, most commonly, by infection. Often it occurs as a complication of the common cold. What are the signs or symptoms? Symptoms of otitis media may include:  Earache.  Fever.  Ringing in your ear.  Headache.  Leakage of fluid from the ear. How is this diagnosed? To diagnose otitis media, your health care provider will examine your ear with an otoscope. This is an instrument that allows your health care provider to see into your ear in order to examine your eardrum. Your health care provider also will ask you questions about your symptoms. How is this treated? Typically, otitis media resolves on its own within 3-5 days. Your health care provider may prescribe medicine to ease your symptoms of pain. If otitis media does not resolve within 5 days or is recurrent, your health care provider may prescribe antibiotic medicines if he or she suspects that a bacterial infection is the cause. Follow these instructions at home:  If you were prescribed an antibiotic medicine, finish it all even if you start to feel better.  Take medicines only as directed by your health care provider.  Keep all follow-up visits as directed by your health care provider. Contact a health care provider if:  You have otitis media only in one ear, or bleeding from your nose, or both.  You notice a lump on your neck.  You are not getting better in 3-5 days.  You feel worse instead of better. Get help right away if:  You have pain that is not controlled with medicine.  You have swelling, redness, or pain around your ear or stiffness in your neck.  You notice that part of your face is paralyzed.  You notice that the bone behind your ear (mastoid) is tender when you touch it. This information is not intended to replace advice given to you by your health care provider. Make sure you  discuss any questions you have with your health care provider. Document Released: 10/11/2003 Document Revised: 06/13/2015 Document Reviewed: 08/02/2012 Elsevier Interactive Patient Education  2017 Elsevier Inc.  

## 2016-02-19 NOTE — Progress Notes (Signed)
Subjective:     Willie Walker is a 51 y.o. male who presents with ear pain and possible ear infection. Symptoms include: left ear pain. Onset of symptoms was 2 weeks ago, and have been gradually worsening since that time. Associated symptoms include: achiness and headache, (headache mostly at the back of his head), left neck pain with swallowing. Patient denies: chills, congestion, coryza, fever , low grade fever and sinus pressure. He is drinking plenty of fluids. The following portions of the patient's history were reviewed and updated as appropriate: allergies, current medications, past family history, past medical history, past social history, past surgical history and problem list. Patient denied smoking cigarette but endorses using marijuana.  Review of Systems Pertinent items noted in HPI and remainder of comprehensive ROS otherwise negative.   Objective:    BP 116/77 (BP Location: Left Arm, Patient Position: Sitting, Cuff Size: Small)   Pulse 78   Temp 97.3 F (36.3 C) (Oral)   Ht 5\' 11"  (1.803 m)   Wt 168 lb 6.4 oz (76.4 kg)   SpO2 97%   BMI 23.49 kg/m  General:  alert, cooperative, appears stated age and mild distress  Right Ear: normal landmarks and mobility  Left Ear: inflamed, with mucoid discharge and tender with movement of pinna  Mouth:  lips, mucosa, and tongue normal; teeth and gums normal  Neck: moderate anterior cervical adenopathy, no carotid bruit and thyroid not enlarged, symmetric, no tenderness/mass/nodules     Assessment:    Left acute otitis media   Plan:    Treatment: Augmentin OTC analgesia as needed. Fluids, rest, avoid carbonated/alcoholic and caffeinated beverages.  Follow up in 2 days if not improving. or if symptom worsens  Assessment & Plan    Other acute nonsuppurative otitis media of left ear, recurrence not specified  - amoxicillin-clavulanate (AUGMENTIN) 875-125 MG tablet; Take 1 tablet by mouth 2 (two) times daily.  Dispense: 20  tablet; Refill: 0 - neomycin-polymyxin-hydrocortisone (CORTISPORIN) otic solution; Place 3 drops into the left ear 4 (four) times daily.  Dispense: 10 mL; Refill: 0 - zolpidem (AMBIEN) 5 MG tablet; Take 1 tablet (5 mg total) by mouth at bedtime as needed for sleep.  Dispense: 30 tablet; Refill: 0 - traMADol (ULTRAM) 50 MG tablet; Take 1 tablet (50 mg total) by mouth every 6 (six) hours as needed.  Dispense: 90 tablet; Refill: 0  Patient have been counseled extensively about nutrition and exercise.    Return in about 6 months (around 08/18/2016) for Routine Follow Up, Follow up Pain and comorbidities.  The patient was given clear instructions to go to ER or return to medical center if symptoms don't improve, worsen or new problems develop. The patient verbalized understanding. The patient was told to call to get lab results if they haven't heard anything in the next week.   This note has been created with Education officer, environmentalDragon speech recognition software and smart phrase technology. Any transcriptional errors are unintentional.    Jeanann LewandowskyJEGEDE, OLUGBEMIGA, MD, MHA, FACP, FAAP, CPE [facility] [place], [place] [phone]   02/28/2016, 6:20 PM

## 2016-02-21 ENCOUNTER — Ambulatory Visit (HOSPITAL_COMMUNITY): Payer: Medicaid Other

## 2016-02-24 ENCOUNTER — Ambulatory Visit (HOSPITAL_COMMUNITY): Payer: Medicaid Other

## 2016-02-26 ENCOUNTER — Ambulatory Visit (HOSPITAL_COMMUNITY): Payer: Medicaid Other

## 2016-02-28 ENCOUNTER — Ambulatory Visit (HOSPITAL_COMMUNITY): Payer: Medicaid Other

## 2016-03-02 ENCOUNTER — Ambulatory Visit (HOSPITAL_COMMUNITY): Payer: Medicaid Other

## 2016-03-04 ENCOUNTER — Ambulatory Visit (HOSPITAL_COMMUNITY): Payer: Medicaid Other

## 2016-03-06 ENCOUNTER — Ambulatory Visit (HOSPITAL_COMMUNITY): Payer: Medicaid Other

## 2016-03-09 ENCOUNTER — Ambulatory Visit (HOSPITAL_COMMUNITY): Payer: Medicaid Other

## 2016-03-11 ENCOUNTER — Ambulatory Visit (HOSPITAL_COMMUNITY): Payer: Medicaid Other

## 2016-03-13 ENCOUNTER — Ambulatory Visit (HOSPITAL_COMMUNITY): Payer: Medicaid Other

## 2016-03-16 ENCOUNTER — Ambulatory Visit (HOSPITAL_COMMUNITY): Payer: Medicaid Other

## 2016-03-18 ENCOUNTER — Ambulatory Visit (HOSPITAL_COMMUNITY): Payer: Medicaid Other

## 2016-03-20 ENCOUNTER — Ambulatory Visit (HOSPITAL_COMMUNITY): Payer: Medicaid Other

## 2016-03-23 ENCOUNTER — Ambulatory Visit (HOSPITAL_COMMUNITY): Payer: Medicaid Other

## 2016-03-25 ENCOUNTER — Ambulatory Visit (HOSPITAL_COMMUNITY): Payer: Medicaid Other

## 2016-03-27 ENCOUNTER — Ambulatory Visit (HOSPITAL_COMMUNITY): Payer: Medicaid Other

## 2016-03-30 ENCOUNTER — Ambulatory Visit (HOSPITAL_COMMUNITY): Payer: Medicaid Other

## 2016-04-01 ENCOUNTER — Ambulatory Visit (HOSPITAL_COMMUNITY): Payer: Medicaid Other

## 2016-04-02 MED FILL — METOPROLOL TARTRATE 25 MG T: 25 | 30 days supply | Qty: 30 | Fill #2

## 2016-04-02 MED FILL — metFORMIN HCL 500 MG TABS: 500 | 30 days supply | Qty: 60 | Fill #2

## 2016-04-02 MED FILL — LISINOPRIL 10 MG TABLET: 10 | 30 days supply | Qty: 30 | Fill #2

## 2016-04-03 ENCOUNTER — Ambulatory Visit (HOSPITAL_COMMUNITY): Payer: Medicaid Other

## 2016-04-06 ENCOUNTER — Ambulatory Visit (HOSPITAL_COMMUNITY): Payer: Medicaid Other

## 2016-04-08 ENCOUNTER — Ambulatory Visit (HOSPITAL_COMMUNITY): Payer: Medicaid Other

## 2016-04-10 ENCOUNTER — Ambulatory Visit (HOSPITAL_COMMUNITY): Payer: Medicaid Other

## 2016-04-13 ENCOUNTER — Ambulatory Visit (HOSPITAL_COMMUNITY): Payer: Medicaid Other

## 2016-04-15 ENCOUNTER — Ambulatory Visit (HOSPITAL_COMMUNITY): Payer: Medicaid Other

## 2016-04-17 ENCOUNTER — Ambulatory Visit (HOSPITAL_COMMUNITY): Payer: Medicaid Other

## 2016-04-20 ENCOUNTER — Ambulatory Visit (HOSPITAL_COMMUNITY): Payer: Medicaid Other

## 2016-04-22 ENCOUNTER — Other Ambulatory Visit: Payer: Self-pay

## 2016-04-22 ENCOUNTER — Ambulatory Visit (HOSPITAL_COMMUNITY): Payer: Medicaid Other

## 2016-04-22 ENCOUNTER — Ambulatory Visit: Payer: Medicaid Other | Attending: Internal Medicine | Admitting: Internal Medicine

## 2016-04-22 DIAGNOSIS — G8918 Other acute postprocedural pain: Secondary | ICD-10-CM | POA: Insufficient documentation

## 2016-04-22 DIAGNOSIS — Z7984 Long term (current) use of oral hypoglycemic drugs: Secondary | ICD-10-CM | POA: Insufficient documentation

## 2016-04-22 DIAGNOSIS — Z79899 Other long term (current) drug therapy: Secondary | ICD-10-CM | POA: Insufficient documentation

## 2016-04-22 DIAGNOSIS — F5102 Adjustment insomnia: Secondary | ICD-10-CM | POA: Insufficient documentation

## 2016-04-22 DIAGNOSIS — E119 Type 2 diabetes mellitus without complications: Secondary | ICD-10-CM | POA: Insufficient documentation

## 2016-04-22 DIAGNOSIS — E039 Hypothyroidism, unspecified: Secondary | ICD-10-CM | POA: Diagnosis not present

## 2016-04-22 DIAGNOSIS — Z7982 Long term (current) use of aspirin: Secondary | ICD-10-CM | POA: Diagnosis not present

## 2016-04-22 DIAGNOSIS — R001 Bradycardia, unspecified: Secondary | ICD-10-CM | POA: Diagnosis not present

## 2016-04-22 DIAGNOSIS — I10 Essential (primary) hypertension: Secondary | ICD-10-CM | POA: Diagnosis not present

## 2016-04-22 LAB — GLUCOSE, POCT (MANUAL RESULT ENTRY): POC Glucose: 134 mg/dl — AB (ref 70–99)

## 2016-04-22 MED ORDER — METFORMIN HCL 500 MG PO TABS: 500.0000 mg | ORAL_TABLET | Freq: Two times a day (BID) | ORAL | 3 refills | Status: DC

## 2016-04-22 MED ORDER — METOPROLOL TARTRATE 25 MG PO TABS: 12.5000 mg | ORAL_TABLET | Freq: Two times a day (BID) | ORAL | 3 refills | Status: DC

## 2016-04-22 MED ORDER — LISINOPRIL 10 MG PO TABS
10.0000 mg | ORAL_TABLET | Freq: Every day | ORAL | 3 refills | Status: DC
Start: 2016-04-22 — End: 2016-10-21

## 2016-04-22 MED ORDER — LEVOTHYROXINE SODIUM 50 MCG PO TABS: 50.0000 ug | ORAL_TABLET | Freq: Every day | ORAL | 3 refills | Status: DC

## 2016-04-22 MED ORDER — TRAMADOL HCL 50 MG PO TABS: 50.0000 mg | ORAL_TABLET | Freq: Four times a day (QID) | ORAL | 0 refills | Status: DC | PRN

## 2016-04-22 MED ORDER — RAMELTEON 8 MG PO TABS: 8.0000 mg | ORAL_TABLET | Freq: Every day | ORAL | 0 refills | Status: DC

## 2016-04-22 MED FILL — traMADol HCL 50 MG TABS: 50 | 22 days supply | Qty: 90 | Fill #0

## 2016-04-22 MED FILL — LEVOTHYROXINE 50 MCG TABLET: 50 | 30 days supply | Qty: 30 | Fill #0

## 2016-04-22 NOTE — Patient Instructions (Signed)
Diabetes Mellitus and Food It is important for you to manage your blood sugar (glucose) level. Your blood glucose level can be greatly affected by what you eat. Eating healthier foods in the appropriate amounts throughout the day at about the same time each day will help you control your blood glucose level. It can also help slow or prevent worsening of your diabetes mellitus. Healthy eating may even help you improve the level of your blood pressure and reach or maintain a healthy weight. General recommendations for healthful eating and cooking habits include:  Eating meals and snacks regularly. Avoid going long periods of time without eating to lose weight.  Eating a diet that consists mainly of plant-based foods, such as fruits, vegetables, nuts, legumes, and whole grains.  Using low-heat cooking methods, such as baking, instead of high-heat cooking methods, such as deep frying.  Work with your dietitian to make sure you understand how to use the Nutrition Facts information on food labels. How can food affect me? Carbohydrates Carbohydrates affect your blood glucose level more than any other type of food. Your dietitian will help you determine how many carbohydrates to eat at each meal and teach you how to count carbohydrates. Counting carbohydrates is important to keep your blood glucose at a healthy level, especially if you are using insulin or taking certain medicines for diabetes mellitus. Alcohol Alcohol can cause sudden decreases in blood glucose (hypoglycemia), especially if you use insulin or take certain medicines for diabetes mellitus. Hypoglycemia can be a life-threatening condition. Symptoms of hypoglycemia (sleepiness, dizziness, and disorientation) are similar to symptoms of having too much alcohol. If your health care provider has given you approval to drink alcohol, do so in moderation and use the following guidelines:  Women should not have more than one drink per day, and men  should not have more than two drinks per day. One drink is equal to: ? 12 oz of beer. ? 5 oz of wine. ? 1 oz of hard liquor.  Do not drink on an empty stomach.  Keep yourself hydrated. Have water, diet soda, or unsweetened iced tea.  Regular soda, juice, and other mixers might contain a lot of carbohydrates and should be counted.  What foods are not recommended? As you make food choices, it is important to remember that all foods are not the same. Some foods have fewer nutrients per serving than other foods, even though they might have the same number of calories or carbohydrates. It is difficult to get your body what it needs when you eat foods with fewer nutrients. Examples of foods that you should avoid that are high in calories and carbohydrates but low in nutrients include:  Trans fats (most processed foods list trans fats on the Nutrition Facts label).  Regular soda.  Juice.  Candy.  Sweets, such as cake, pie, doughnuts, and cookies.  Fried foods.  What foods can I eat? Eat nutrient-rich foods, which will nourish your body and keep you healthy. The food you should eat also will depend on several factors, including:  The calories you need.  The medicines you take.  Your weight.  Your blood glucose level.  Your blood pressure level.  Your cholesterol level.  You should eat a variety of foods, including:  Protein. ? Lean cuts of meat. ? Proteins low in saturated fats, such as fish, egg whites, and beans. Avoid processed meats.  Fruits and vegetables. ? Fruits and vegetables that may help control blood glucose levels, such as apples,   mangoes, and yams.  Dairy products. ? Choose fat-free or low-fat dairy products, such as milk, yogurt, and cheese.  Grains, bread, pasta, and rice. ? Choose whole grain products, such as multigrain bread, whole oats, and brown rice. These foods may help control blood pressure.  Fats. ? Foods containing healthful fats, such as  nuts, avocado, olive oil, canola oil, and fish.  Does everyone with diabetes mellitus have the same meal plan? Because every person with diabetes mellitus is different, there is not one meal plan that works for everyone. It is very important that you meet with a dietitian who will help you create a meal plan that is just right for you. This information is not intended to replace advice given to you by your health care provider. Make sure you discuss any questions you have with your health care provider. Document Released: 10/02/2004 Document Revised: 06/13/2015 Document Reviewed: 12/02/2012 Elsevier Interactive Patient Education  2017 [employer] Blood Glucose Monitoring, Adult Monitoring your blood sugar (glucose) helps you manage your diabetes. It also helps you and your health care provider determine how well your diabetes management plan is working. Blood glucose monitoring involves checking your blood glucose as often as directed, and keeping a record (log) of your results over time. Why should I monitor my blood glucose? Checking your blood glucose regularly can:  Help you understand how food, exercise, illnesses, and medicines affect your blood glucose.  Let you know what your blood glucose is at any time. You can quickly tell if you are having low blood glucose (hypoglycemia) or high blood glucose (hyperglycemia).  Help you and your health care provider adjust your medicines as needed.  When should I check my blood glucose? Follow instructions from your health care provider about how often to check your blood glucose. This may depend on:  The type of diabetes you have.  How well-controlled your diabetes is.  Medicines you are taking.  If you have type 1 diabetes:  Check your blood glucose at least 2 times a day.  Also check your blood glucose: ? Before every insulin injection. ? Before and after exercise. ? Between meals. ? 2 hours after a meal. ? Occasionally between  2:00 a.m. and 3:00 a.m., as directed. ? Before potentially dangerous tasks, like driving or using heavy machinery. ? At bedtime.  You may need to check your blood glucose more often, up to 6-10 times a day: ? If you use an insulin pump. ? If you need multiple daily injections (MDI). ? If your diabetes is not well-controlled. ? If you are ill. ? If you have a history of severe hypoglycemia. ? If you have a history of not knowing when your blood glucose is getting low (hypoglycemia unawareness). If you have type 2 diabetes:  If you take insulin or other diabetes medicines, check your blood glucose at least 2 times a day.  If you are on intensive insulin therapy, check your blood glucose at least 4 times a day. Occasionally, you may also need to check between 2:00 a.m. and 3:00 a.m., as directed.  Also check your blood glucose: ? Before and after exercise. ? Before potentially dangerous tasks, like driving or using heavy machinery.  You may need to check your blood glucose more often if: ? Your medicine is being adjusted. ? Your diabetes is not well-controlled. ? You are ill. What is a blood glucose log?  A blood glucose log is a record of your blood glucose readings. It helps you   and your health care provider: ? Look for patterns in your blood glucose over time. ? Adjust your diabetes management plan as needed.  Every time you check your blood glucose, write down your result and notes about things that may be affecting your blood glucose, such as your diet and exercise for the day.  Most glucose meters store a record of glucose readings in the meter. Some meters allow you to download your records to a computer. How do I check my blood glucose? Follow these steps to get accurate readings of your blood glucose: Supplies needed   Blood glucose meter.  Test strips for your meter. Each meter has its own strips. You must use the strips that come with your meter.  A needle to prick  your finger (lancet). Do not use lancets more than once.  A device that holds the lancet (lancing device).  A journal or log book to write down your results. Procedure  Wash your hands with soap and water.  Prick the side of your finger (not the tip) with the lancet. Use a different finger each time.  Gently rub the finger until a small drop of blood appears.  Follow instructions that come with your meter for inserting the test strip, applying blood to the strip, and using your blood glucose meter.  Write down your result and any notes. Alternative testing sites  Some meters allow you to use areas of your body other than your finger (alternative sites) to test your blood.  If you think you may have hypoglycemia, or if you have hypoglycemia unawareness, do not use alternative sites. Use your finger instead.  Alternative sites may not be as accurate as the fingers, because blood flow is slower in these areas. This means that the result you get may be delayed, and it may be different from the result that you would get from your finger.  The most common alternative sites are: ? Forearm. ? Thigh. ? Palm of the hand. Additional tips  Always keep your supplies with you.  If you have questions or need help, all blood glucose meters have a 24-hour "hotline" number that you can call. You may also contact your health care provider.  After you use a few boxes of test strips, adjust (calibrate) your blood glucose meter by following instructions that came with your meter. This information is not intended to replace advice given to you by your health care provider. Make sure you discuss any questions you have with your health care provider. Document Released: 01/08/2003 Document Revised: 07/26/2015 Document Reviewed: 06/17/2015 Elsevier Interactive Patient Education  2017 [employer] Inc.  

## 2016-04-22 NOTE — Progress Notes (Signed)
Platon Arocho, is a 51 y.o. male  [id number]  [id number]  DOB - 1965-06-18  Subjective:   Willie Walker is a 51 y.o. 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 He complained of ongoing pain around the incision site on his chest. He attributes some of his non-specific symptoms to domestic stress especially from his brother who currently lives with him and using illegal drugs and may be in "trouble" soon. Because of this stress, he has not been able to sleep well. He has no shortness of breath. Patient has No headache, No chest pain, No abdominal pain - No Nausea, No new weakness tingling or numbness, No Cough - SOB.  Problem  Adjustment Insomnia   ALLERGIES: Allergies  Allergen Reactions  . Crestor [Rosuvastatin] Other (See Comments)    "Makes my legs hurt" - tolerates simvastatin     PAST MEDICAL HISTORY: Past Medical History:  Diagnosis Date  . Anginal pain ([place])   . Anxiety   . Childhood asthma   . Chronic lower back pain   . Coronary artery disease   . ELECTROCARDIOGRAM, ABNORMAL   . GERD (gastroesophageal reflux disease)   . Gout   . Headache(784.0) 09/18/2011   "maybe twice/wk; pressure on the brain"  . High cholesterol   . Hypertension   . Kidney stones   . Lower GI bleed 09/18/2011   "clots and everything; not lately"  . Myocardial infarction 10/2005  . Peripheral vascular disease (HCC)    LLE  . Pneumonia 1990's  . Seizures ([place]) 09/18/2011   "blanks out on me"/wife's report  . Shortness of breath 09/18/11   "@ rest; lying down; w/exertion"  . Type I diabetes mellitus ([place])     MEDICATIONS AT HOME: Prior to Admission medications   Medication Sig Start Date End Date Taking? Authorizing Provider  allopurinol (ZYLOPRIM) 100 MG tablet Take 1 tablet (100 mg total) by mouth daily. Patient not taking: Reported on 12/05/2015 11/01/14    Tresa Garter, MD  ALPRAZolam Duanne Moron) 0.25 MG tablet Take 0.25 mg by mouth 2 (two) times daily as needed for anxiety.    Historical Provider, MD  aspirin 325 MG EC tablet Take 1 tablet (325 mg total) by mouth daily. 12/05/15   Tresa Garter, MD  colchicine 0.6 MG tablet Take 1 tablet (0.6 mg total) by mouth daily. 12/05/15   Tresa Garter, MD  fish oil-omega-3 fatty acids 1000 MG capsule Take 1 g by mouth 2 (two) times daily.    Historical Provider, MD  levothyroxine (SYNTHROID, LEVOTHROID) 50 MCG tablet Take 1 tablet (50 mcg total) by mouth daily before breakfast. X 3 WKS 04/22/16   Tresa Garter, MD  lisinopril (PRINIVIL,ZESTRIL) 10 MG tablet Take 1 tablet (10 mg total) by mouth daily. 04/22/16   Tresa Garter, MD  metFORMIN (GLUCOPHAGE) 500 MG tablet Take 1 tablet (500 mg total) by mouth 2 (two) times daily with a meal. 04/22/16   Tresa Garter, MD  metoprolol tartrate (LOPRESSOR) 25 MG tablet Take 0.5 tablets (12.5 mg total) by mouth 2 (two) times daily. 04/22/16   Tresa Garter, MD  Multiple Vitamins-Minerals (MULTIVITAMIN WITH MINERALS) tablet Take 1 tablet by mouth daily.    Historical Provider, MD  neomycin-polymyxin-hydrocortisone (CORTISPORIN) otic solution Place 3 drops into the left ear 4 (four) times daily. 02/19/16   Briona Korpela E  Doreene Burke, MD  ondansetron (ZOFRAN) 4 MG tablet Take 4 mg by mouth every 4 (four) hours as needed for nausea or vomiting.    Historical Provider, MD  ramelteon (ROZEREM) 8 MG tablet Take 1 tablet (8 mg total) by mouth at bedtime. 04/22/16   Tresa Garter, MD  traMADol (ULTRAM) 50 MG tablet Take 1 tablet (50 mg total) by mouth every 6 (six) hours as needed. 04/22/16   Tresa Garter, MD    Objective:  There were no vitals filed for this visit. Exam General appearance : Awake, alert, not in any distress. Speech Clear. Not toxic looking HEENT: Atraumatic and Normocephalic, pupils equally reactive to light and  accomodation Neck: Supple, no JVD. No cervical lymphadenopathy.  Chest: Good air entry bilaterally, no added sounds  CVS: S1 S2 regular, no murmurs.  Abdomen: Bowel sounds present, Non tender and not distended with no gaurding, rigidity or rebound. Extremities: B/L Lower Ext shows no edema, both legs are warm to touch Neurology: Awake alert, and oriented X 3, CN II-XII intact, Non focal Skin: No Rash  Data Review Lab Results  Component Value Date   HGBA1C 6.1 02/19/2016   HGBA1C 5.9 (H) 08/04/2015   HGBA1C 5.7 (H) 08/02/2015    Assessment & Plan   1. Type 2 diabetes mellitus without complication, without long-term current use of insulin (HCC)  - Glucose (CBG) - metFORMIN (GLUCOPHAGE) 500 MG tablet; Take 1 tablet (500 mg total) by mouth 2 (two) times daily with a meal.  Dispense: 180 tablet; Refill: 3  2. Post-op pain  - traMADol (ULTRAM) 50 MG tablet; Take 1 tablet (50 mg total) by mouth every 6 (six) hours as needed.  Dispense: 90 tablet; Refill: 0  3. Adjustment insomnia  - ramelteon (ROZEREM) 8 MG tablet; Take 1 tablet (8 mg total) by mouth at bedtime.  Dispense: 30 tablet; Refill: 0  4. Essential hypertension, benign  - metoprolol tartrate (LOPRESSOR) 25 MG tablet; Take 0.5 tablets (12.5 mg total) by mouth 2 (two) times daily.  Dispense: 180 tablet; Refill: 3 - lisinopril (PRINIVIL,ZESTRIL) 10 MG tablet; Take 1 tablet (10 mg total) by mouth daily.  Dispense: 90 tablet; Refill: 3 - CMP14+EGFR  5. Hypothyroidism, unspecified type  - levothyroxine (SYNTHROID, LEVOTHROID) 50 MCG tablet; Take 1 tablet (50 mcg total) by mouth daily before breakfast. X 3 WKS  Dispense: 45 tablet; Refill: 3 - TSH - T4, Free  Patient have been counseled extensively about nutrition and exercise. Other issues discussed during this visit include: low cholesterol diet, weight control and daily exercise, foot care, annual eye examinations at Ophthalmology, importance of adherence with medications  and regular follow-up. We also discussed long term complications of uncontrolled diabetes and hypertension.   Return in about 3 months (around 07/22/2016) for Hemoglobin A1C and Follow up, DM, Follow up Pain and comorbidities, Follow up HTN.  The patient was given clear instructions to go to ER or return to medical center if symptoms don't improve, worsen or new problems develop. The patient verbalized understanding. The patient was told to call to get lab results if they haven't heard anything in the next week.   This note has been created with Surveyor, quantity. Any transcriptional errors are unintentional.    Angelica Chessman, MD, [place], [facility]n [place], [place] [facility] [place], [place]   04/22/2016, 2:43 PM

## 2016-04-22 NOTE — Progress Notes (Signed)
Patient is here for FU  Patient complains of pain around the incision site.  Patient has eaten today. Patient has taken medication today.

## 2016-04-23 LAB — CMP14+EGFR
ALT: 17 IU/L (ref 0–44)
AST: 25 IU/L (ref 0–40)
Albumin/Globulin Ratio: 1.4 (ref 1.2–2.2)
Albumin: 4.3 g/dL (ref 3.5–5.5)
Alkaline Phosphatase: 93 IU/L (ref 39–117)
BUN/Creatinine Ratio: 16 (ref 9–20)
BUN: 23 mg/dL (ref 6–24)
Bilirubin Total: 0.3 mg/dL (ref 0.0–1.2)
CALCIUM: 9.9 mg/dL (ref 8.7–10.2)
CO2: 21 mmol/L (ref 18–29)
CREATININE: 1.42 mg/dL — AB (ref 0.76–1.27)
Chloride: 104 mmol/L (ref 96–106)
GFR, EST AFRICAN AMERICAN: 65 mL/min/{1.73_m2} (ref >59)
GFR, EST NON AFRICAN AMERICAN: 56 mL/min/{1.73_m2} — AB (ref >59)
GLOBULIN, TOTAL: 3.1 g/dL (ref 1.5–4.5)
Glucose: 109 mg/dL — ABNORMAL HIGH (ref 65–99)
Potassium: 4.5 mmol/L (ref 3.5–5.2)
SODIUM: 140 mmol/L (ref 134–144)
TOTAL PROTEIN: 7.4 g/dL (ref 6.0–8.5)

## 2016-04-23 LAB — TSH: TSH: 0.84 u[IU]/mL (ref 0.450–4.500)

## 2016-04-23 LAB — T4, FREE: FREE T4: 0.97 ng/dL (ref 0.82–1.77)

## 2016-04-24 ENCOUNTER — Telehealth: Payer: Self-pay | Admitting: *Deleted

## 2016-04-24 ENCOUNTER — Ambulatory Visit (HOSPITAL_COMMUNITY): Payer: Medicaid Other

## 2016-04-24 NOTE — Telephone Encounter (Signed)
Patient verified DOB Patient is aware of labs being mostly normal and kidney function being stable. Patient is aware of TSH level being within normal range but on the very low end. Patient advised to take all medications as prescribed. Patient had no further questions.

## 2016-04-24 NOTE — Telephone Encounter (Signed)
-----   Message from Quentin Angst, MD sent at 04/24/2016  3:55 PM EDT ----- Please inform the patient that his lab results are mostly normal, kidney function is stable.

## 2016-04-27 ENCOUNTER — Ambulatory Visit (HOSPITAL_COMMUNITY): Payer: Medicaid Other

## 2016-04-29 ENCOUNTER — Ambulatory Visit (HOSPITAL_COMMUNITY): Payer: Medicaid Other

## 2016-05-06 MED FILL — LISINOPRIL 10 MG TABLET: 10 | 30 days supply | Qty: 30 | Fill #3

## 2016-05-06 MED FILL — metFORMIN HCL 500 MG TABS: 500 | 30 days supply | Qty: 60 | Fill #3

## 2016-05-06 MED FILL — METOPROLOL TARTRATE 25 MG T: 25 | 30 days supply | Qty: 30 | Fill #3

## 2016-05-20 ENCOUNTER — Ambulatory Visit: Payer: Medicaid Other | Attending: Internal Medicine | Admitting: Internal Medicine

## 2016-05-20 ENCOUNTER — Encounter: Payer: Self-pay | Admitting: Internal Medicine

## 2016-05-20 VITALS — BP 104/64 | HR 56 | Temp 97.8°F | Resp 18 | Ht 71.0 in | Wt 159.0 lb

## 2016-05-20 DIAGNOSIS — F5102 Adjustment insomnia: Secondary | ICD-10-CM | POA: Diagnosis not present

## 2016-05-20 DIAGNOSIS — K219 Gastro-esophageal reflux disease without esophagitis: Secondary | ICD-10-CM | POA: Insufficient documentation

## 2016-05-20 DIAGNOSIS — Z79899 Other long term (current) drug therapy: Secondary | ICD-10-CM | POA: Insufficient documentation

## 2016-05-20 DIAGNOSIS — F419 Anxiety disorder, unspecified: Secondary | ICD-10-CM | POA: Diagnosis not present

## 2016-05-20 DIAGNOSIS — Z87442 Personal history of urinary calculi: Secondary | ICD-10-CM | POA: Diagnosis not present

## 2016-05-20 DIAGNOSIS — Z7982 Long term (current) use of aspirin: Secondary | ICD-10-CM | POA: Insufficient documentation

## 2016-05-20 DIAGNOSIS — Z951 Presence of aortocoronary bypass graft: Secondary | ICD-10-CM | POA: Diagnosis not present

## 2016-05-20 DIAGNOSIS — M109 Gout, unspecified: Secondary | ICD-10-CM | POA: Diagnosis not present

## 2016-05-20 DIAGNOSIS — Z888 Allergy status to other drugs, medicaments and biological substances status: Secondary | ICD-10-CM | POA: Diagnosis not present

## 2016-05-20 DIAGNOSIS — I10 Essential (primary) hypertension: Secondary | ICD-10-CM

## 2016-05-20 DIAGNOSIS — G5792 Unspecified mononeuropathy of left lower limb: Secondary | ICD-10-CM | POA: Insufficient documentation

## 2016-05-20 DIAGNOSIS — E7800 Pure hypercholesterolemia, unspecified: Secondary | ICD-10-CM | POA: Insufficient documentation

## 2016-05-20 DIAGNOSIS — M792 Neuralgia and neuritis, unspecified: Secondary | ICD-10-CM | POA: Insufficient documentation

## 2016-05-20 DIAGNOSIS — E119 Type 2 diabetes mellitus without complications: Secondary | ICD-10-CM | POA: Diagnosis present

## 2016-05-20 DIAGNOSIS — E1151 Type 2 diabetes mellitus with diabetic peripheral angiopathy without gangrene: Secondary | ICD-10-CM | POA: Insufficient documentation

## 2016-05-20 DIAGNOSIS — Z7984 Long term (current) use of oral hypoglycemic drugs: Secondary | ICD-10-CM | POA: Insufficient documentation

## 2016-05-20 DIAGNOSIS — I2510 Atherosclerotic heart disease of native coronary artery without angina pectoris: Secondary | ICD-10-CM | POA: Diagnosis not present

## 2016-05-20 DIAGNOSIS — I252 Old myocardial infarction: Secondary | ICD-10-CM | POA: Diagnosis not present

## 2016-05-20 LAB — GLUCOSE, POCT (MANUAL RESULT ENTRY): POC GLUCOSE: 123 mg/dL — AB (ref 70–99)

## 2016-05-20 LAB — POCT GLYCOSYLATED HEMOGLOBIN (HGB A1C): HEMOGLOBIN A1C: 6.1

## 2016-05-20 MED ORDER — METOPROLOL TARTRATE 25 MG PO TABS: 12.5000 mg | ORAL_TABLET | Freq: Two times a day (BID) | ORAL | 3 refills | Status: DC

## 2016-05-20 MED ORDER — GABAPENTIN 100 MG PO CAPS: 100.0000 mg | ORAL_CAPSULE | Freq: Three times a day (TID) | ORAL | 3 refills | Status: DC

## 2016-05-20 MED ORDER — RAMELTEON 8 MG PO TABS: 8.0000 mg | ORAL_TABLET | Freq: Every day | ORAL | 0 refills | Status: DC

## 2016-05-20 MED ORDER — ACETAMINOPHEN-CODEINE #3 300-30 MG PO TABS: 1.0000 | ORAL_TABLET | ORAL | 0 refills | Status: DC | PRN

## 2016-05-20 NOTE — Patient Instructions (Signed)
Diabetes and Foot Care Diabetes may cause you to have problems because of poor blood supply (circulation) to your feet and legs. This may cause the skin on your feet to become thinner, break easier, and heal more slowly. Your skin may become dry, and the skin may peel and crack. You may also have nerve damage in your legs and feet causing decreased feeling in them. You may not notice minor injuries to your feet that could lead to infections or more serious problems. Taking care of your feet is one of the most important things you can do for yourself. Follow these instructions at home:  Wear shoes at all times, even in the house. Do not go barefoot. Bare feet are easily injured.  Check your feet daily for blisters, cuts, and redness. If you cannot see the bottom of your feet, use a mirror or ask someone for help.  Wash your feet with warm water (do not use hot water) and mild soap. Then pat your feet and the areas between your toes until they are completely dry. Do not soak your feet as this can dry your skin.  Apply a moisturizing lotion or petroleum jelly (that does not contain alcohol and is unscented) to the skin on your feet and to dry, brittle toenails. Do not apply lotion between your toes.  Trim your toenails straight across. Do not dig under them or around the cuticle. File the edges of your nails with an emery board or nail file.  Do not cut corns or calluses or try to remove them with medicine.  Wear clean socks or stockings every day. Make sure they are not too tight. Do not wear knee-high stockings since they may decrease blood flow to your legs.  Wear shoes that fit properly and have enough cushioning. To break in new shoes, wear them for just a few hours a day. This prevents you from injuring your feet. Always look in your shoes before you put them on to be sure there are no objects inside.  Do not cross your legs. This may decrease the blood flow to your feet.  If you find a  minor scrape, cut, or break in the skin on your feet, keep it and the skin around it clean and dry. These areas may be cleansed with mild soap and water. Do not cleanse the area with peroxide, alcohol, or iodine.  When you remove an adhesive bandage, be sure not to damage the skin around it.  If you have a wound, look at it several times a day to make sure it is healing.  Do not use heating pads or hot water bottles. They may burn your skin. If you have lost feeling in your feet or legs, you may not know it is happening until it is too late.  Make sure your health care provider performs a complete foot exam at least annually or more often if you have foot problems. Report any cuts, sores, or bruises to your health care provider immediately. Contact a health care provider if:  You have an injury that is not healing.  You have cuts or breaks in the skin.  You have an ingrown nail.  You notice redness on your legs or feet.  You feel burning or tingling in your legs or feet.  You have pain or cramps in your legs and feet.  Your legs or feet are numb.  Your feet always feel cold. Get help right away if:  There is increasing   redness, swelling, or pain in or around a wound.  There is a red line that goes up your leg.  Pus is coming from a wound.  You develop a fever or as directed by your health care provider.  You notice a bad smell coming from an ulcer or wound. This information is not intended to replace advice given to you by your health care provider. Make sure you discuss any questions you have with your health care provider. Document Released: 01/03/2000 Document Revised: 06/13/2015 Document Reviewed: 06/14/2012 Elsevier Interactive Patient Education  2017 [employer] Blood Glucose Monitoring, Adult Monitoring your blood sugar (glucose) helps you manage your diabetes. It also helps you and your health care provider determine how well your diabetes management plan is  working. Blood glucose monitoring involves checking your blood glucose as often as directed, and keeping a record (log) of your results over time. Why should I monitor my blood glucose? Checking your blood glucose regularly can:  Help you understand how food, exercise, illnesses, and medicines affect your blood glucose.  Let you know what your blood glucose is at any time. You can quickly tell if you are having low blood glucose (hypoglycemia) or high blood glucose (hyperglycemia).  Help you and your health care provider adjust your medicines as needed. When should I check my blood glucose? Follow instructions from your health care provider about how often to check your blood glucose. This may depend on:  The type of diabetes you have.  How well-controlled your diabetes is.  Medicines you are taking. If you have type 1 diabetes:  Check your blood glucose at least 2 times a day.  Also check your blood glucose:  Before every insulin injection.  Before and after exercise.  Between meals.  2 hours after a meal.  Occasionally between 2:00 a.m. and 3:00 a.m., as directed.  Before potentially dangerous tasks, like driving or using heavy machinery.  At bedtime.  You may need to check your blood glucose more often, up to 6-10 times a day:  If you use an insulin pump.  If you need multiple daily injections (MDI).  If your diabetes is not well-controlled.  If you are ill.  If you have a history of severe hypoglycemia.  If you have a history of not knowing when your blood glucose is getting low (hypoglycemia unawareness). If you have type 2 diabetes:  If you take insulin or other diabetes medicines, check your blood glucose at least 2 times a day.  If you are on intensive insulin therapy, check your blood glucose at least 4 times a day. Occasionally, you may also need to check between 2:00 a.m. and 3:00 a.m., as directed.  Also check your blood glucose:  Before and after  exercise.  Before potentially dangerous tasks, like driving or using heavy machinery.  You may need to check your blood glucose more often if:  Your medicine is being adjusted.  Your diabetes is not well-controlled.  You are ill. What is a blood glucose log?  A blood glucose log is a record of your blood glucose readings. It helps you and your health care provider:  Look for patterns in your blood glucose over time.  Adjust your diabetes management plan as needed.  Every time you check your blood glucose, write down your result and notes about things that may be affecting your blood glucose, such as your diet and exercise for the day.  Most glucose meters store a record of glucose readings in   readings in the meter. Some meters allow you to download your records to a computer. How do I check my blood glucose? Follow these steps to get accurate readings of your blood glucose: Supplies needed    Blood glucose meter.  Test strips for your meter. Each meter has its own strips. You must use the strips that come with your meter.  A needle to prick your finger (lancet). Do not use lancets more than once.  A device that holds the lancet (lancing device).  A journal or log book to write down your results. Procedure   Wash your hands with soap and water.  Prick the side of your finger (not the tip) with the lancet. Use a different finger each time.  Gently rub the finger until a small drop of blood appears.  Follow instructions that come with your meter for inserting the test strip, applying blood to the strip, and using your blood glucose meter.  Write down your result and any notes. Alternative testing sites   Some meters allow you to use areas of your body other than your finger (alternative sites) to test your blood.  If you think you may have hypoglycemia, or if you have hypoglycemia unawareness, do not use alternative sites. Use your finger instead.  Alternative sites may  not be as accurate as the fingers, because blood flow is slower in these areas. This means that the result you get may be delayed, and it may be different from the result that you would get from your finger.  The most common alternative sites are:  Forearm.  Thigh.  Palm of the hand. Additional tips   Always keep your supplies with you.  If you have questions or need help, all blood glucose meters have a 24-hour "hotline" number that you can call. You may also contact your health care provider.  After you use a few boxes of test strips, adjust (calibrate) your blood glucose meter by following instructions that came with your meter. This information is not intended to replace advice given to you by your health care provider. Make sure you discuss any questions you have with your health care provider. Document Released: 01/08/2003 Document Revised: 07/26/2015 Document Reviewed: 06/17/2015 Elsevier Interactive Patient Education  2017 [employer] Hypertension Hypertension, commonly called high blood pressure, is when the force of blood pumping through the arteries is too strong. The arteries are the blood vessels that carry blood from the heart throughout the body. Hypertension forces the heart to work harder to pump blood and may cause arteries to become narrow or stiff. Having untreated or uncontrolled hypertension can cause heart attacks, strokes, kidney disease, and other problems. A blood pressure reading consists of a higher number over a lower number. Ideally, your blood pressure should be below 120/80. The first ("top") number is called the systolic pressure. It is a measure of the pressure in your arteries as your heart beats. The second ("bottom") number is called the diastolic pressure. It is a measure of the pressure in your arteries as the heart relaxes. What are the causes? The cause of this condition is not known. What increases the risk? Some risk factors for high blood  pressure are under your control. Others are not. Factors you can change   Smoking.  Having type 2 diabetes mellitus, high cholesterol, or both.  Not getting enough exercise or physical activity.  Being overweight.  Having too much fat, sugar, calories, or salt (sodium) in your diet.  Drinking too  much alcohol. Factors that are difficult or impossible to change   Having chronic kidney disease.  Having a family history of high blood pressure.  Age. Risk increases with age.  Race. You may be at higher risk if you are African-American.  Gender. Men are at higher risk than women before age 69. After age 60, women are at higher risk than men.  Having obstructive sleep apnea.  Stress. What are the signs or symptoms? Extremely high blood pressure (hypertensive crisis) may cause:  Headache.  Anxiety.  Shortness of breath.  Nosebleed.  Nausea and vomiting.  Severe chest pain.  Jerky movements you cannot control (seizures). How is this diagnosed? This condition is diagnosed by measuring your blood pressure while you are seated, with your arm resting on a surface. The cuff of the blood pressure monitor will be placed directly against the skin of your upper arm at the level of your heart. It should be measured at least twice using the same arm. Certain conditions can cause a difference in blood pressure between your right and left arms. Certain factors can cause blood pressure readings to be lower or higher than normal (elevated) for a short period of time:  When your blood pressure is higher when you are in a health care provider's office than when you are at home, this is called white coat hypertension. Most people with this condition do not need medicines.  When your blood pressure is higher at home than when you are in a health care provider's office, this is called masked hypertension. Most people with this condition may need medicines to control blood pressure. If you  have a high blood pressure reading during one visit or you have normal blood pressure with other risk factors:  You may be asked to return on a different day to have your blood pressure checked again.  You may be asked to monitor your blood pressure at home for 1 week or longer. If you are diagnosed with hypertension, you may have other blood or imaging tests to help your health care provider understand your overall risk for other conditions. How is this treated? This condition is treated by making healthy lifestyle changes, such as eating healthy foods, exercising more, and reducing your alcohol intake. Your health care provider may prescribe medicine if lifestyle changes are not enough to get your blood pressure under control, and if:  Your systolic blood pressure is above 130.  Your diastolic blood pressure is above 80. Your personal target blood pressure may vary depending on your medical conditions, your age, and other factors. Follow these instructions at home: Eating and drinking   Eat a diet that is high in fiber and potassium, and low in sodium, added sugar, and fat. An example eating plan is called the DASH (Dietary Approaches to Stop Hypertension) diet. To eat this way:  Eat plenty of fresh fruits and vegetables. Try to fill half of your plate at each meal with fruits and vegetables.  Eat whole grains, such as whole wheat pasta, brown rice, or whole grain bread. Fill about one quarter of your plate with whole grains.  Eat or drink low-fat dairy products, such as skim milk or low-fat yogurt.  Avoid fatty cuts of meat, processed or cured meats, and poultry with skin. Fill about one quarter of your plate with lean proteins, such as fish, chicken without skin, beans, eggs, and tofu.  Avoid premade and processed foods. These tend to be higher in sodium, added sugar, and  fat.  Reduce your daily sodium intake. Most people with hypertension should eat less than 1,500 mg of sodium a  day.  Limit alcohol intake to no more than 1 drink a day for nonpregnant women and 2 drinks a day for men. One drink equals 12 oz of beer, 5 oz of wine, or 1 oz of hard liquor. Lifestyle   Work with your health care provider to maintain a healthy body weight or to lose weight. Ask what an ideal weight is for you.  Get at least 30 minutes of exercise that causes your heart to beat faster (aerobic exercise) most days of the week. Activities may include walking, swimming, or biking.  Include exercise to strengthen your muscles (resistance exercise), such as pilates or lifting weights, as part of your weekly exercise routine. Try to do these types of exercises for 30 minutes at least 3 days a week.  Do not use any products that contain nicotine or tobacco, such as cigarettes and e-cigarettes. If you need help quitting, ask your health care provider.  Monitor your blood pressure at home as told by your health care provider.  Keep all follow-up visits as told by your health care provider. This is important. Medicines   Take over-the-counter and prescription medicines only as told by your health care provider. Follow directions carefully. Blood pressure medicines must be taken as prescribed.  Do not skip doses of blood pressure medicine. Doing this puts you at risk for problems and can make the medicine less effective.  Ask your health care provider about side effects or reactions to medicines that you should watch for. Contact a health care provider if:  You think you are having a reaction to a medicine you are taking.  You have headaches that keep coming back (recurring).  You feel dizzy.  You have swelling in your ankles.  You have trouble with your vision. Get help right away if:  You develop a severe headache or confusion.  You have unusual weakness or numbness.  You feel faint.  You have severe pain in your chest or abdomen.  You vomit repeatedly.  You have trouble  breathing. Summary  Hypertension is when the force of blood pumping through your arteries is too strong. If this condition is not controlled, it may put you at risk for serious complications.  Your personal target blood pressure may vary depending on your medical conditions, your age, and other factors. For most people, a normal blood pressure is less than 120/80.  Hypertension is treated with lifestyle changes, medicines, or a combination of both. Lifestyle changes include weight loss, eating a healthy, low-sodium diet, exercising more, and limiting alcohol. This information is not intended to replace advice given to you by your health care provider. Make sure you discuss any questions you have with your health care provider. Document Released: 01/05/2005 Document Revised: 12/04/2015 Document Reviewed: 12/04/2015 Elsevier Interactive Patient Education  2017 [employer].

## 2016-05-20 NOTE — Progress Notes (Signed)
Patient is here for DM and Pain  Patient complains of left foot pain radiating up the left side. Patient states pain is intermittent and

## 2016-05-20 NOTE — Progress Notes (Signed)
Willie Walker, is a 51 y.o. male  [id number]  [id number]  DOB - 1965/03/08  Chief Complaint  Patient presents with  . Diabetes      Subjective:   Willie Walker is a 51 y.o. male with a history of CAD s/p multiple PCIs and CABG x5V (07/2015), HTN, HLD, PVD here today for a follow up visit of DM. Patient is complaining of pain in his feet bilaterally, more on the left. No history of trauma. No swelling, no redness, he describes it as shooting and throbbing. He denies any suicidal ideation or thoughts. Blood sugar is controlled. He is still unable to sleep well from pain. He continues to smoke marijuana but no cigarette. He denies alcohol use. Patient has No headache, No chest pain, No abdominal pain - No Nausea, no Cough, no SOB.  Problem  Neuropathic Pain of Left Foot   ALLERGIES: Allergies  Allergen Reactions  . Crestor [Rosuvastatin] Other (See Comments)    "Makes my legs hurt" - tolerates simvastatin    PAST MEDICAL HISTORY: Past Medical History:  Diagnosis Date  . Anginal pain (HCC)   . Anxiety   . Childhood asthma   . Chronic lower back pain   . Coronary artery disease   . ELECTROCARDIOGRAM, ABNORMAL   . GERD (gastroesophageal reflux disease)   . Gout   . Headache(784.0) 09/18/2011   "maybe twice/wk; pressure on the brain"  . High cholesterol   . Hypertension   . Kidney stones   . Lower GI bleed 09/18/2011   "clots and everything; not lately"  . Myocardial infarction (HCC) 10/2005  . Peripheral vascular disease (HCC)    LLE  . Pneumonia 1990's  . Seizures (HCC) 09/18/2011   "blanks out on me"/wife's report  . Shortness of breath 09/18/11   "@ rest; lying down; w/exertion"  . Type I diabetes mellitus (HCC)     MEDICATIONS AT HOME: Prior to Admission medications   Medication Sig Start Date End Date Taking? Authorizing Provider  ALPRAZolam (XANAX) 0.25 MG tablet Take 0.25 mg by mouth 2 (two) times daily as needed for anxiety.   Yes Historical Provider, MD    aspirin 325 MG EC tablet Take 1 tablet (325 mg total) by mouth daily. 12/05/15  Yes Quentin Angst, MD  colchicine 0.6 MG tablet Take 1 tablet (0.6 mg total) by mouth daily. 12/05/15  Yes Quentin Angst, MD  fish oil-omega-3 fatty acids 1000 MG capsule Take 1 g by mouth 2 (two) times daily.   Yes Historical Provider, MD  levothyroxine (SYNTHROID, LEVOTHROID) 50 MCG tablet Take 1 tablet (50 mcg total) by mouth daily before breakfast. X 3 WKS 04/22/16  Yes Donald Jacque E Hyman Hopes, MD  lisinopril (PRINIVIL,ZESTRIL) 10 MG tablet Take 1 tablet (10 mg total) by mouth daily. 04/22/16  Yes Quentin Angst, MD  metFORMIN (GLUCOPHAGE) 500 MG tablet Take 1 tablet (500 mg total) by mouth 2 (two) times daily with a meal. 04/22/16  Yes Johanna Stafford E Hyman Hopes, MD  metoprolol tartrate (LOPRESSOR) 25 MG tablet Take 0.5 tablets (12.5 mg total) by mouth 2 (two) times daily. 05/20/16  Yes Quentin Angst, MD  neomycin-polymyxin-hydrocortisone (CORTISPORIN) otic solution Place 3 drops into the left ear 4 (four) times daily. 02/19/16  Yes Gabriella Guile Annitta Needs, MD  ramelteon (ROZEREM) 8 MG tablet Take 1 tablet (8 mg total) by mouth at bedtime. 05/20/16  Yes Quentin Angst, MD  acetaminophen-codeine (TYLENOL #3) 300-30 MG tablet Take 1 tablet by mouth every 4 (  four) hours as needed. 05/20/16   Quentin Angst, MD  allopurinol (ZYLOPRIM) 100 MG tablet Take 1 tablet (100 mg total) by mouth daily. Patient not taking: Reported on 12/05/2015 11/01/14   Quentin Angst, MD  gabapentin (NEURONTIN) 100 MG capsule Take 1 capsule (100 mg total) by mouth 3 (three) times daily. 05/20/16   Quentin Angst, MD    Objective:   Vitals:   05/20/16 0911  BP: 104/64  Pulse: (!) 56  Resp: 18  Temp: 97.8 F (36.6 C)  TempSrc: Oral  SpO2: 98%  Weight: 159 lb (72.1 kg)  Height:  (1.803 m)   Exam General appearance : Awake, alert, not in any distress. Speech Clear. Not toxic looking HEENT: Atraumatic and  Normocephalic, pupils equally reactive to light and accomodation Neck: Supple, no JVD. No cervical lymphadenopathy.  Chest: Good air entry bilaterally, no added sounds  CVS: S1 S2 regular, no murmurs.  Abdomen: Bowel sounds present, Non tender and not distended with no gaurding, rigidity or rebound. Extremities: B/L Lower Ext shows no edema, both legs are warm to touch Neurology: Awake alert, and oriented X 3, CN II-XII intact, Non focal Skin: No Rash  Data Review Lab Results  Component Value Date   HGBA1C 6.1 02/19/2016   HGBA1C 5.9 (H) 08/04/2015   HGBA1C 5.7 (H) 08/02/2015    Assessment & Plan   1. Type 2 diabetes mellitus without complication, without long-term current use of insulin (HCC)  - POCT A1C - Glucose (CBG)  - Ambulatory referral to Podiatry - Ambulatory referral to Ophthalmology  Start - gabapentin (NEURONTIN) 100 MG capsule; Take 1 capsule (100 mg total) by mouth 3 (three) times daily.  Dispense: 90 capsule; Refill: 3  2. Essential hypertension, benign  - metoprolol tartrate (LOPRESSOR) 25 MG tablet; Take 0.5 tablets (12.5 mg total) by mouth 2 (two) times daily.  Dispense: 180 tablet; Refill: 3  3. Neuropathic pain of left foot  - acetaminophen-codeine (TYLENOL #3) 300-30 MG tablet; Take 1 tablet by mouth every 4 (four) hours as needed.  Dispense: 60 tablet; Refill: 0 - gabapentin (NEURONTIN) 100 MG capsule; Take 1 capsule (100 mg total) by mouth 3 (three) times daily.  Dispense: 90 capsule; Refill: 3  4. Adjustment insomnia  - ramelteon (ROZEREM) 8 MG tablet; Take 1 tablet (8 mg total) by mouth at bedtime.  Dispense: 30 tablet; Refill: 0  Patient have been counseled extensively about nutrition and exercise. Other issues discussed during this visit include: low cholesterol diet, weight control and daily exercise, foot care, annual eye examinations at Ophthalmology, importance of adherence with medications and regular follow-up. We also discussed long term  complications of uncontrolled diabetes and hypertension.   Return in about 3 months (around 08/20/2016) for Hemoglobin A1C and Follow up, DM, Follow up Pain and comorbidities.  The patient was given clear instructions to go to ER or return to medical center if symptoms don't improve, worsen or new problems develop. The patient verbalized understanding. The patient was told to call to get lab results if they haven't heard anything in the next week.   This note has been created with Education officer, environmental. Any transcriptional errors are unintentional.    Jeanann Lewandowsky, MD, MHA, Maxwell Caul, CPE [facility] [place], [place] [phone]   05/20/2016, 9:46 AM

## 2016-06-01 LAB — HM DIABETES EYE EXAM

## 2016-06-04 MED FILL — METOPROLOL TARTRATE 25 MG T: 25 | 30 days supply | Qty: 30 | Fill #4

## 2016-06-04 MED FILL — metFORMIN HCL 500 MG TABS: 500 | 30 days supply | Qty: 60 | Fill #4

## 2016-06-04 MED FILL — LISINOPRIL 10 MG TABLET: 10 | 30 days supply | Qty: 30 | Fill #4

## 2016-07-07 MED FILL — metFORMIN HCL 500 MG TABS: 500 | 30 days supply | Qty: 60 | Fill #5

## 2016-07-07 MED FILL — METOPROLOL TARTRATE 25 MG T: 25 | 30 days supply | Qty: 30 | Fill #5

## 2016-07-07 MED FILL — LISINOPRIL 10 MG TABLET: 10 | 30 days supply | Qty: 30 | Fill #5

## 2016-07-14 ENCOUNTER — Encounter (INDEPENDENT_AMBULATORY_CARE_PROVIDER_SITE_OTHER): Payer: Medicaid Other | Admitting: Podiatry

## 2016-07-14 NOTE — Progress Notes (Signed)
This encounter was created in error - please disregard.

## 2016-08-05 ENCOUNTER — Ambulatory Visit: Payer: Medicaid Other | Admitting: Internal Medicine

## 2016-08-20 MED FILL — LISINOPRIL 10 MG TABLET: 10 | 30 days supply | Qty: 30 | Fill #6

## 2016-08-20 MED FILL — metFORMIN HCL 500 MG TABS: 500 | 30 days supply | Qty: 60 | Fill #6

## 2016-08-20 MED FILL — METOPROLOL TARTRATE 25 MG T: 25 | 30 days supply | Qty: 30 | Fill #6

## 2016-09-16 ENCOUNTER — Ambulatory Visit: Payer: Medicaid Other | Admitting: Internal Medicine

## 2016-09-30 MED FILL — LISINOPRIL 10 MG TABS: 10 | 30 days supply | Qty: 30 | Fill #7

## 2016-09-30 MED FILL — metFORMIN HCL 500 MG TABS: 500 | 30 days supply | Qty: 60 | Fill #7

## 2016-09-30 MED FILL — METOPROLOL TARTRATE 25 MG T: 25 | 30 days supply | Qty: 30 | Fill #7

## 2016-10-07 ENCOUNTER — Ambulatory Visit: Payer: Medicaid Other | Admitting: Internal Medicine

## 2016-10-21 ENCOUNTER — Encounter: Payer: Self-pay | Admitting: Internal Medicine

## 2016-10-21 ENCOUNTER — Ambulatory Visit: Payer: Medicaid Other | Attending: Internal Medicine | Admitting: Internal Medicine

## 2016-10-21 ENCOUNTER — Encounter: Payer: Self-pay | Admitting: Pharmacist

## 2016-10-21 ENCOUNTER — Other Ambulatory Visit: Payer: Self-pay | Admitting: Pharmacist

## 2016-10-21 VITALS — BP 123/78 | HR 99 | Temp 97.5°F | Resp 18 | Ht 71.0 in | Wt 167.8 lb

## 2016-10-21 DIAGNOSIS — E7800 Pure hypercholesterolemia, unspecified: Secondary | ICD-10-CM | POA: Diagnosis not present

## 2016-10-21 DIAGNOSIS — G629 Polyneuropathy, unspecified: Secondary | ICD-10-CM | POA: Diagnosis not present

## 2016-10-21 DIAGNOSIS — Z8701 Personal history of pneumonia (recurrent): Secondary | ICD-10-CM | POA: Insufficient documentation

## 2016-10-21 DIAGNOSIS — Z7982 Long term (current) use of aspirin: Secondary | ICD-10-CM | POA: Diagnosis not present

## 2016-10-21 DIAGNOSIS — F419 Anxiety disorder, unspecified: Secondary | ICD-10-CM | POA: Insufficient documentation

## 2016-10-21 DIAGNOSIS — Z951 Presence of aortocoronary bypass graft: Secondary | ICD-10-CM | POA: Diagnosis not present

## 2016-10-21 DIAGNOSIS — E1151 Type 2 diabetes mellitus with diabetic peripheral angiopathy without gangrene: Secondary | ICD-10-CM | POA: Insufficient documentation

## 2016-10-21 DIAGNOSIS — Z7984 Long term (current) use of oral hypoglycemic drugs: Secondary | ICD-10-CM | POA: Insufficient documentation

## 2016-10-21 DIAGNOSIS — I10 Essential (primary) hypertension: Secondary | ICD-10-CM

## 2016-10-21 DIAGNOSIS — Z79890 Hormone replacement therapy (postmenopausal): Secondary | ICD-10-CM | POA: Insufficient documentation

## 2016-10-21 DIAGNOSIS — M792 Neuralgia and neuritis, unspecified: Secondary | ICD-10-CM

## 2016-10-21 DIAGNOSIS — I252 Old myocardial infarction: Secondary | ICD-10-CM | POA: Diagnosis not present

## 2016-10-21 DIAGNOSIS — Z888 Allergy status to other drugs, medicaments and biological substances status: Secondary | ICD-10-CM | POA: Insufficient documentation

## 2016-10-21 DIAGNOSIS — E119 Type 2 diabetes mellitus without complications: Secondary | ICD-10-CM

## 2016-10-21 DIAGNOSIS — G5792 Unspecified mononeuropathy of left lower limb: Secondary | ICD-10-CM

## 2016-10-21 DIAGNOSIS — F5102 Adjustment insomnia: Secondary | ICD-10-CM

## 2016-10-21 DIAGNOSIS — M109 Gout, unspecified: Secondary | ICD-10-CM | POA: Insufficient documentation

## 2016-10-21 DIAGNOSIS — H538 Other visual disturbances: Secondary | ICD-10-CM

## 2016-10-21 DIAGNOSIS — Z79899 Other long term (current) drug therapy: Secondary | ICD-10-CM | POA: Insufficient documentation

## 2016-10-21 DIAGNOSIS — I2510 Atherosclerotic heart disease of native coronary artery without angina pectoris: Secondary | ICD-10-CM | POA: Insufficient documentation

## 2016-10-21 DIAGNOSIS — K219 Gastro-esophageal reflux disease without esophagitis: Secondary | ICD-10-CM | POA: Insufficient documentation

## 2016-10-21 DIAGNOSIS — R413 Other amnesia: Secondary | ICD-10-CM

## 2016-10-21 DIAGNOSIS — Z87442 Personal history of urinary calculi: Secondary | ICD-10-CM | POA: Diagnosis not present

## 2016-10-21 DIAGNOSIS — I300 Acute nonspecific idiopathic pericarditis: Secondary | ICD-10-CM | POA: Diagnosis not present

## 2016-10-21 LAB — POCT GLYCOSYLATED HEMOGLOBIN (HGB A1C): HEMOGLOBIN A1C: 6.2

## 2016-10-21 MED ORDER — RAMELTEON 8 MG PO TABS: 8.0000 mg | ORAL_TABLET | Freq: Every day | ORAL | 0 refills | Status: DC

## 2016-10-21 MED ORDER — COLCHICINE 0.6 MG PO TABS: 0.6000 mg | ORAL_TABLET | Freq: Every day | ORAL | 0 refills | Status: DC

## 2016-10-21 MED ORDER — GABAPENTIN 100 MG PO CAPS: 100.0000 mg | ORAL_CAPSULE | Freq: Three times a day (TID) | ORAL | 3 refills | Status: DC

## 2016-10-21 MED ORDER — LISINOPRIL 10 MG PO TABS: 10.0000 mg | ORAL_TABLET | Freq: Every day | ORAL | 3 refills | Status: DC

## 2016-10-21 MED ORDER — METOPROLOL TARTRATE 25 MG PO TABS: 12.5000 mg | ORAL_TABLET | Freq: Two times a day (BID) | ORAL | 3 refills | Status: DC

## 2016-10-21 MED ORDER — METFORMIN HCL 500 MG PO TABS: 500.0000 mg | ORAL_TABLET | Freq: Two times a day (BID) | ORAL | 3 refills | Status: DC

## 2016-10-21 MED ORDER — COLCHICINE 0.6 MG PO CAPS: 1.0000 | ORAL_CAPSULE | Freq: Every day | ORAL | 0 refills | Status: DC

## 2016-10-21 NOTE — Progress Notes (Signed)
Prior authorization request received from [facility] [employer] for Rozerem. Completed and approved [id number]

## 2016-10-21 NOTE — Progress Notes (Signed)
Willie Walker, is a 51 y.o. male  ZOX:[id number]  [id number]  DOB - [birth year]  Chief Complaint  Patient presents with  . Follow-up      Subjective:   Willie Walker is a 51 y.o. male with a history of CAD s/p multiple PCIs and CABG x5V (07/2015), HTN, HLD, PVD here today for a follow up visit, evaluation for chest pain and dizziness as well as medication refill. Patient claims he has been having chest pain especially when he is lying down, he has to stoop forward to get relief. He also complain of memory loss, he finds it difficult to remember short term memories, especially where he kept keys or other items. He is still unable to sleep well from pain. He continues to smoke marijuana but no cigarette. He denies alcohol use. Patient has No abdominal pain - No Nausea, No new weakness tingling or numbness, No Cough - SOB.He request referral to ophthalmologist for blurry vision bilaterally but no double vision.  Problem  Blurry Vision, Bilateral  Memory Loss   ALLERGIES: Allergies  Allergen Reactions  . Crestor [Rosuvastatin] Other (See Comments)    "Makes my legs hurt" - tolerates simvastatin    PAST MEDICAL HISTORY: Past Medical History:  Diagnosis Date  . Anginal pain (HCC)   . Anxiety   . Childhood asthma   . Chronic lower back pain   . Coronary artery disease   . ELECTROCARDIOGRAM, ABNORMAL   . GERD (gastroesophageal reflux disease)   . Gout   . Headache(784.0) 09/18/2011   "maybe twice/wk; pressure on the brain"  . High cholesterol   . Hypertension   . Kidney stones   . Lower GI bleed 09/18/2011   "clots and everything; not lately"  . Myocardial infarction (HCC) 10/2005  . Peripheral vascular disease (HCC)    LLE  . Pneumonia 1990's  . Seizures (HCC) 09/18/2011   "blanks out on me"/wife's report  . Shortness of breath 09/18/11   "@ rest; lying down; w/exertion"  . Type I diabetes mellitus (HCC)    MEDICATIONS AT HOME: Prior to Admission medications     Medication Sig Start Date End Date Taking? Authorizing Provider  acetaminophen-codeine (TYLENOL #3) 300-30 MG tablet Take 1 tablet by mouth every 4 (four) hours as needed. 05/20/16   Quentin Angst, MD  allopurinol (ZYLOPRIM) 100 MG tablet Take 1 tablet (100 mg total) by mouth daily. Patient not taking: Reported on 12/05/2015 11/01/14   Quentin Angst, MD  ALPRAZolam Prudy Feeler) 0.25 MG tablet Take 0.25 mg by mouth 2 (two) times daily as needed for anxiety.    [provider]  aspirin 325 MG EC tablet Take 1 tablet (325 mg total) by mouth daily. 12/05/15   Quentin Angst, MD  colchicine 0.6 MG tablet Take 1 tablet (0.6 mg total) by mouth daily. 10/21/16   Quentin Angst, MD  fish oil-omega-3 fatty acids 1000 MG capsule Take 1 g by mouth 2 (two) times daily.    [provider]  gabapentin (NEURONTIN) 100 MG capsule Take 1 capsule (100 mg total) by mouth 3 (three) times daily. 10/21/16   Quentin Angst, MD  levothyroxine (SYNTHROID, LEVOTHROID) 50 MCG tablet Take 1 tablet (50 mcg total) by mouth daily before breakfast. X 3 WKS 04/22/16   Quentin Angst, MD  lisinopril (PRINIVIL,ZESTRIL) 10 MG tablet Take 1 tablet (10 mg total) by mouth daily. 10/21/16   Quentin Angst, MD  metFORMIN (GLUCOPHAGE) 500 MG tablet Take 1  tablet (500 mg total) by mouth 2 (two) times daily with a meal. 10/21/16   Ziah Turvey E, MD  metoprolol tartrate (LOPRESSOR) 25 MG tablet Take 0.5 tablets (12.5 mg total) by mouth 2 (two) times daily. 10/21/16   Quentin Angst, MD  neomycin-polymyxin-hydrocortisone (CORTISPORIN) otic solution Place 3 drops into the left ear 4 (four) times daily. 02/19/16   Quentin Angst, MD  ramelteon (ROZEREM) 8 MG tablet Take 1 tablet (8 mg total) by mouth at bedtime. 10/21/16   Quentin Angst, MD   Objective:   Vitals:   10/21/16 1123  BP: 123/78  Pulse: 99  Resp: 18  Temp: (!) 97.5 F (36.4 C)  TempSrc: Oral  SpO2: 99%   Weight: 167 lb 12.8 oz (76.1 kg)  Height:  (1.803 m)   Exam General appearance : Awake, alert, not in any distress. Speech Clear. Not toxic looking HEENT: Atraumatic and Normocephalic, pupils equally reactive to light and accomodation Neck: Supple, no JVD. No cervical lymphadenopathy.  Chest: Good air entry bilaterally, no added sounds  CVS: S1 S2 regular, no murmurs.  Abdomen: Bowel sounds present, Non tender and not distended with no gaurding, rigidity or rebound. Extremities: B/L Lower Ext shows no edema, both legs are warm to touch Neurology: Awake alert, and oriented X 3, CN II-XII intact, Non focal Skin: No Rash  Data Review Lab Results  Component Value Date   HGBA1C 6.2 10/21/2016   HGBA1C 6.1 05/20/2016   HGBA1C 6.1 02/19/2016    Assessment & Plan   1. Type 2 diabetes mellitus without complication, without long-term current use of insulin (HCC)  - POCT A1C - metFORMIN (GLUCOPHAGE) 500 MG tablet; Take 1 tablet (500 mg total) by mouth 2 (two) times daily with a meal.  Dispense: 180 tablet; Refill: 3 - gabapentin (NEURONTIN) 100 MG capsule; Take 1 capsule (100 mg total) by mouth 3 (three) times daily.  Dispense: 90 capsule; Refill: 3  2. Essential hypertension, benign  - lisinopril (PRINIVIL,ZESTRIL) 10 MG tablet; Take 1 tablet (10 mg total) by mouth daily.  Dispense: 90 tablet; Refill: 3 - metoprolol tartrate (LOPRESSOR) 25 MG tablet; Take 0.5 tablets (12.5 mg total) by mouth 2 (two) times daily.  Dispense: 180 tablet; Refill: 3  3. S/P CABG x 5  - ECHOCARDIOGRAM COMPLETE; Future - US Carotid Duplex Bilateral; Future  4. Memory loss  - US Carotid Duplex Bilateral; Future - Ambulatory referral to Neurology  5. Blurry vision, bilateral  - Ambulatory referral to Ophthalmology  6. Adjustment insomnia  - ramelteon (ROZEREM) 8 MG tablet; Take 1 tablet (8 mg total) by mouth at bedtime.  Dispense: 30 tablet; Refill: 0  7. Neuropathic pain of left foot  -  gabapentin (NEURONTIN) 100 MG capsule; Take 1 capsule (100 mg total) by mouth 3 (three) times daily.  Dispense: 90 capsule; Refill: 3  8. Acute idiopathic pericarditis  - colchicine 0.6 MG tablet; Take 1 tablet (0.6 mg total) by mouth daily.  Dispense: 14 tablet; Refill: 0  Patient have been counseled extensively about nutrition and exercise. Other issues discussed during this visit include: low cholesterol diet, weight control and daily exercise, foot care, annual eye examinations at Ophthalmology, importance of adherence with medications and regular follow-up. We also discussed long term complications of uncontrolled diabetes and hypertension.   Return in about 4 weeks (around 11/18/2016) for Heart Failure and Hypertension.  The patient was given clear instructions to go to ER or return to medical center if symptoms  don't improve, worsen or new problems develop. The patient verbalized understanding. The patient was told to call to get lab results if they haven't heard anything in the next week.   This note has been created with Education officer, environmental. Any transcriptional errors are unintentional.    Jeanann Lewandowsky, MD, MHA, FACP, FAAP, CPE [facility] [place], [place] [phone]   10/21/2016, 11:36 AM

## 2016-10-21 NOTE — Patient Instructions (Signed)
Diabetes Mellitus and Exercise Exercising regularly is important for your overall health, especially when you have diabetes (diabetes mellitus). Exercising is not only about losing weight. It has many health benefits, such as increasing muscle strength and bone density and reducing body fat and stress. This leads to improved fitness, flexibility, and endurance, all of which result in better overall health. Exercise has additional benefits for people with diabetes, including:  Reducing appetite.  Helping to lower and control blood glucose.  Lowering blood pressure.  Helping to control amounts of fatty substances (lipids) in the blood, such as cholesterol and triglycerides.  Helping the body to respond better to insulin (improving insulin sensitivity).  Reducing how much insulin the body needs.  Decreasing the risk for heart disease by: ? Lowering cholesterol and triglyceride levels. ? Increasing the levels of good cholesterol. ? Lowering blood glucose levels.  What is my activity plan? Your health care provider or certified diabetes educator can help you make a plan for the type and frequency of exercise (activity plan) that works for you. Make sure that you:  Do at least 150 minutes of moderate-intensity or vigorous-intensity exercise each week. This could be brisk walking, biking, or water aerobics. ? Do stretching and strength exercises, such as yoga or weightlifting, at least 2 times a week. ? Spread out your activity over at least 3 days of the week.  Get some form of physical activity every day. ? Do not go more than 2 days in a row without some kind of physical activity. ? Avoid being inactive for more than 90 minutes at a time. Take frequent breaks to walk or stretch.  Choose a type of exercise or activity that you enjoy, and set realistic goals.  Start slowly, and gradually increase the intensity of your exercise over time.  What do I need to know about managing my  diabetes?  Check your blood glucose before and after exercising. ? If your blood glucose is higher than 240 mg/dL (13.3 mmol/L) before you exercise, check your urine for ketones. If you have ketones in your urine, do not exercise until your blood glucose returns to normal.  Know the symptoms of low blood glucose (hypoglycemia) and how to treat it. Your risk for hypoglycemia increases during and after exercise. Common symptoms of hypoglycemia can include: ? Hunger. ? Anxiety. ? Sweating and feeling clammy. ? Confusion. ? Dizziness or feeling light-headed. ? Increased heart rate or palpitations. ? Blurry vision. ? Tingling or numbness around the mouth, lips, or tongue. ? Tremors or shakes. ? Irritability.  Keep a rapid-acting carbohydrate snack available before, during, and after exercise to help prevent or treat hypoglycemia.  Avoid injecting insulin into areas of the body that are going to be exercised. For example, avoid injecting insulin into: ? The arms, when playing tennis. ? The legs, when jogging.  Keep records of your exercise habits. Doing this can help you and your health care provider adjust your diabetes management plan as needed. Write down: ? Food that you eat before and after you exercise. ? Blood glucose levels before and after you exercise. ? The type and amount of exercise you have done. ? When your insulin is expected to peak, if you use insulin. Avoid exercising at times when your insulin is peaking.  When you start a new exercise or activity, work with your health care provider to make sure the activity is safe for you, and to adjust your insulin, medicines, or food intake as needed.    Drink plenty of water while you exercise to prevent dehydration or heat stroke. Drink enough fluid to keep your urine clear or pale yellow. This information is not intended to replace advice given to you by your health care provider. Make sure you discuss any questions you have with  your health care provider. Document Released: 03/28/2003 Document Revised: 07/26/2015 Document Reviewed: 06/17/2015 Elsevier Interactive Patient Education  2018 [employer] Blood Glucose Monitoring, Adult Monitoring your blood sugar (glucose) helps you manage your diabetes. It also helps you and your health care provider determine how well your diabetes management plan is working. Blood glucose monitoring involves checking your blood glucose as often as directed, and keeping a record (log) of your results over time. Why should I monitor my blood glucose? Checking your blood glucose regularly can:  Help you understand how food, exercise, illnesses, and medicines affect your blood glucose.  Let you know what your blood glucose is at any time. You can quickly tell if you are having low blood glucose (hypoglycemia) or high blood glucose (hyperglycemia).  Help you and your health care provider adjust your medicines as needed.  When should I check my blood glucose? Follow instructions from your health care provider about how often to check your blood glucose. This may depend on:  The type of diabetes you have.  How well-controlled your diabetes is.  Medicines you are taking.  If you have type 1 diabetes:  Check your blood glucose at least 2 times a day.  Also check your blood glucose: ? Before every insulin injection. ? Before and after exercise. ? Between meals. ? 2 hours after a meal. ? Occasionally between 2:00 a.m. and 3:00 a.m., as directed. ? Before potentially dangerous tasks, like driving or using heavy machinery. ? At bedtime.  You may need to check your blood glucose more often, up to 6-10 times a day: ? If you use an insulin pump. ? If you need multiple daily injections (MDI). ? If your diabetes is not well-controlled. ? If you are ill. ? If you have a history of severe hypoglycemia. ? If you have a history of not knowing when your blood glucose is getting low  (hypoglycemia unawareness). If you have type 2 diabetes:  If you take insulin or other diabetes medicines, check your blood glucose at least 2 times a day.  If you are on intensive insulin therapy, check your blood glucose at least 4 times a day. Occasionally, you may also need to check between 2:00 a.m. and 3:00 a.m., as directed.  Also check your blood glucose: ? Before and after exercise. ? Before potentially dangerous tasks, like driving or using heavy machinery.  You may need to check your blood glucose more often if: ? Your medicine is being adjusted. ? Your diabetes is not well-controlled. ? You are ill. What is a blood glucose log?  A blood glucose log is a record of your blood glucose readings. It helps you and your health care provider: ? Look for patterns in your blood glucose over time. ? Adjust your diabetes management plan as needed.  Every time you check your blood glucose, write down your result and notes about things that may be affecting your blood glucose, such as your diet and exercise for the day.  Most glucose meters store a record of glucose readings in the meter. Some meters allow you to download your records to a computer. How do I check my blood glucose? Follow these steps to get accurate   readings of your blood glucose: Supplies needed   Blood glucose meter.  Test strips for your meter. Each meter has its own strips. You must use the strips that come with your meter.  A needle to prick your finger (lancet). Do not use lancets more than once.  A device that holds the lancet (lancing device).  A journal or log book to write down your results. Procedure  Wash your hands with soap and water.  Prick the side of your finger (not the tip) with the lancet. Use a different finger each time.  Gently rub the finger until a small drop of blood appears.  Follow instructions that come with your meter for inserting the test strip, applying blood to the strip,  and using your blood glucose meter.  Write down your result and any notes. Alternative testing sites  Some meters allow you to use areas of your body other than your finger (alternative sites) to test your blood.  If you think you may have hypoglycemia, or if you have hypoglycemia unawareness, do not use alternative sites. Use your finger instead.  Alternative sites may not be as accurate as the fingers, because blood flow is slower in these areas. This means that the result you get may be delayed, and it may be different from the result that you would get from your finger.  The most common alternative sites are: ? Forearm. ? Thigh. ? Palm of the hand. Additional tips  Always keep your supplies with you.  If you have questions or need help, all blood glucose meters have a 24-hour "hotline" number that you can call. You may also contact your health care provider.  After you use a few boxes of test strips, adjust (calibrate) your blood glucose meter by following instructions that came with your meter. This information is not intended to replace advice given to you by your health care provider. Make sure you discuss any questions you have with your health care provider. Document Released: 01/08/2003 Document Revised: 07/26/2015 Document Reviewed: 06/17/2015 Elsevier Interactive Patient Education  2017 [employer] DASH Eating Plan DASH stands for "Dietary Approaches to Stop Hypertension." The DASH eating plan is a healthy eating plan that has been shown to reduce high blood pressure (hypertension). It may also reduce your risk for type 2 diabetes, heart disease, and stroke. The DASH eating plan may also help with weight loss. What are tips for following this plan? General guidelines  Avoid eating more than 2,300 mg (milligrams) of salt (sodium) a day. If you have hypertension, you may need to reduce your sodium intake to 1,500 mg a day.  Limit alcohol intake to no more than 1 drink a  day for nonpregnant women and 2 drinks a day for men. One drink equals 12 oz of beer, 5 oz of wine, or 1 oz of hard liquor.  Work with your health care provider to maintain a healthy body weight or to lose weight. Ask what an ideal weight is for you.  Get at least 30 minutes of exercise that causes your heart to beat faster (aerobic exercise) most days of the week. Activities may include walking, swimming, or biking.  Work with your health care provider or diet and nutrition specialist (dietitian) to adjust your eating plan to your individual calorie needs. Reading food labels  Check food labels for the amount of sodium per serving. Choose foods with less than 5 percent of the Daily Value of sodium. Generally, foods with less than 300  mg of sodium per serving fit into this eating plan.  To find whole grains, look for the word "whole" as the first word in the ingredient list. Shopping  Buy products labeled as "low-sodium" or "no salt added."  Buy fresh foods. Avoid canned foods and premade or frozen meals. Cooking  Avoid adding salt when cooking. Use salt-free seasonings or herbs instead of table salt or sea salt. Check with your health care provider or pharmacist before using salt substitutes.  Do not fry foods. Cook foods using healthy methods such as baking, boiling, grilling, and broiling instead.  Cook with heart-healthy oils, such as olive, canola, soybean, or sunflower oil. Meal planning   Eat a balanced diet that includes: ? 5 or more servings of fruits and vegetables each day. At each meal, try to fill half of your plate with fruits and vegetables. ? Up to 6-8 servings of whole grains each day. ? Less than 6 oz of lean meat, poultry, or fish each day. A 3-oz serving of meat is about the same size as a deck of cards. One egg equals 1 oz. ? 2 servings of low-fat dairy each day. ? A serving of nuts, seeds, or beans 5 times each week. ? Heart-healthy fats. Healthy fats called  Omega-3 fatty acids are found in foods such as flaxseeds and coldwater fish, like sardines, salmon, and mackerel.  Limit how much you eat of the following: ? Canned or prepackaged foods. ? Food that is high in trans fat, such as fried foods. ? Food that is high in saturated fat, such as fatty meat. ? Sweets, desserts, sugary drinks, and other foods with added sugar. ? Full-fat dairy products.  Do not salt foods before eating.  Try to eat at least 2 vegetarian meals each week.  Eat more home-cooked food and less restaurant, buffet, and fast food.  When eating at a restaurant, ask that your food be prepared with less salt or no salt, if possible. What foods are recommended? The items listed may not be a complete list. Talk with your dietitian about what dietary choices are best for you. Grains Whole-grain or whole-wheat bread. Whole-grain or whole-wheat pasta. Brown rice. Orpah Cobb. Bulgur. Whole-grain and low-sodium cereals. Pita bread. Low-fat, low-sodium crackers. Whole-wheat flour tortillas. Vegetables Fresh or frozen vegetables (raw, steamed, roasted, or grilled). Low-sodium or reduced-sodium tomato and vegetable juice. Low-sodium or reduced-sodium tomato sauce and tomato paste. Low-sodium or reduced-sodium canned vegetables. Fruits All fresh, dried, or frozen fruit. Canned fruit in natural juice (without added sugar). Meat and other protein foods Skinless chicken or [place]. Ground chicken or [place]. Pork with fat trimmed off. Fish and seafood. Egg whites. Dried beans, peas, or lentils. Unsalted nuts, nut butters, and seeds. Unsalted canned beans. Lean cuts of beef with fat trimmed off. Low-sodium, lean deli meat. Dairy Low-fat (1%) or fat-free (skim) milk. Fat-free, low-fat, or reduced-fat cheeses. Nonfat, low-sodium ricotta or cottage cheese. Low-fat or nonfat yogurt. Low-fat, low-sodium cheese. Fats and oils Soft margarine without trans fats. Vegetable oil. Low-fat,  reduced-fat, or light mayonnaise and salad dressings (reduced-sodium). Canola, safflower, olive, soybean, and sunflower oils. Avocado. Seasoning and other foods Herbs. Spices. Seasoning mixes without salt. Unsalted popcorn and pretzels. Fat-free sweets. What foods are not recommended? The items listed may not be a complete list. Talk with your dietitian about what dietary choices are best for you. Grains Baked goods made with fat, such as croissants, muffins, or some breads. Dry pasta or rice meal packs. Vegetables Creamed  or fried vegetables. Vegetables in a cheese sauce. Regular canned vegetables (not low-sodium or reduced-sodium). Regular canned tomato sauce and paste (not low-sodium or reduced-sodium). Regular tomato and vegetable juice (not low-sodium or reduced-sodium). Rosita Fire. Olives. Fruits Canned fruit in a light or heavy syrup. Fried fruit. Fruit in cream or butter sauce. Meat and other protein foods Fatty cuts of meat. Ribs. Fried meat. Tomasa Blase. Sausage. Bologna and other processed lunch meats. Salami. Fatback. Hotdogs. Bratwurst. Salted nuts and seeds. Canned beans with added salt. Canned or smoked fish. Whole eggs or egg yolks. Chicken or [place] with skin. Dairy Whole or 2% milk, cream, and half-and-half. Whole or full-fat cream cheese. Whole-fat or sweetened yogurt. Full-fat cheese. Nondairy creamers. Whipped toppings. Processed cheese and cheese spreads. Fats and oils Butter. Stick margarine. Lard. Shortening. Ghee. Bacon fat. Tropical oils, such as coconut, palm kernel, or palm oil. Seasoning and other foods Salted popcorn and pretzels. Onion salt, garlic salt, seasoned salt, table salt, and sea salt. Worcestershire sauce. Tartar sauce. Barbecue sauce. Teriyaki sauce. Soy sauce, including reduced-sodium. Steak sauce. Canned and packaged gravies. Fish sauce. Oyster sauce. Cocktail sauce. Horseradish that you find on the shelf. Ketchup. Mustard. Meat flavorings and tenderizers.  Bouillon cubes. Hot sauce and Tabasco sauce. Premade or packaged marinades. Premade or packaged taco seasonings. Relishes. Regular salad dressings. Where to find more information:  National Heart, Lung, and Blood Institute: PopSteam.is  American Heart Association: www.heart.org Summary  The DASH eating plan is a healthy eating plan that has been shown to reduce high blood pressure (hypertension). It may also reduce your risk for type 2 diabetes, heart disease, and stroke.  With the DASH eating plan, you should limit salt (sodium) intake to 2,300 mg a day. If you have hypertension, you may need to reduce your sodium intake to 1,500 mg a day.  When on the DASH eating plan, aim to eat more fresh fruits and vegetables, whole grains, lean proteins, low-fat dairy, and heart-healthy fats.  Work with your health care provider or diet and nutrition specialist (dietitian) to adjust your eating plan to your individual calorie needs. This information is not intended to replace advice given to you by your health care provider. Make sure you discuss any questions you have with your health care provider. Document Released: 12/25/2010 Document Revised: 12/30/2015 Document Reviewed: 12/30/2015 Elsevier Interactive Patient Education  2017 [employer] Inc. Hypertension Hypertension, commonly called high blood pressure, is when the force of blood pumping through the arteries is too strong. The arteries are the blood vessels that carry blood from the heart throughout the body. Hypertension forces the heart to work harder to pump blood and may cause arteries to become narrow or stiff. Having untreated or uncontrolled hypertension can cause heart attacks, strokes, kidney disease, and other problems. A blood pressure reading consists of a higher number over a lower number. Ideally, your blood pressure should be below 120/80. The first ("top") number is called the systolic pressure. It is a measure of the pressure in  your arteries as your heart beats. The second ("bottom") number is called the diastolic pressure. It is a measure of the pressure in your arteries as the heart relaxes. What are the causes? The cause of this condition is not known. What increases the risk? Some risk factors for high blood pressure are under your control. Others are not. Factors you can change  Smoking.  Having type 2 diabetes mellitus, high cholesterol, or both.  Not getting enough exercise or physical activity.  Being overweight.  Having too much fat, sugar, calories, or salt (sodium) in your diet.  Drinking too much alcohol. Factors that are difficult or impossible to change  Having chronic kidney disease.  Having a family history of high blood pressure.  Age. Risk increases with age.  Race. You may be at higher risk if you are African-American.  Gender. Men are at higher risk than women before age 47. After age 25, women are at higher risk than men.  Having obstructive sleep apnea.  Stress. What are the signs or symptoms? Extremely high blood pressure (hypertensive crisis) may cause:  Headache.  Anxiety.  Shortness of breath.  Nosebleed.  Nausea and vomiting.  Severe chest pain.  Jerky movements you cannot control (seizures).  How is this diagnosed? This condition is diagnosed by measuring your blood pressure while you are seated, with your arm resting on a surface. The cuff of the blood pressure monitor will be placed directly against the skin of your upper arm at the level of your heart. It should be measured at least twice using the same arm. Certain conditions can cause a difference in blood pressure between your right and left arms. Certain factors can cause blood pressure readings to be lower or higher than normal (elevated) for a short period of time:  When your blood pressure is higher when you are in a health care provider's office than when you are at home, this is called white coat  hypertension. Most people with this condition do not need medicines.  When your blood pressure is higher at home than when you are in a health care provider's office, this is called masked hypertension. Most people with this condition may need medicines to control blood pressure.  If you have a high blood pressure reading during one visit or you have normal blood pressure with other risk factors:  You may be asked to return on a different day to have your blood pressure checked again.  You may be asked to monitor your blood pressure at home for 1 week or longer.  If you are diagnosed with hypertension, you may have other blood or imaging tests to help your health care provider understand your overall risk for other conditions. How is this treated? This condition is treated by making healthy lifestyle changes, such as eating healthy foods, exercising more, and reducing your alcohol intake. Your health care provider may prescribe medicine if lifestyle changes are not enough to get your blood pressure under control, and if:  Your systolic blood pressure is above 130.  Your diastolic blood pressure is above 80.  Your personal target blood pressure may vary depending on your medical conditions, your age, and other factors. Follow these instructions at home: Eating and drinking  Eat a diet that is high in fiber and potassium, and low in sodium, added sugar, and fat. An example eating plan is called the DASH (Dietary Approaches to Stop Hypertension) diet. To eat this way: ? Eat plenty of fresh fruits and vegetables. Try to fill half of your plate at each meal with fruits and vegetables. ? Eat whole grains, such as whole wheat pasta, brown rice, or whole grain bread. Fill about one quarter of your plate with whole grains. ? Eat or drink low-fat dairy products, such as skim milk or low-fat yogurt. ? Avoid fatty cuts of meat, processed or cured meats, and poultry with skin. Fill about one quarter of  your plate with lean proteins, such as fish, chicken without skin, beans, eggs,  and tofu. ? Avoid premade and processed foods. These tend to be higher in sodium, added sugar, and fat.  Reduce your daily sodium intake. Most people with hypertension should eat less than 1,500 mg of sodium a day.  Limit alcohol intake to no more than 1 drink a day for nonpregnant women and 2 drinks a day for men. One drink equals 12 oz of beer, 5 oz of wine, or 1 oz of hard liquor. Lifestyle  Work with your health care provider to maintain a healthy body weight or to lose weight. Ask what an ideal weight is for you.  Get at least 30 minutes of exercise that causes your heart to beat faster (aerobic exercise) most days of the week. Activities may include walking, swimming, or biking.  Include exercise to strengthen your muscles (resistance exercise), such as pilates or lifting weights, as part of your weekly exercise routine. Try to do these types of exercises for 30 minutes at least 3 days a week.  Do not use any products that contain nicotine or tobacco, such as cigarettes and e-cigarettes. If you need help quitting, ask your health care provider.  Monitor your blood pressure at home as told by your health care provider.  Keep all follow-up visits as told by your health care provider. This is important. Medicines  Take over-the-counter and prescription medicines only as told by your health care provider. Follow directions carefully. Blood pressure medicines must be taken as prescribed.  Do not skip doses of blood pressure medicine. Doing this puts you at risk for problems and can make the medicine less effective.  Ask your health care provider about side effects or reactions to medicines that you should watch for. Contact a health care provider if:  You think you are having a reaction to a medicine you are taking.  You have headaches that keep coming back (recurring).  You feel dizzy.  You have  swelling in your ankles.  You have trouble with your vision. Get help right away if:  You develop a severe headache or confusion.  You have unusual weakness or numbness.  You feel faint.  You have severe pain in your chest or abdomen.  You vomit repeatedly.  You have trouble breathing. Summary  Hypertension is when the force of blood pumping through your arteries is too strong. If this condition is not controlled, it may put you at risk for serious complications.  Your personal target blood pressure may vary depending on your medical conditions, your age, and other factors. For most people, a normal blood pressure is less than 120/80.  Hypertension is treated with lifestyle changes, medicines, or a combination of both. Lifestyle changes include weight loss, eating a healthy, low-sodium diet, exercising more, and limiting alcohol. This information is not intended to replace advice given to you by your health care provider. Make sure you discuss any questions you have with your health care provider. Document Released: 01/05/2005 Document Revised: 12/04/2015 Document Reviewed: 12/04/2015 Elsevier Interactive Patient Education  [employer].

## 2016-10-22 MED FILL — metFORMIN HCL 500 MG TABS: 500 | 30 days supply | Qty: 60 | Fill #0

## 2016-10-23 ENCOUNTER — Telehealth: Payer: Self-pay | Admitting: *Deleted

## 2016-10-23 ENCOUNTER — Ambulatory Visit (HOSPITAL_COMMUNITY)
Admission: RE | Admit: 2016-10-23 | Discharge: 2016-10-23 | Disposition: A | Discharge status: Home / Self Care | Payer: Medicaid Other | Source: Ambulatory Visit | Attending: Internal Medicine | Admitting: Internal Medicine

## 2016-10-23 DIAGNOSIS — I6521 Occlusion and stenosis of right carotid artery: Secondary | ICD-10-CM | POA: Insufficient documentation

## 2016-10-23 DIAGNOSIS — Z951 Presence of aortocoronary bypass graft: Secondary | ICD-10-CM | POA: Diagnosis not present

## 2016-10-23 DIAGNOSIS — H538 Other visual disturbances: Secondary | ICD-10-CM | POA: Diagnosis present

## 2016-10-23 NOTE — Progress Notes (Signed)
VASCULAR LAB PRELIMINARY  PRELIMINARY  PRELIMINARY  PRELIMINARY  Carotid duplex completed.    Preliminary report:  Right - 1% to 39% Lowest end of range ICA stenosis. Vertebral artery flow is antegrade. Left - No obvious evidence of extracranial ICA stenosis. Vertebral artery flow is antegrade.   Briseida Gittings, RVS 10/23/2016, 11:47 AM

## 2016-10-23 NOTE — Telephone Encounter (Signed)
MA spoke with Orson Ape S and completed a pre cert authorization for US carotid. [place] was approved and reference number is [id number]

## 2016-10-24 LAB — VAS US CAROTID
LCCADDIAS: -22 cm/s
LCCADSYS: -93 cm/s
LEFT ECA DIAS: -14 cm/s
LEFT VERTEBRAL DIAS: 9 cm/s
LICADDIAS: -28 cm/s
LICADSYS: -65 cm/s
LICAPDIAS: -16 cm/s
Left CCA prox dias: 17 cm/s
Left CCA prox sys: 105 cm/s
Left ICA prox sys: -90 cm/s
RCCADSYS: -55 cm/s
RCCAPSYS: 100 cm/s
RIGHT ECA DIAS: -13 cm/s
RIGHT VERTEBRAL DIAS: -10 cm/s
Right CCA prox dias: 16 cm/s

## 2016-10-26 ENCOUNTER — Telehealth: Payer: Self-pay | Admitting: *Deleted

## 2016-10-26 NOTE — Telephone Encounter (Signed)
-----   Message from Quentin Angst, MD sent at 10/26/2016  1:05 PM EDT ----- Please inform patient that ultrasound of the neck vessels showed No significant occlusion.

## 2016-10-26 NOTE — Telephone Encounter (Signed)
MA unable to reach patient on three listed contacts. !!!Please inform patient of ultrasound showing no blockage in vessels!!!

## 2016-11-20 MED FILL — metFORMIN HCL 500 MG TABS: 500 | 30 days supply | Qty: 60 | Fill #8

## 2016-11-20 MED FILL — METOPROLOL TARTRATE 25 MG T: 25 | 30 days supply | Qty: 30 | Fill #8

## 2016-11-20 MED FILL — LISINOPRIL 10 MG TABS: 10 | 30 days supply | Qty: 30 | Fill #8

## 2016-11-25 ENCOUNTER — Ambulatory Visit: Payer: Medicaid Other | Admitting: Internal Medicine

## 2016-11-30 ENCOUNTER — Telehealth: Payer: Self-pay | Admitting: *Deleted

## 2016-11-30 ENCOUNTER — Ambulatory Visit: Payer: Self-pay | Admitting: Neurology

## 2016-11-30 NOTE — Telephone Encounter (Signed)
No showed new patient appointment. 

## 2016-12-01 ENCOUNTER — Encounter: Payer: Self-pay | Admitting: Neurology

## 2016-12-22 MED FILL — METOPROLOL TARTRATE 25 MG T: 25 | 15 days supply | Qty: 15 | Fill #0

## 2016-12-25 MED FILL — LISINOPRIL 10 MG TABS: 10 | 30 days supply | Qty: 30 | Fill #0

## 2016-12-30 ENCOUNTER — Ambulatory Visit: Payer: Medicaid Other | Admitting: Internal Medicine

## 2017-01-20 MED FILL — LISINOPRIL 10 MG TABS: 10 | 30 days supply | Qty: 30 | Fill #1

## 2017-01-20 MED FILL — metFORMIN HCL 500 MG TABS: 500 | 30 days supply | Qty: 60 | Fill #1

## 2017-01-20 MED FILL — METOPROLOL TARTRATE 25 MG T: 25 | 15 days supply | Qty: 15 | Fill #1

## 2017-01-27 ENCOUNTER — Ambulatory Visit: Payer: Medicaid Other | Attending: Internal Medicine | Admitting: Physician Assistant

## 2017-01-27 VITALS — BP 113/70 | HR 57 | Temp 97.6°F | Resp 16 | Ht 71.0 in | Wt 170.2 lb

## 2017-01-27 DIAGNOSIS — E119 Type 2 diabetes mellitus without complications: Secondary | ICD-10-CM | POA: Diagnosis not present

## 2017-01-27 DIAGNOSIS — M79675 Pain in left toe(s): Secondary | ICD-10-CM | POA: Diagnosis not present

## 2017-01-27 DIAGNOSIS — E1151 Type 2 diabetes mellitus with diabetic peripheral angiopathy without gangrene: Secondary | ICD-10-CM | POA: Insufficient documentation

## 2017-01-27 DIAGNOSIS — M1000 Idiopathic gout, unspecified site: Secondary | ICD-10-CM

## 2017-01-27 DIAGNOSIS — M792 Neuralgia and neuritis, unspecified: Secondary | ICD-10-CM

## 2017-01-27 DIAGNOSIS — M79672 Pain in left foot: Secondary | ICD-10-CM | POA: Insufficient documentation

## 2017-01-27 DIAGNOSIS — Z7982 Long term (current) use of aspirin: Secondary | ICD-10-CM | POA: Insufficient documentation

## 2017-01-27 DIAGNOSIS — Z79890 Hormone replacement therapy (postmenopausal): Secondary | ICD-10-CM | POA: Diagnosis not present

## 2017-01-27 DIAGNOSIS — Z79899 Other long term (current) drug therapy: Secondary | ICD-10-CM | POA: Diagnosis not present

## 2017-01-27 DIAGNOSIS — I252 Old myocardial infarction: Secondary | ICD-10-CM | POA: Insufficient documentation

## 2017-01-27 DIAGNOSIS — E7800 Pure hypercholesterolemia, unspecified: Secondary | ICD-10-CM | POA: Insufficient documentation

## 2017-01-27 DIAGNOSIS — K219 Gastro-esophageal reflux disease without esophagitis: Secondary | ICD-10-CM | POA: Diagnosis not present

## 2017-01-27 DIAGNOSIS — G5792 Unspecified mononeuropathy of left lower limb: Secondary | ICD-10-CM | POA: Diagnosis not present

## 2017-01-27 DIAGNOSIS — I10 Essential (primary) hypertension: Secondary | ICD-10-CM

## 2017-01-27 DIAGNOSIS — F419 Anxiety disorder, unspecified: Secondary | ICD-10-CM | POA: Insufficient documentation

## 2017-01-27 DIAGNOSIS — Z7984 Long term (current) use of oral hypoglycemic drugs: Secondary | ICD-10-CM | POA: Diagnosis not present

## 2017-01-27 DIAGNOSIS — I2510 Atherosclerotic heart disease of native coronary artery without angina pectoris: Secondary | ICD-10-CM | POA: Diagnosis not present

## 2017-01-27 DIAGNOSIS — E039 Hypothyroidism, unspecified: Secondary | ICD-10-CM

## 2017-01-27 LAB — GLUCOSE, POCT (MANUAL RESULT ENTRY): POC Glucose: 118 mg/dl — AB (ref 70–99)

## 2017-01-27 LAB — POCT GLYCOSYLATED HEMOGLOBIN (HGB A1C): HEMOGLOBIN A1C: 6.2

## 2017-01-27 MED ORDER — ALLOPURINOL 100 MG PO TABS: 100.0000 mg | ORAL_TABLET | Freq: Every day | ORAL | 6 refills | Status: DC

## 2017-01-27 MED ORDER — GABAPENTIN 100 MG PO CAPS: 100.0000 mg | ORAL_CAPSULE | Freq: Three times a day (TID) | ORAL | 3 refills | Status: DC

## 2017-01-27 MED ORDER — LEVOTHYROXINE SODIUM 50 MCG PO TABS: 50.0000 ug | ORAL_TABLET | Freq: Every day | ORAL | 3 refills | Status: DC

## 2017-01-27 MED ORDER — LEVOTHYROXINE SODIUM 50 MCG PO TABS: 50.0000 ug | ORAL_TABLET | Freq: Every day | ORAL | 1 refills | Status: DC

## 2017-01-27 MED FILL — ALLOPURINOL 100 MG TABLET: 100 | 30 days supply | Qty: 30 | Fill #0

## 2017-01-27 MED FILL — LEVOTHYROXINE 50 MCG TABLET: 50 | 30 days supply | Qty: 30 | Fill #0

## 2017-01-27 NOTE — Progress Notes (Signed)
Patient ID: Willie Walker, male   DOB: June 28, 1965, 52 y.o.   MRN: [id number]   Fahim Kats, is a 52 y.o. male  [id number]  [id number]  DOB - 1966-01-05  Subjective:  Chief Complaint and HPI: Earley Grobe is a 52 y.o. male here today for L small toe pain.  He has been having the pain now for about 1 month.  NKI.  Says blood sugars are well controlled and always less than 150; usu <130.  He has only been taking lisinopril, metoprolol, aspirin, and metformin.  He hasn't been taking any other meds.   ROS:   Constitutional:  No f/c, No night sweats, No unexplained weight loss. EENT:  No vision changes, No blurry vision, No hearing changes. No mouth, throat, or ear problems.  Respiratory: No cough, No SOB Cardiac: No CP, no palpitations GI:  No abd pain, No N/V/D. GU: No Urinary s/sx Musculoskeletal: No joint pain Neuro: No headache, no dizziness, no motor weakness.  Skin: No rash Endocrine:  No polydipsia. No polyuria.  Psych: Denies SI/HI  No problems updated.  ALLERGIES: Allergies  Allergen Reactions  . Crestor [Rosuvastatin] Other (See Comments)    "Makes my legs hurt" - tolerates simvastatin     PAST MEDICAL HISTORY: Past Medical History:  Diagnosis Date  . Anginal pain (HCC)   . Anxiety   . Childhood asthma   . Chronic lower back pain   . Coronary artery disease   . ELECTROCARDIOGRAM, ABNORMAL   . GERD (gastroesophageal reflux disease)   . Gout   . Headache(784.0) 09/18/2011   "maybe twice/wk; pressure on the brain"  . High cholesterol   . Hypertension   . Kidney stones   . Lower GI bleed 09/18/2011   "clots and everything; not lately"  . Myocardial infarction (HCC) 10/2005  . Peripheral vascular disease (HCC)    LLE  . Pneumonia 1990's  . Seizures (HCC) 09/18/2011   "blanks out on me"/wife's report  . Shortness of breath 09/18/11   "@ rest; lying down; w/exertion"  . Type I diabetes mellitus (HCC)     MEDICATIONS AT HOME: Prior to Admission  medications   Medication Sig Start Date End Date Taking? Authorizing Provider  acetaminophen-codeine (TYLENOL #3) 300-30 MG tablet Take 1 tablet by mouth every 4 (four) hours as needed. 05/20/16  Yes Quentin Angst, MD  lisinopril (PRINIVIL,ZESTRIL) 10 MG tablet Take 1 tablet (10 mg total) by mouth daily. 10/21/16  Yes Quentin Angst, MD  metFORMIN (GLUCOPHAGE) 500 MG tablet Take 1 tablet (500 mg total) by mouth 2 (two) times daily with a meal. 10/21/16  Yes Jegede, Olugbemiga E, MD  metoprolol tartrate (LOPRESSOR) 25 MG tablet Take 0.5 tablets (12.5 mg total) by mouth 2 (two) times daily. 10/21/16  Yes Quentin Angst, MD  allopurinol (ZYLOPRIM) 100 MG tablet Take 1 tablet (100 mg total) by mouth daily. 01/27/17   Anders Simmonds, PA-C  ALPRAZolam Prudy Feeler) 0.25 MG tablet Take 0.25 mg by mouth 2 (two) times daily as needed for anxiety.    [provider]  aspirin 325 MG EC tablet Take 1 tablet (325 mg total) by mouth daily. Patient not taking: Reported on 01/27/2017 12/05/15   Quentin Angst, MD  Colchicine 0.6 MG CAPS Take 1 capsule by mouth daily. Patient not taking: Reported on 01/27/2017 10/21/16   Quentin Angst, MD  fish oil-omega-3 fatty acids 1000 MG capsule Take 1 g by mouth 2 (two) times daily.  [provider]  gabapentin (NEURONTIN) 100 MG capsule Take 1 capsule (100 mg total) by mouth 3 (three) times daily. 01/27/17   Anders Simmonds, PA-C  levothyroxine (SYNTHROID, LEVOTHROID) 50 MCG tablet Take 1 tablet (50 mcg total) by mouth daily before breakfast. X 3 WKS 01/27/17   McClung, Marzella Schlein, PA-C  neomycin-polymyxin-hydrocortisone (CORTISPORIN) otic solution Place 3 drops into the left ear 4 (four) times daily. Patient not taking: Reported on 01/27/2017 02/19/16   Quentin Angst, MD  ramelteon (ROZEREM) 8 MG tablet Take 1 tablet (8 mg total) by mouth at bedtime. Patient not taking: Reported on 01/27/2017 10/21/16   Quentin Angst, MD      Objective:  EXAM:   Vitals:   01/27/17 0927  BP: 113/70  Pulse: (!) 57  Resp: 16  Temp: 97.6 F (36.4 C)  TempSrc: Oral  SpO2: 99%  Weight: 170 lb 3.2 oz (77.2 kg)  Height: 5\' 11"  (1.803 m)    General appearance : A&OX3. NAD. Non-toxic-appearing HEENT: Atraumatic and Normocephalic.  PERRLA. EOM intact.  TM clear B. Mouth-MMM, post pharynx WNL w/o erythema, No PND. Neck: supple, no JVD. No cervical lymphadenopathy. No thyromegaly Chest/Lungs:  Breathing-non-labored, Good air entry bilaterally, breath sounds normal without rales, rhonchi, or wheezing  CVS: S1 S2 regular, no murmurs, gallops, rubs  Extremities: Bilateral Lower Ext shows no edema, both legs are warm to touch with = pulse throughout.  There is a callous on the underside bt 4th and 5th digit; there is a patch of dry skin that is hyperpigmented.  There is no necrosis or gangrene(Dr Hyman Hopes also looked at this).  N-V intact Neurology:  CN II-XII grossly intact, Non focal.   Psych:  TP linear. J/I WNL. Normal speech. Appropriate eye contact and affect.  Skin:  No Rash  Data Review Lab Results  Component Value Date   HGBA1C 6.2 01/27/2017   HGBA1C 6.2 10/21/2016   HGBA1C 6.1 05/20/2016     Assessment & Plan   1. Type 2 diabetes mellitus without complication, without long-term current use of insulin (HCC) Adequate control-continue current regimen - Glucose (CBG) - POCT glycosylated hemoglobin (Hb A1C) - POCT ABI Screening Pilot No Charge=normal - gabapentin (NEURONTIN) 100 MG capsule; Take 1 capsule (100 mg total) by mouth 3 (three) times daily.  Dispense: 90 capsule; Refill: 3 - Comprehensive metabolic panel  2. Pain of toe of left foot - POCT ABI Screening Pilot No Charge - Ambulatory referral to Podiatry  3. Acute idiopathic gout, unspecified site restart - allopurinol (ZYLOPRIM) 100 MG tablet; Take 1 tablet (100 mg total) by mouth daily.  Dispense: 30 tablet; Refill: 6  4. Hypothyroidism,  unspecified type Not taking meds for at least several months now - levothyroxine (SYNTHROID, LEVOTHROID) 50 MCG tablet; Take 1 tablet (50 mcg total) by mouth daily before breakfast. X 3 WKS  Dispense: 45 tablet; Refill: 3 - Thyroid Panel With TSH  5. Neuropathic pain of left foot restart - gabapentin (NEURONTIN) 100 MG capsule; Take 1 capsule (100 mg total) by mouth 3 (three) times daily.  Dispense: 90 capsule; Refill: 3  6. Essential hypertension Controlled.  Continue current regime - Comprehensive metabolic panel Spent > face to face counseling about medication compliance.      Patient have been counseled extensively about nutrition and exercise  Return in about 3 months (around 04/27/2017) for Dr Hyman Hopes; DM and htn f/up.  The patient was given clear instructions to go to ER or return to  medical center if symptoms don't improve, worsen or new problems develop. The patient verbalized understanding. The patient was told to call to get lab results if they haven't heard anything in the next week.     Georgian CoAngela McClung, PA-C [facility] [place], [place] [phone]   01/27/2017, 9:53 AM

## 2017-01-27 NOTE — Progress Notes (Signed)
Patient stated his left bottom foot are in pain,sore, and black. Patient stated it started to happen when cold season start.

## 2017-01-28 ENCOUNTER — Other Ambulatory Visit: Payer: Self-pay | Admitting: Physician Assistant

## 2017-01-28 LAB — COMPREHENSIVE METABOLIC PANEL
A/G RATIO: 1.4 (ref 1.2–2.2)
ALT: 17 IU/L (ref 0–44)
AST: 26 IU/L (ref 0–40)
Albumin: 4.6 g/dL (ref 3.5–5.5)
Alkaline Phosphatase: 89 IU/L (ref 39–117)
BILIRUBIN TOTAL: 0.2 mg/dL (ref 0.0–1.2)
BUN/Creatinine Ratio: 15 (ref 9–20)
BUN: 23 mg/dL (ref 6–24)
CHLORIDE: 103 mmol/L (ref 96–106)
CO2: 24 mmol/L (ref 20–29)
Calcium: 9.7 mg/dL (ref 8.7–10.2)
Creatinine, Ser: 1.56 mg/dL — ABNORMAL HIGH (ref 0.76–1.27)
GFR calc non Af Amer: 50 mL/min/{1.73_m2} — ABNORMAL LOW (ref >59)
GFR, EST AFRICAN AMERICAN: 58 mL/min/{1.73_m2} — AB (ref >59)
GLOBULIN, TOTAL: 3.4 g/dL (ref 1.5–4.5)
Glucose: 108 mg/dL — ABNORMAL HIGH (ref 65–99)
POTASSIUM: 5.1 mmol/L (ref 3.5–5.2)
SODIUM: 140 mmol/L (ref 134–144)
TOTAL PROTEIN: 8 g/dL (ref 6.0–8.5)

## 2017-01-28 LAB — THYROID PANEL WITH TSH
FREE THYROXINE INDEX: 1.9 (ref 1.2–4.9)
T3 UPTAKE RATIO: 28 % (ref 24–39)
T4, Total: 6.7 ug/dL (ref 4.5–12.0)
TSH: 1.17 u[IU]/mL (ref 0.450–4.500)

## 2017-02-01 ENCOUNTER — Telehealth: Payer: Self-pay

## 2017-02-01 NOTE — Telephone Encounter (Signed)
Contacted pt to go over lab results pt is aware of result and doesn't have any questions or concerns

## 2017-03-11 MED FILL — LISINOPRIL 10 MG TABS: 10 | 30 days supply | Qty: 30 | Fill #2

## 2017-03-11 MED FILL — metFORMIN HCL 500 MG TABS: 500 | 30 days supply | Qty: 60 | Fill #2

## 2017-03-17 ENCOUNTER — Encounter: Payer: Self-pay | Admitting: Internal Medicine

## 2017-03-17 ENCOUNTER — Ambulatory Visit: Payer: Medicaid Other | Attending: Internal Medicine | Admitting: Internal Medicine

## 2017-03-17 ENCOUNTER — Encounter: Payer: Self-pay | Admitting: Pharmacist

## 2017-03-17 VITALS — BP 120/82 | HR 65 | Temp 97.5°F | Resp 12 | Ht 71.0 in | Wt 179.0 lb

## 2017-03-17 DIAGNOSIS — K219 Gastro-esophageal reflux disease without esophagitis: Secondary | ICD-10-CM | POA: Insufficient documentation

## 2017-03-17 DIAGNOSIS — M542 Cervicalgia: Secondary | ICD-10-CM | POA: Insufficient documentation

## 2017-03-17 DIAGNOSIS — I252 Old myocardial infarction: Secondary | ICD-10-CM | POA: Diagnosis not present

## 2017-03-17 DIAGNOSIS — Z794 Long term (current) use of insulin: Secondary | ICD-10-CM | POA: Insufficient documentation

## 2017-03-17 DIAGNOSIS — E1151 Type 2 diabetes mellitus with diabetic peripheral angiopathy without gangrene: Secondary | ICD-10-CM | POA: Insufficient documentation

## 2017-03-17 DIAGNOSIS — E7800 Pure hypercholesterolemia, unspecified: Secondary | ICD-10-CM | POA: Insufficient documentation

## 2017-03-17 DIAGNOSIS — Z7982 Long term (current) use of aspirin: Secondary | ICD-10-CM | POA: Diagnosis not present

## 2017-03-17 DIAGNOSIS — M109 Gout, unspecified: Secondary | ICD-10-CM | POA: Diagnosis not present

## 2017-03-17 DIAGNOSIS — I2510 Atherosclerotic heart disease of native coronary artery without angina pectoris: Secondary | ICD-10-CM | POA: Insufficient documentation

## 2017-03-17 DIAGNOSIS — Z87442 Personal history of urinary calculi: Secondary | ICD-10-CM | POA: Insufficient documentation

## 2017-03-17 DIAGNOSIS — E119 Type 2 diabetes mellitus without complications: Secondary | ICD-10-CM

## 2017-03-17 DIAGNOSIS — I509 Heart failure, unspecified: Secondary | ICD-10-CM | POA: Diagnosis not present

## 2017-03-17 DIAGNOSIS — I10 Essential (primary) hypertension: Secondary | ICD-10-CM

## 2017-03-17 DIAGNOSIS — Z955 Presence of coronary angioplasty implant and graft: Secondary | ICD-10-CM | POA: Insufficient documentation

## 2017-03-17 DIAGNOSIS — Z79899 Other long term (current) drug therapy: Secondary | ICD-10-CM | POA: Diagnosis not present

## 2017-03-17 DIAGNOSIS — R42 Dizziness and giddiness: Secondary | ICD-10-CM | POA: Diagnosis not present

## 2017-03-17 DIAGNOSIS — Z951 Presence of aortocoronary bypass graft: Secondary | ICD-10-CM | POA: Insufficient documentation

## 2017-03-17 DIAGNOSIS — I110 Hypertensive heart disease with heart failure: Secondary | ICD-10-CM | POA: Diagnosis not present

## 2017-03-17 DIAGNOSIS — F419 Anxiety disorder, unspecified: Secondary | ICD-10-CM | POA: Diagnosis not present

## 2017-03-17 LAB — GLUCOSE, POCT (MANUAL RESULT ENTRY): POC GLUCOSE: 168 mg/dL — AB (ref 70–99)

## 2017-03-17 LAB — POCT GLYCOSYLATED HEMOGLOBIN (HGB A1C): Hemoglobin A1C: 6.2

## 2017-03-17 MED ORDER — TRAMADOL HCL 50 MG PO TABS: 50.0000 mg | ORAL_TABLET | Freq: Three times a day (TID) | ORAL | 0 refills | Status: DC | PRN

## 2017-03-17 MED ORDER — METOPROLOL TARTRATE 25 MG PO TABS: 12.5000 mg | ORAL_TABLET | Freq: Every day | ORAL | 3 refills | Status: DC

## 2017-03-17 MED FILL — METOPROLOL TARTRATE 25 MG T: 25 | 30 days supply | Qty: 15 | Fill #0

## 2017-03-17 NOTE — Progress Notes (Signed)
PA completed and approved for tramadol. Approval # [id number]

## 2017-03-17 NOTE — Progress Notes (Signed)
Follow up neck pain

## 2017-03-17 NOTE — Patient Instructions (Signed)
Diabetes Mellitus and Nutrition When you have diabetes (diabetes mellitus), it is very important to have healthy eating habits because your blood sugar (glucose) levels are greatly affected by what you eat and drink. Eating healthy foods in the appropriate amounts, at about the same times every day, can help you:  Control your blood glucose.  Lower your risk of heart disease.  Improve your blood pressure.  Reach or maintain a healthy weight.  Every person with diabetes is different, and each person has different needs for a meal plan. Your health care provider may recommend that you work with a diet and nutrition specialist (dietitian) to make a meal plan that is best for you. Your meal plan may vary depending on factors such as:  The calories you need.  The medicines you take.  Your weight.  Your blood glucose, blood pressure, and cholesterol levels.  Your activity level.  Other health conditions you have, such as heart or kidney disease.  How do carbohydrates affect me? Carbohydrates affect your blood glucose level more than any other type of food. Eating carbohydrates naturally increases the amount of glucose in your blood. Carbohydrate counting is a method for keeping track of how many carbohydrates you eat. Counting carbohydrates is important to keep your blood glucose at a healthy level, especially if you use insulin or take certain oral diabetes medicines. It is important to know how many carbohydrates you can safely have in each meal. This is different for every person. Your dietitian can help you calculate how many carbohydrates you should have at each meal and for snack. Foods that contain carbohydrates include:  Bread, cereal, rice, pasta, and crackers.  Potatoes and corn.  Peas, beans, and lentils.  Milk and yogurt.  Fruit and juice.  Desserts, such as cakes, cookies, ice cream, and candy.  How does alcohol affect me? Alcohol can cause a sudden decrease in blood  glucose (hypoglycemia), especially if you use insulin or take certain oral diabetes medicines. Hypoglycemia can be a life-threatening condition. Symptoms of hypoglycemia (sleepiness, dizziness, and confusion) are similar to symptoms of having too much alcohol. If your health care provider says that alcohol is safe for you, follow these guidelines:  Limit alcohol intake to no more than 1 drink per day for nonpregnant women and 2 drinks per day for men. One drink equals 12 oz of beer, 5 oz of wine, or 1 oz of hard liquor.  Do not drink on an empty stomach.  Keep yourself hydrated with water, diet soda, or unsweetened iced tea.  Keep in mind that regular soda, juice, and other mixers may contain a lot of sugar and must be counted as carbohydrates.  What are tips for following this plan? Reading food labels  Start by checking the serving size on the label. The amount of calories, carbohydrates, fats, and other nutrients listed on the label are based on one serving of the food. Many foods contain more than one serving per package.  Check the total grams (g) of carbohydrates in one serving. You can calculate the number of servings of carbohydrates in one serving by dividing the total carbohydrates by 15. For example, if a food has 30 g of total carbohydrates, it would be equal to 2 servings of carbohydrates.  Check the number of grams (g) of saturated and trans fats in one serving. Choose foods that have low or no amount of these fats.  Check the number of milligrams (mg) of sodium in one serving. Most people   should limit total sodium intake to less than 2,300 mg per day.  Always check the nutrition information of foods labeled as "low-fat" or "nonfat". These foods may be higher in added sugar or refined carbohydrates and should be avoided.  Talk to your dietitian to identify your daily goals for nutrients listed on the label. Shopping  Avoid buying canned, premade, or processed foods. These  foods tend to be high in fat, sodium, and added sugar.  Shop around the outside edge of the grocery store. This includes fresh fruits and vegetables, bulk grains, fresh meats, and fresh dairy. Cooking  Use low-heat cooking methods, such as baking, instead of high-heat cooking methods like deep frying.  Cook using healthy oils, such as olive, canola, or sunflower oil.  Avoid cooking with butter, cream, or high-fat meats. Meal planning  Eat meals and snacks regularly, preferably at the same times every day. Avoid going long periods of time without eating.  Eat foods high in fiber, such as fresh fruits, vegetables, beans, and whole grains. Talk to your dietitian about how many servings of carbohydrates you can eat at each meal.  Eat 4-6 ounces of lean protein each day, such as lean meat, chicken, fish, eggs, or tofu. 1 ounce is equal to 1 ounce of meat, chicken, or fish, 1 egg, or 1/4 cup of tofu.  Eat some foods each day that contain healthy fats, such as avocado, nuts, seeds, and fish. Lifestyle   Check your blood glucose regularly.  Exercise at least 30 minutes 5 or more days each week, or as told by your health care provider.  Take medicines as told by your health care provider.  Do not use any products that contain nicotine or tobacco, such as cigarettes and e-cigarettes. If you need help quitting, ask your health care provider.  Work with a counselor or diabetes educator to identify strategies to manage stress and any emotional and social challenges. What are some questions to ask my health care provider?  Do I need to meet with a diabetes educator?  Do I need to meet with a dietitian?  What number can I call if I have questions?  When are the best times to check my blood glucose? Where to find more information:  American Diabetes Association: diabetes.org/food-and-fitness/food  Academy of Nutrition and Dietetics:  www.eatright.org/resources/health/diseases-and-conditions/diabetes  National Institute of Diabetes and Digestive and Kidney Diseases (NIH): www.niddk.nih.gov/health-information/diabetes/overview/diet-eating-physical-activity Summary  A healthy meal plan will help you control your blood glucose and maintain a healthy lifestyle.  Working with a diet and nutrition specialist (dietitian) can help you make a meal plan that is best for you.  Keep in mind that carbohydrates and alcohol have immediate effects on your blood glucose levels. It is important to count carbohydrates and to use alcohol carefully. This information is not intended to replace advice given to you by your health care provider. Make sure you discuss any questions you have with your health care provider. Document Released: 10/02/2004 Document Revised: 02/10/2016 Document Reviewed: 02/10/2016 Elsevier Interactive Patient Education  2018 [employer] Diabetes Mellitus and Exercise Exercising regularly is important for your overall health, especially when you have diabetes (diabetes mellitus). Exercising is not only about losing weight. It has many health benefits, such as increasing muscle strength and bone density and reducing body fat and stress. This leads to improved fitness, flexibility, and endurance, all of which result in better overall health. Exercise has additional benefits for people with diabetes, including:  Reducing appetite.  Helping to lower   and control blood glucose.  Lowering blood pressure.  Helping to control amounts of fatty substances (lipids) in the blood, such as cholesterol and triglycerides.  Helping the body to respond better to insulin (improving insulin sensitivity).  Reducing how much insulin the body needs.  Decreasing the risk for heart disease by: ? Lowering cholesterol and triglyceride levels. ? Increasing the levels of good cholesterol. ? Lowering blood glucose levels.  What is my  activity plan? Your health care provider or certified diabetes educator can help you make a plan for the type and frequency of exercise (activity plan) that works for you. Make sure that you:  Do at least 150 minutes of moderate-intensity or vigorous-intensity exercise each week. This could be brisk walking, biking, or water aerobics. ? Do stretching and strength exercises, such as yoga or weightlifting, at least 2 times a week. ? Spread out your activity over at least 3 days of the week.  Get some form of physical activity every day. ? Do not go more than 2 days in a row without some kind of physical activity. ? Avoid being inactive for more than 90 minutes at a time. Take frequent breaks to walk or stretch.  Choose a type of exercise or activity that you enjoy, and set realistic goals.  Start slowly, and gradually increase the intensity of your exercise over time.  What do I need to know about managing my diabetes?  Check your blood glucose before and after exercising. ? If your blood glucose is higher than 240 mg/dL (13.3 mmol/L) before you exercise, check your urine for ketones. If you have ketones in your urine, do not exercise until your blood glucose returns to normal.  Know the symptoms of low blood glucose (hypoglycemia) and how to treat it. Your risk for hypoglycemia increases during and after exercise. Common symptoms of hypoglycemia can include: ? Hunger. ? Anxiety. ? Sweating and feeling clammy. ? Confusion. ? Dizziness or feeling light-headed. ? Increased heart rate or palpitations. ? Blurry vision. ? Tingling or numbness around the mouth, lips, or tongue. ? Tremors or shakes. ? Irritability.  Keep a rapid-acting carbohydrate snack available before, during, and after exercise to help prevent or treat hypoglycemia.  Avoid injecting insulin into areas of the body that are going to be exercised. For example, avoid injecting insulin into: ? The arms, when playing  tennis. ? The legs, when jogging.  Keep records of your exercise habits. Doing this can help you and your health care provider adjust your diabetes management plan as needed. Write down: ? Food that you eat before and after you exercise. ? Blood glucose levels before and after you exercise. ? The type and amount of exercise you have done. ? When your insulin is expected to peak, if you use insulin. Avoid exercising at times when your insulin is peaking.  When you start a new exercise or activity, work with your health care provider to make sure the activity is safe for you, and to adjust your insulin, medicines, or food intake as needed.  Drink plenty of water while you exercise to prevent dehydration or heat stroke. Drink enough fluid to keep your urine clear or pale yellow. This information is not intended to replace advice given to you by your health care provider. Make sure you discuss any questions you have with your health care provider. Document Released: 03/28/2003 Document Revised: 07/26/2015 Document Reviewed: 06/17/2015 Elsevier Interactive Patient Education  2018 [employer]  

## 2017-03-17 NOTE — Progress Notes (Signed)
Subjective:  Patient ID: Willie Walker, male    DOB: 11-11-1965  Age: 52 y.o. MRN: [id number]  CC: Follow-up  HPI Willie Walker is a 52 year old male with a PMH of HTN, T2DM, and CHF, who presents today for c/o neck pain.   Reports fall 3 weeks ago. Says he got up to go to the bathroom and after urinating he became dizzy and "blacked out". He fell on his back. He is unsure of how long he was down but his girlfriend woke him up. He did not go to the hospital afterwards. Today, he c/o of 8 out of 10 pain and soreness in his neck that shoots down to his back. He has taken leftover tramadol that he had at home, which has helped his pain. However, he has been out for the past 4-5 days. He denies headaches, numbness (other then is chronic diabetic neuropathy), tingling, or weakness.  He c/o of increased dizziness. Denies hypoglycemic episodes, fasting blood sugar has been 120s. Reports adherence to medication, does not check BP at home. Denies bleeding, fevers, N/V/D.  He has not completed his Echocardiogram ordered 10/2016. Has not had anymore falls. Denies new onset chest pain (ocassioanl angina), palpitation, or SOB.    Past Medical History:  Diagnosis Date  . Anginal pain ([place])   . Anxiety   . Childhood asthma   . Chronic lower back pain   . Coronary artery disease   . ELECTROCARDIOGRAM, ABNORMAL   . GERD (gastroesophageal reflux disease)   . Gout   . Headache(784.0) 09/18/2011   "maybe twice/wk; pressure on the brain"  . High cholesterol   . Hypertension   . Kidney stones   . Lower GI bleed 09/18/2011   "clots and everything; not lately"  . Myocardial infarction ([place]) 10/2005  . Peripheral vascular disease (HCC)    LLE  . Pneumonia 1990's  . Seizures ([place]) 09/18/2011   "blanks out on me"/wife's report  . Shortness of breath 09/18/11   "@ rest; lying down; w/exertion"  . Type I diabetes mellitus ([place])    Past Surgical History:  Procedure Laterality Date  . [place]   "mugged"  . CARDIAC CATHETERIZATION N/A 08/01/2015   Procedure: Left Heart Cath and Coronary Angiography;  Surgeon: Peter M Martinique, MD;  Location: [facility];  Service: Cardiovascular;  Laterality: N/A;  . CORONARY ANGIOPLASTY  09/18/2011  . CORONARY ANGIOPLASTY WITH STENT PLACEMENT  10/2005   "9"  . CORONARY ARTERY BYPASS GRAFT N/A 08/05/2015   Procedure: CORONARY ARTERY BYPASS GRAFTING (CABG), ON PUMP, TIMES FIVE, USING LEFT INTERNAL MAMMARY ARTERY AND LEFT RADIAL ARTERY, RIGHT GREATER SAPHENOUS VEIN HARVESTED ENDOSCOPICALLY;  Surgeon: Melrose Nakayama, MD;  Location: [facility];  Service: Open Heart Surgery;  Laterality: N/A;  . [place] "mugging"  . PERCUTANEOUS CORONARY STENT INTERVENTION (PCI-S) N/A 09/18/2011   Procedure: PERCUTANEOUS CORONARY STENT INTERVENTION (PCI-S);  Surgeon: Peter M Martinique, MD;  Location: [facility] CATH LAB;  Service: Cardiovascular;  Laterality: N/A;  . RADIAL ARTERY HARVEST Left 08/05/2015   Procedure: RADIAL ARTERY HARVEST;  Surgeon: Melrose Nakayama, MD;  Location: [facility];  Service: Open Heart Surgery;  Laterality: Left;  . TEE WITHOUT CARDIOVERSION N/A 08/05/2015   Procedure: TRANSESOPHAGEAL ECHOCARDIOGRAM (TEE);  Surgeon: Melrose Nakayama, MD;  Location: [facility];  Service: Open Heart Surgery;  Laterality: N/A;   Allergies  Allergen Reactions  . Crestor [Rosuvastatin] Other (  See Comments)    "Makes my legs hurt" - tolerates simvastatin      Outpatient Medications Prior to Visit  Medication Sig Dispense Refill  . acetaminophen-codeine (TYLENOL #3) 300-30 MG tablet Take 1 tablet by mouth every 4 (four) hours as needed. 60 tablet 0  . allopurinol (ZYLOPRIM) 100 MG tablet Take 1 tablet (100 mg total) by mouth daily. 30 tablet 6  . Colchicine 0.6 MG CAPS Take 1 capsule by mouth daily. 14 capsule 0  . fish oil-omega-3 fatty acids 1000 MG capsule Take 1 g by mouth 2 (two) times daily.    Marland Kitchen lisinopril (PRINIVIL,ZESTRIL) 10  MG tablet Take 1 tablet (10 mg total) by mouth daily. 90 tablet 3  . metFORMIN (GLUCOPHAGE) 500 MG tablet Take 1 tablet (500 mg total) by mouth 2 (two) times daily with a meal. 180 tablet 3  . aspirin 325 MG EC tablet Take 1 tablet (325 mg total) by mouth daily. (Patient not taking: Reported on 01/27/2017) 60 tablet 3  . gabapentin (NEURONTIN) 100 MG capsule Take 1 capsule (100 mg total) by mouth 3 (three) times daily. (Patient not taking: Reported on 03/17/2017) 90 capsule 3  . ramelteon (ROZEREM) 8 MG tablet Take 1 tablet (8 mg total) by mouth at bedtime. (Patient not taking: Reported on 03/17/2017) 30 tablet 0  . ALPRAZolam (XANAX) 0.25 MG tablet Take 0.25 mg by mouth 2 (two) times daily as needed for anxiety.    . metoprolol tartrate (LOPRESSOR) 25 MG tablet Take 0.5 tablets (12.5 mg total) by mouth 2 (two) times daily. (Patient not taking: Reported on 03/17/2017) 180 tablet 3  . neomycin-polymyxin-hydrocortisone (CORTISPORIN) otic solution Place 3 drops into the left ear 4 (four) times daily. (Patient not taking: Reported on 01/27/2017) 10 mL 0   No facility-administered medications prior to visit.     ROS Review of Systems  General: negative for fever, weight loss, appetite change Eyes: no visual symptoms. ENT: no ear symptoms, no sinus tenderness, no nasal congestion or sore throat. Neck: no pain  Respiratory: no wheezing, shortness of breath, cough Cardiovascular: denies chest pain, no dyspnea on exertion, no pedal edema, no orthopnea. Gastrointestinal: no abdominal pain, no diarrhea, no constipation Genito-Urinary: no urinary frequency, no dysuria, no polyuria. Neurological: Reports increased dizziness. Denies headaches, incontinence, saddle numbness,  seizures, or tremors Musculoskeletal: Pain of neck and back. Denies pain of extremities.  Skin: no pruritus, no rash. Psychological: no depression, no anxiety,     Objective:  BP 120/82 (BP Location: Left Arm, Patient Position:  Standing, Cuff Size: Normal)   Pulse 65   Temp (!) 97.5 F (36.4 C) (Oral)   Resp 12   Ht 5' 11"  (1.803 m)   Wt 179 lb (81.2 kg)   SpO2 97%   BMI 24.97 kg/m   BP/Weight 03/17/2017 01/27/2017 98/09/2117  Systolic BP 417 408 144  Diastolic BP 82 70 78  Wt. (Lbs) 179 170.2 167.8  BMI 24.97 23.74 23.4   Negative orthostatic BP   Physical Exam Constitutional: normal appearing, well-developed, alert and oriented Eyes: PERRLA HEENT: Head is atraumatic, normal sinuses, normal oropharynx, normal appearing tonsils and palate Neck: limited range of motion r/t pain Cardiovascular: normal rate and rhythm, normal heart sounds, no pedal edema Respiratory: clear to auscultation bilaterally, no wheezes, no rales, no rhonchi Abdomen: soft, not tender to palpation, active bowel sounds Extremities: Full ROM, no tenderness in joints Skin: warm and dry, no lesions. Neurological: alert, oriented x3, cranial nerves I-XII grossly intact ,  Strength and sensation intact. Psychological: pleasent mood.    Assessment & Plan:   1. Essential hypertension, benign Controlled Discussed the need for Echocardiogram, encouraged patient to complete when possible. Already ordered.   Decreased metoprolol from 12.31m BID to 12.519mdaily due to bradycardia and possible decrease in blood pressure.  - metoprolol tartrate (LOPRESSOR) 25 MG tablet; Take 0.5 tablets (12.5 mg total) by mouth daily.  Dispense: 90 tablet; Refill: 3  2. Dizziness Recheck kidney function - CMP14+EGFR - CBC with Differential  3. Cervical pain (neck) Neck and back pain improving. Tramadol PRN for pain relief. - traMADol (ULTRAM) 50 MG tablet; Take 1 tablet (50 mg total) by mouth every 8 (eight) hours as needed.  Dispense: 60 tablet; Refill: 0  4. Type 2 diabetes mellitus without complication, without long-term current use of insulin (HCC) Controlled.  Continue current medications - Glucose (CBG) - HgB A1c   Meds ordered this  encounter  Medications  . metoprolol tartrate (LOPRESSOR) 25 MG tablet    Sig: Take 0.5 tablets (12.5 mg total) by mouth daily.    Dispense:  90 tablet    Refill:  3  . DISCONTD: traMADol (ULTRAM) 50 MG tablet    Sig: Take 1 tablet (50 mg total) by mouth every 8 (eight) hours as needed.    Dispense:  60 tablet    Refill:  0  . traMADol (ULTRAM) 50 MG tablet    Sig: Take 1 tablet (50 mg total) by mouth every 8 (eight) hours as needed.    Dispense:  60 tablet    Refill:  0    Follow-up: Return in about 3 months (around 06/14/2017) for Follow up HTN, Follow up Pain and comorbidities.   CiJonnie KindNP Student  Evaluation and management procedures were performed by me with DNP Student in attendance, note written by DNP student under my supervision and collaboration. I have reviewed the note and I agree with the management and plan.   OlAngelica ChessmanMD, MHKeya PahaCPBuena VistaFAKarilyn CotaoHarris Regional Hospitalnd WeNorthwest Florida Gastroenterology CenterrEchoNCBig Water 03/17/2017, 5:32 PM

## 2017-03-18 LAB — CBC WITH DIFFERENTIAL/PLATELET
BASOS: 1 %
Basophils Absolute: 0 10*3/uL (ref 0.0–0.2)
EOS (ABSOLUTE): 0.3 10*3/uL (ref 0.0–0.4)
EOS: 5 %
HEMATOCRIT: 41.4 % (ref 37.5–51.0)
Hemoglobin: 14 g/dL (ref 13.0–17.7)
IMMATURE GRANS (ABS): 0 10*3/uL (ref 0.0–0.1)
Immature Granulocytes: 0 %
Lymphocytes Absolute: 3.8 10*3/uL — ABNORMAL HIGH (ref 0.7–3.1)
Lymphs: 54 %
MCH: 29.4 pg (ref 26.6–33.0)
MCHC: 33.8 g/dL (ref 31.5–35.7)
MCV: 87 fL (ref 79–97)
MONOS ABS: 0.8 10*3/uL (ref 0.1–0.9)
Monocytes: 11 %
NEUTROS ABS: 2 10*3/uL (ref 1.4–7.0)
NEUTROS PCT: 29 %
Platelets: 279 10*3/uL (ref 150–379)
RBC: 4.77 x10E6/uL (ref 4.14–5.80)
RDW: 14.9 % (ref 12.3–15.4)
WBC: 6.9 10*3/uL (ref 3.4–10.8)

## 2017-03-18 LAB — CMP14+EGFR
ALT: 19 IU/L (ref 0–44)
AST: 25 IU/L (ref 0–40)
Albumin/Globulin Ratio: 1.3 (ref 1.2–2.2)
Albumin: 4.4 g/dL (ref 3.5–5.5)
Alkaline Phosphatase: 93 IU/L (ref 39–117)
BUN/Creatinine Ratio: 15 (ref 9–20)
BUN: 26 mg/dL — ABNORMAL HIGH (ref 6–24)
Bilirubin Total: <0.2 mg/dL (ref 0.0–1.2)
CO2: 23 mmol/L (ref 20–29)
Calcium: 9.9 mg/dL (ref 8.7–10.2)
Chloride: 105 mmol/L (ref 96–106)
Creatinine, Ser: 1.75 mg/dL — ABNORMAL HIGH (ref 0.76–1.27)
GFR calc Af Amer: 50 mL/min/{1.73_m2} — ABNORMAL LOW (ref >59)
GFR calc non Af Amer: 43 mL/min/{1.73_m2} — ABNORMAL LOW (ref >59)
Globulin, Total: 3.5 g/dL (ref 1.5–4.5)
Glucose: 97 mg/dL (ref 65–99)
Potassium: 5.3 mmol/L — ABNORMAL HIGH (ref 3.5–5.2)
Sodium: 139 mmol/L (ref 134–144)
Total Protein: 7.9 g/dL (ref 6.0–8.5)

## 2017-03-19 ENCOUNTER — Encounter: Payer: Self-pay | Admitting: Podiatry

## 2017-03-19 ENCOUNTER — Ambulatory Visit: Payer: Medicaid Other | Admitting: Podiatry

## 2017-03-19 VITALS — BP 129/87 | HR 55

## 2017-03-19 DIAGNOSIS — E119 Type 2 diabetes mellitus without complications: Secondary | ICD-10-CM | POA: Diagnosis not present

## 2017-03-19 DIAGNOSIS — L309 Dermatitis, unspecified: Secondary | ICD-10-CM

## 2017-03-19 MED ORDER — FLUOCINONIDE-E 0.05 % EX CREA: 1.0000 "application " | TOPICAL_CREAM | Freq: Two times a day (BID) | CUTANEOUS | 0 refills | Status: DC

## 2017-03-19 MED FILL — traMADol HCL 50 MG TABS: 50 | 20 days supply | Qty: 60 | Fill #0

## 2017-03-19 NOTE — Progress Notes (Signed)
   Subjective:    Patient ID: Willie Walker, male    DOB: 10/12/1965, 52 y.o.   MRN: [id number]  HPIthis patient presents the office with chief complaint of skin lesion on the bottom of his left foot in front of his fifth toe.  He says there is occasional pain at the site of the skin lesion, but usually is not painful.  He says he has no history of trauma or injury to the skin on the bottom of his left foot.  He denies any drainage coming from the skin lesion under the left foot.  This patient is diabetic and he is concerned about any skin changes on his feet.  Therefore, he presents the office today for an evaluation of this skin lesion.    Review of Systems  All other systems reviewed and are negative.      Objective:   Physical Exam General Appearance  Alert, conversant and in no acute stress.  Vascular  Dorsalis pedis and posterior tibial pulses are palpable  bilaterally.  Capillary return is within normal limits  bilaterally. Temperature is within normal limits  Bilaterally.  Neurologic  Senn-Weinstein monofilament wire test within normal limits  bilaterally. Muscle power within normal limits bilaterally.  Nails Normal nails noted with no evidence of bacterial or fungal infection.  Orthopedic  No limitations of motion of motion feet bilaterally.  No crepitus or effusions noted.  No bony pathology or digital deformities noted.  Skin  normotropic skin .  No signs of infections or ulcers note d except for the skin lesion noted on the plantar aspect of his left foot proximal to the fifth toe . There is dry crusty skin noted on the surface with no evidence of any drainage or pus noted.  Upon debridement of the crusty skin damage the  skin appears to be damaged. with the interruption of the skin lines..  This lesion has no purplish discoloration, but does appear to be damaged.          Assessment & Plan:  Dermatitis left foot.  IE.  Prescribed Lidex for patient to apply to his skin  lesion.  Patient is to return to the office in 3 weeks for further evaluation.  There does not appear to be any evidence of verrucous tissue at the site of the skin lesion. ince there is no evidence of any pain, redness or inflammation. I recommended Lidex usage for 3 weeks and he then needs to be reevaluated and possibly a skin biopsy  be performed.   Helane GuntherGregory Kazue Cerro DPM

## 2017-03-26 LAB — POCT ABI - SCREENING FOR PILOT NO CHARGE: Other: NEGATIVE

## 2017-04-07 ENCOUNTER — Telehealth: Payer: Self-pay | Admitting: *Deleted

## 2017-04-07 NOTE — Telephone Encounter (Signed)
-----   Message from Quentin Angstlugbemiga E Jegede, MD sent at 04/06/2017 12:19 PM EDT ----- Kidney function is low but stable over the past year.

## 2017-04-07 NOTE — Telephone Encounter (Signed)
Patient verified DOB Patient is aware of kidney function being stable but low. Patient is aware of level being monitored and patient was transferred to the front desk to schedule next appointment. No further questions.

## 2017-04-09 ENCOUNTER — Ambulatory Visit: Payer: Medicaid Other | Admitting: Podiatry

## 2017-04-13 MED FILL — LISINOPRIL 10 MG TABS: 10 | 30 days supply | Qty: 30 | Fill #3

## 2017-04-13 MED FILL — METOPROLOL TARTRATE 25 MG T: 25 | 30 days supply | Qty: 15 | Fill #1

## 2017-04-13 MED FILL — metFORMIN HCL 500 MG TABS: 500 | 30 days supply | Qty: 60 | Fill #3

## 2017-04-28 ENCOUNTER — Ambulatory Visit: Payer: Medicaid Other | Admitting: Podiatry

## 2017-04-28 ENCOUNTER — Ambulatory Visit: Payer: Medicaid Other | Admitting: Internal Medicine

## 2017-05-06 MED FILL — LISINOPRIL 10 MG TABS: 10 | 30 days supply | Qty: 30 | Fill #4

## 2017-05-06 MED FILL — metFORMIN HCL 500 MG TABS: 500 | 30 days supply | Qty: 60 | Fill #4

## 2017-05-06 MED FILL — METOPROLOL TARTRATE 25 MG T: 25 | 30 days supply | Qty: 15 | Fill #2

## 2017-05-10 ENCOUNTER — Ambulatory Visit: Payer: Medicaid Other | Admitting: Podiatry

## 2017-05-10 ENCOUNTER — Encounter: Payer: Self-pay | Admitting: Podiatry

## 2017-05-10 DIAGNOSIS — E119 Type 2 diabetes mellitus without complications: Secondary | ICD-10-CM | POA: Diagnosis not present

## 2017-05-10 DIAGNOSIS — L989 Disorder of the skin and subcutaneous tissue, unspecified: Secondary | ICD-10-CM

## 2017-05-10 NOTE — Progress Notes (Signed)
This patient returns to the office 7 weeks after diagnosing him as having a dermatitis, sub-fifth metatarsal head left foot... He presented with a crusty skin damage with the interruption of the skin lines on the left forefoot.   Patient is diabetic.  Marland Kitchen. He says he has not had any pain or discomfort in his left foot for the 7 weeks from his initial examination.  Marland Kitchen. He says that last Friday. The pain became painful on the outside of his left foot but not at the site of the skin lesion.  Marland Kitchen. He presents the office today stating that pain from last Friday has gone away and heis  experiencing no pain or discomfort when he walks.  Marland Kitchen. He presents the office today for continued evaluation and treatment of this skin lesion.  General Appearance  Alert, conversant and in no acute stress.  Vascular  Dorsalis pedis and posterior tibial  pulses are palpable  bilaterally.  Capillary return is within normal limits  bilaterally. Temperature is within normal limits  bilaterally.  Neurologic  Senn-Weinstein monofilament wire test within normal limits  bilaterally. Muscle power within normal limits bilaterally.  Nails Normal nails noted with no fungal or bacterial infection.  Orthopedic  No limitations of motion both  feet .  No crepitus or effusions noted.  No bony pathology or digital deformities noted.  HAV  B/L.  Skin  . Examination of the left forefoot reveals 3 dark skin lesions noted distal to the fifth metatarsal head left foot.  No evidence of any redness, swelling or fluctuance or pain.  The crust  that was present 6 weeks ago has resolved .   Dermatitis  Skin lesion left foot.    ROV.  Marland Kitchen. Examination of the foot reveals skin that is darkened and damaged.  Patient denies any foreign body or injury to this site.  He also admits he never picked up his medication that was prescribed last visit for application to this site.  He was told to return to the office when necessary   Helane GuntherGregory Jozlynn Plaia DPM

## 2017-05-18 ENCOUNTER — Ambulatory Visit: Payer: Medicaid Other | Attending: Family Medicine | Admitting: Family Medicine

## 2017-05-18 ENCOUNTER — Encounter: Payer: Self-pay | Admitting: Family Medicine

## 2017-05-18 VITALS — BP 118/77 | HR 66 | Temp 97.6°F | Ht 71.0 in | Wt 171.8 lb

## 2017-05-18 DIAGNOSIS — E7800 Pure hypercholesterolemia, unspecified: Secondary | ICD-10-CM | POA: Diagnosis not present

## 2017-05-18 DIAGNOSIS — Z87442 Personal history of urinary calculi: Secondary | ICD-10-CM | POA: Insufficient documentation

## 2017-05-18 DIAGNOSIS — Z955 Presence of coronary angioplasty implant and graft: Secondary | ICD-10-CM | POA: Diagnosis not present

## 2017-05-18 DIAGNOSIS — Z1159 Encounter for screening for other viral diseases: Secondary | ICD-10-CM | POA: Diagnosis not present

## 2017-05-18 DIAGNOSIS — I252 Old myocardial infarction: Secondary | ICD-10-CM | POA: Diagnosis not present

## 2017-05-18 DIAGNOSIS — Z79899 Other long term (current) drug therapy: Secondary | ICD-10-CM | POA: Insufficient documentation

## 2017-05-18 DIAGNOSIS — I10 Essential (primary) hypertension: Secondary | ICD-10-CM | POA: Insufficient documentation

## 2017-05-18 DIAGNOSIS — R200 Anesthesia of skin: Secondary | ICD-10-CM | POA: Diagnosis not present

## 2017-05-18 DIAGNOSIS — Z7982 Long term (current) use of aspirin: Secondary | ICD-10-CM | POA: Insufficient documentation

## 2017-05-18 DIAGNOSIS — E1165 Type 2 diabetes mellitus with hyperglycemia: Secondary | ICD-10-CM

## 2017-05-18 DIAGNOSIS — M542 Cervicalgia: Secondary | ICD-10-CM | POA: Diagnosis not present

## 2017-05-18 DIAGNOSIS — Z888 Allergy status to other drugs, medicaments and biological substances status: Secondary | ICD-10-CM | POA: Diagnosis not present

## 2017-05-18 DIAGNOSIS — E1169 Type 2 diabetes mellitus with other specified complication: Secondary | ICD-10-CM | POA: Diagnosis not present

## 2017-05-18 DIAGNOSIS — Z7984 Long term (current) use of oral hypoglycemic drugs: Secondary | ICD-10-CM | POA: Diagnosis not present

## 2017-05-18 DIAGNOSIS — G8929 Other chronic pain: Secondary | ICD-10-CM | POA: Insufficient documentation

## 2017-05-18 DIAGNOSIS — E119 Type 2 diabetes mellitus without complications: Secondary | ICD-10-CM | POA: Diagnosis present

## 2017-05-18 DIAGNOSIS — K219 Gastro-esophageal reflux disease without esophagitis: Secondary | ICD-10-CM | POA: Insufficient documentation

## 2017-05-18 DIAGNOSIS — E1149 Type 2 diabetes mellitus with other diabetic neurological complication: Secondary | ICD-10-CM | POA: Insufficient documentation

## 2017-05-18 DIAGNOSIS — Z8701 Personal history of pneumonia (recurrent): Secondary | ICD-10-CM | POA: Insufficient documentation

## 2017-05-18 DIAGNOSIS — I2510 Atherosclerotic heart disease of native coronary artery without angina pectoris: Secondary | ICD-10-CM | POA: Diagnosis not present

## 2017-05-18 DIAGNOSIS — E1140 Type 2 diabetes mellitus with diabetic neuropathy, unspecified: Secondary | ICD-10-CM | POA: Insufficient documentation

## 2017-05-18 DIAGNOSIS — M109 Gout, unspecified: Secondary | ICD-10-CM | POA: Diagnosis not present

## 2017-05-18 DIAGNOSIS — Z1211 Encounter for screening for malignant neoplasm of colon: Secondary | ICD-10-CM

## 2017-05-18 DIAGNOSIS — Z951 Presence of aortocoronary bypass graft: Secondary | ICD-10-CM | POA: Insufficient documentation

## 2017-05-18 DIAGNOSIS — F419 Anxiety disorder, unspecified: Secondary | ICD-10-CM | POA: Insufficient documentation

## 2017-05-18 DIAGNOSIS — E1151 Type 2 diabetes mellitus with diabetic peripheral angiopathy without gangrene: Secondary | ICD-10-CM | POA: Diagnosis not present

## 2017-05-18 LAB — GLUCOSE, POCT (MANUAL RESULT ENTRY): POC Glucose: 133 mg/dl — AB (ref 70–99)

## 2017-05-18 LAB — POCT GLYCOSYLATED HEMOGLOBIN (HGB A1C): Hemoglobin A1C: 6.2

## 2017-05-18 MED ORDER — METFORMIN HCL 500 MG PO TABS: 500.0000 mg | ORAL_TABLET | Freq: Two times a day (BID) | ORAL | 1 refills | Status: DC

## 2017-05-18 MED ORDER — GABAPENTIN 100 MG PO CAPS: 100.0000 mg | ORAL_CAPSULE | Freq: Three times a day (TID) | ORAL | 6 refills | Status: DC

## 2017-05-18 MED ORDER — CYCLOBENZAPRINE HCL 10 MG PO TABS: 10.0000 mg | ORAL_TABLET | Freq: Two times a day (BID) | ORAL | 1 refills | Status: DC | PRN

## 2017-05-18 MED ORDER — METOPROLOL TARTRATE 25 MG PO TABS: 12.5000 mg | ORAL_TABLET | Freq: Every day | ORAL | 1 refills | Status: DC

## 2017-05-18 MED ORDER — LISINOPRIL 10 MG PO TABS: 10.0000 mg | ORAL_TABLET | Freq: Every day | ORAL | 1 refills | Status: DC

## 2017-05-18 MED FILL — CYCLOBENZAPRINE 10 MG TAB: 10 | 30 days supply | Qty: 60 | Fill #0

## 2017-05-18 MED FILL — GABAPENTIN 100 MG CAPSULE: 100 | 30 days supply | Qty: 90 | Fill #0

## 2017-05-18 NOTE — Patient Instructions (Signed)

## 2017-05-18 NOTE — Progress Notes (Signed)
Subjective:  Patient ID: Willie Walker, male    DOB: [birth year]  Age: 52 y.o. MRN: [id number]  CC: Diabetes and Establish Care   HPI Willie Walker is a 52 year old male with a history of type 2 diabetes mellitus (A1c 6.2), hypertension, diabetic neuropathy, CAD status post CABG, who presents to the clinic to establish care with me. He complains of chronic left-sided neck pain and is unable to give a specific duration but thinks he has had it ever since he had his CABG 2 years ago.  Pain is described as dull sometimes and at other times sharp especially when he turns his neck.  Pain radiates to his left shoulder.  Denies weakness in his upper extremities or numbness and informs me of a previous history of left shoulder cortisone injection but he denies shoulder pain at this time. With regards to his diabetes mellitus he has been compliant with his medications and denies visual concerns or hypoglycemia however he endorses neuropathy in his toes and review of his medications indicates he was prescribed gabapentin which he has not been taking. He is compliant with his antihypertensive and denies adverse effects. Denies chest pains, shortness of breath. He is not up-to-date on a colonoscopy and is willing to be referred today.    Past Medical History:  Diagnosis Date  . Anginal pain (HCC)   . Anxiety   . Childhood asthma   . Chronic lower back pain   . Coronary artery disease   . ELECTROCARDIOGRAM, ABNORMAL   . GERD (gastroesophageal reflux disease)   . Gout   . Headache(784.0) 09/18/2011   "maybe twice/wk; pressure on the brain"  . High cholesterol   . Hypertension   . Kidney stones   . Lower GI bleed 09/18/2011   "clots and everything; not lately"  . Myocardial infarction (HCC) 10/2005  . Peripheral vascular disease (HCC)    LLE  . Pneumonia 1990's  . Seizures (HCC) 09/18/2011   "blanks out on me"/wife's report  . Shortness of breath 09/18/11   "@ rest; lying down; w/exertion"    . Type I diabetes mellitus (HCC)     Past Surgical History:  Procedure Laterality Date  . BURR HOLE OF CRANIUM  1999   "mugged"  . CARDIAC CATHETERIZATION N/A 08/01/2015   Procedure: Left Heart Cath and Coronary Angiography;  Surgeon: Peter M Swaziland, MD;  Location: [facility]VASIVE CV LAB;  Service: Cardiovascular;  Laterality: N/A;  . CORONARY ANGIOPLASTY  09/18/2011  . CORONARY ANGIOPLASTY WITH STENT PLACEMENT  10/2005   "9"  . CORONARY ARTERY BYPASS GRAFT N/A 08/05/2015   Procedure: CORONARY ARTERY BYPASS GRAFTING (CABG), ON PUMP, TIMES FIVE, USING LEFT INTERNAL MAMMARY ARTERY AND LEFT RADIAL ARTERY, RIGHT GREATER SAPHENOUS VEIN HARVESTED ENDOSCOPICALLY;  Surgeon: Loreli Slot, MD;  Location: [facility] OR;  Service: Open Heart Surgery;  Laterality: N/A;  . LACERATION REPAIR  1999   BLE "mugging"  . PERCUTANEOUS CORONARY STENT INTERVENTION (PCI-S) N/A 09/18/2011   Procedure: PERCUTANEOUS CORONARY STENT INTERVENTION (PCI-S);  Surgeon: Peter M Swaziland, MD;  Location: [facility] CATH LAB;  Service: Cardiovascular;  Laterality: N/A;  . RADIAL ARTERY HARVEST Left 08/05/2015   Procedure: RADIAL ARTERY HARVEST;  Surgeon: Loreli Slot, MD;  Location: [facility] OR;  Service: Open Heart Surgery;  Laterality: Left;  . TEE WITHOUT CARDIOVERSION N/A 08/05/2015   Procedure: TRANSESOPHAGEAL ECHOCARDIOGRAM (TEE);  Surgeon: Loreli Slot, MD;  Location: [facility] OR;  Service: Open Heart Surgery;  Laterality: N/A;  Allergies  Allergen Reactions  . Crestor [Rosuvastatin] Other (See Comments)    "Makes my legs hurt" - tolerates simvastatin      Outpatient Medications Prior to Visit  Medication Sig Dispense Refill  . aspirin 325 MG EC tablet Take 1 tablet (325 mg total) by mouth daily. 60 tablet 3  . Colchicine 0.6 MG CAPS Take 1 capsule by mouth daily. 14 capsule 0  . fish oil-omega-3 fatty acids 1000 MG capsule Take 1 g by mouth 2 (two) times daily.    . traMADol (ULTRAM) 50 MG tablet Take 1 tablet (50 mg  total) by mouth every 8 (eight) hours as needed. 60 tablet 0  . lisinopril (PRINIVIL,ZESTRIL) 10 MG tablet Take 1 tablet (10 mg total) by mouth daily. 90 tablet 3  . metFORMIN (GLUCOPHAGE) 500 MG tablet Take 1 tablet (500 mg total) by mouth 2 (two) times daily with a meal. 180 tablet 3  . metoprolol tartrate (LOPRESSOR) 25 MG tablet Take 0.5 tablets (12.5 mg total) by mouth daily. 90 tablet 3  . acetaminophen-codeine (TYLENOL #3) 300-30 MG tablet Take 1 tablet by mouth every 4 (four) hours as needed. (Patient not taking: Reported on 05/18/2017) 60 tablet 0  . allopurinol (ZYLOPRIM) 100 MG tablet Take 1 tablet (100 mg total) by mouth daily. (Patient not taking: Reported on 05/18/2017) 30 tablet 6  . fluocinonide-emollient (LIDEX-E) 0.05 % cream Apply 1 application topically 2 (two) times daily. (Patient not taking: Reported on 05/18/2017) 30 g 0  . ramelteon (ROZEREM) 8 MG tablet Take 1 tablet (8 mg total) by mouth at bedtime. (Patient not taking: Reported on 05/18/2017) 30 tablet 0  . gabapentin (NEURONTIN) 100 MG capsule Take 1 capsule (100 mg total) by mouth 3 (three) times daily. (Patient not taking: Reported on 05/18/2017) 90 capsule 3   No facility-administered medications prior to visit.     ROS Review of Systems  Constitutional: Negative for activity change and appetite change.  HENT: Negative for sinus pressure and sore throat.   Eyes: Negative for visual disturbance.  Respiratory: Negative for cough, chest tightness and shortness of breath.   Cardiovascular: Negative for chest pain and leg swelling.  Gastrointestinal: Negative for abdominal distention, abdominal pain, constipation and diarrhea.  Endocrine: Negative.   Genitourinary: Negative for dysuria.  Musculoskeletal: Positive for neck pain. Negative for joint swelling and myalgias.  Skin: Negative for rash.  Allergic/Immunologic: Negative.   Neurological: Positive for numbness. Negative for weakness and light-headedness.    Psychiatric/Behavioral: Negative for dysphoric mood and suicidal ideas.    Objective:  BP 118/77   Pulse 66   Temp 97.6 F (36.4 C) (Oral)   Ht  (1.803 m)   Wt 171 lb 12.8 oz (77.9 kg)   SpO2 100%   BMI 23.96 kg/m   BP/Weight 05/18/2017 03/19/2017 03/17/2017  Systolic BP 118 129 120  Diastolic BP 77 87 82  Wt. (Lbs) 171.8 - 179  BMI 23.96 - 24.97      Physical Exam  Constitutional: He is oriented to person, place, and time. He appears well-developed and well-nourished.  Neck: Normal range of motion.  Tenderness in left side of neck on lateral rotation  Cardiovascular: Normal rate, normal heart sounds and intact distal pulses.  No murmur heard. Pulmonary/Chest: Effort normal and breath sounds normal. He has no wheezes. He has no rales. He exhibits no tenderness.  Abdominal: Soft. Bowel sounds are normal. He exhibits no distension and no mass. There is no tenderness.  Musculoskeletal: Normal  range of motion.  Neurological: He is alert and oriented to person, place, and time.  Skin: Skin is warm and dry.  Psychiatric: He has a normal mood and affect.     CMP Latest Ref Rng & Units 03/17/2017 01/27/2017 04/22/2016  Glucose 65 - 99 mg/dL 97 811(B) 147(W)  BUN 6 - 24 mg/dL 29(F) 23 23  Creatinine 0.76 - 1.27 mg/dL 6.21(H) 0.86(V) 7.84(O)  Sodium 134 - 144 mmol/L 139 140 140  Potassium 3.5 - 5.2 mmol/L 5.3(H) 5.1 4.5  Chloride 96 - 106 mmol/L 105 103 104  CO2 20 - 29 mmol/L Calcium 8.7 - 10.2 mg/dL 9.9 9.7 9.9  Total Protein 6.0 - 8.5 g/dL 7.9 8.0 7.4  Total Bilirubin 0.0 - 1.2 mg/dL <9.6 0.2 0.3  Alkaline Phos 39 - 117 IU/L 93 89 93  AST 0 - 40 IU/L ALT 0 - 44 IU/L Lab Results  Component Value Date   TSH 1.170 01/27/2017     Lab Results  Component Value Date   TSH 1.170 01/27/2017    Assessment & Plan:   1. Type 2 diabetes mellitus with other neurologic complication, without long-term current use of insulin (HCC) Controlled  with A1c of 6.2 Has not been taking gabapentin which I have refilled Up-to-date on foot exam and eye exam Counseled on Diabetic diet, my plate method, 295 minutes of moderate intensity exercise/week Keep blood sugar logs with fasting goals of 80-120 mg/dl, random of less than 284 and in the event of sugars less than 60 mg/dl or greater than 132 mg/dl please notify the clinic ASAP. It is recommended that you undergo annual eye exams and annual foot exams. Pneumonia vaccine is recommended. - POCT glucose (manual entry) - POCT glycosylated hemoglobin (Hb A1C) - Lipid panel; Future - Microalbumin/Creatinine Ratio, Urine; Future - gabapentin (NEURONTIN) 100 MG capsule; Take 1 capsule (100 mg total) by mouth 3 (three) times daily.  Dispense: 90 capsule; Refill: 6  2. Essential hypertension, benign Controlled Counseled on Diabetic diet, my plate method, 440 minutes of moderate intensity exercise/week Keep blood sugar logs with fasting goals of 80-120 mg/dl, random of less than 102 and in the event of sugars less than 60 mg/dl or greater than 725 mg/dl please notify the clinic ASAP. It is recommended that you undergo annual eye exams and annual foot exams. Pneumonia vaccine is recommended. - lisinopril (PRINIVIL,ZESTRIL) 10 MG tablet; Take 1 tablet (10 mg total) by mouth daily.  Dispense: 90 tablet; Refill: 1 - metoprolol tartrate (LOPRESSOR) 25 MG tablet; Take 0.5 tablets (12.5 mg total) by mouth daily.  Dispense: 90 tablet; Refill: 1  3. Type 2 diabetes mellitus with other specified complication, without long-term current use of insulin (HCC) Controlled with A1c of 6.2 Continue diabetic diet - metFORMIN (GLUCOPHAGE) 500 MG tablet; Take 1 tablet (500 mg total) by mouth 2 (two) times daily with a meal.  Dispense: 180 tablet; Refill: 1  4. Neck pain Chronic -likely of muscular etiology  Advised to apply heat - cyclobenzaprine (FLEXERIL) 10 MG tablet; Take 1 tablet (10 mg total) by mouth 2 (two)  times daily as needed for muscle spasms.  Dispense: 60 tablet; Refill: 1  5. Screening for colon cancer - Ambulatory referral to Gastroenterology  6. Need for hepatitis C screening test - Hepatitis c antibody (reflex); Future   Meds ordered this encounter  Medications  . lisinopril (PRINIVIL,ZESTRIL) 10 MG tablet  Sig: Take 1 tablet (10 mg total) by mouth daily.    Dispense:  90 tablet    Refill:  1  . metFORMIN (GLUCOPHAGE) 500 MG tablet    Sig: Take 1 tablet (500 mg total) by mouth 2 (two) times daily with a meal.    Dispense:  180 tablet    Refill:  1  . metoprolol tartrate (LOPRESSOR) 25 MG tablet    Sig: Take 0.5 tablets (12.5 mg total) by mouth daily.    Dispense:  90 tablet    Refill:  1  . cyclobenzaprine (FLEXERIL) 10 MG tablet    Sig: Take 1 tablet (10 mg total) by mouth 2 (two) times daily as needed for muscle spasms.    Dispense:  60 tablet    Refill:  1  . gabapentin (NEURONTIN) 100 MG capsule    Sig: Take 1 capsule (100 mg total) by mouth 3 (three) times daily.    Dispense:  90 capsule    Refill:  6    Follow-up: Return in about 6 months (around 11/17/2017) for follow Up of chronic medical conditions.   Hoy Register MD

## 2017-05-19 ENCOUNTER — Ambulatory Visit: Payer: Medicaid Other | Attending: Family Medicine

## 2017-05-19 ENCOUNTER — Ambulatory Visit: Payer: Medicaid Other | Admitting: Podiatry

## 2017-05-19 DIAGNOSIS — E1149 Type 2 diabetes mellitus with other diabetic neurological complication: Secondary | ICD-10-CM | POA: Insufficient documentation

## 2017-05-19 DIAGNOSIS — Z1159 Encounter for screening for other viral diseases: Secondary | ICD-10-CM

## 2017-05-19 NOTE — Progress Notes (Signed)
Patient here for lab visit only 

## 2017-05-20 ENCOUNTER — Other Ambulatory Visit: Payer: Self-pay | Admitting: Family Medicine

## 2017-05-20 LAB — LIPID PANEL
Chol/HDL Ratio: 5.7 ratio — ABNORMAL HIGH (ref 0.0–5.0)
Cholesterol, Total: 269 mg/dL — ABNORMAL HIGH (ref 100–199)
HDL: 47 mg/dL (ref >39)
LDL Calculated: 182 mg/dL — ABNORMAL HIGH (ref 0–99)
Triglycerides: 200 mg/dL — ABNORMAL HIGH (ref 0–149)
VLDL CHOLESTEROL CAL: 40 mg/dL (ref 5–40)

## 2017-05-20 LAB — HEPATITIS C ANTIBODY (REFLEX): HCV Ab: <0.1 s/co ratio (ref 0.0–0.9)

## 2017-05-20 LAB — MICROALBUMIN / CREATININE URINE RATIO
Creatinine, Urine: 126.5 mg/dL
MICROALB/CREAT RATIO: 23.5 mg/g{creat} (ref 0.0–30.0)
Microalbumin, Urine: 29.7 ug/mL

## 2017-05-20 LAB — HCV COMMENT:

## 2017-05-20 MED ORDER — SIMVASTATIN 20 MG PO TABS: 20.0000 mg | ORAL_TABLET | Freq: Every day | ORAL | 3 refills | Status: DC

## 2017-05-21 MED FILL — SIMVASTATIN 20 MG TABLET: 20 | 30 days supply | Qty: 30 | Fill #0

## 2017-05-27 ENCOUNTER — Telehealth: Payer: Self-pay

## 2017-05-27 NOTE — Telephone Encounter (Signed)
Patient was called and informed of lab results. 

## 2017-06-03 ENCOUNTER — Telehealth: Payer: Self-pay

## 2017-06-03 NOTE — Telephone Encounter (Signed)
Patient was called and voicemail is full.   If patient returns phone call please inform patient of lab results below.

## 2017-06-03 NOTE — Telephone Encounter (Signed)
-----   Message from Hoy Register, MD sent at 05/20/2017  4:25 PM EDT ----- Labs reveal elevated cholesterol and I have sent a prescription for Simvastatin to the pharmacy. Advised to work on low cholesterol diet and exercise.

## 2017-06-04 ENCOUNTER — Encounter: Payer: Self-pay | Admitting: Family Medicine

## 2017-06-04 LAB — HM DIABETES EYE EXAM

## 2017-06-04 MED FILL — METOPROLOL TARTRATE 25 MG T: 25 | 30 days supply | Qty: 15 | Fill #3

## 2017-06-04 MED FILL — metFORMIN HCL 500 MG TABS: 500 | 30 days supply | Qty: 60 | Fill #5

## 2017-06-04 MED FILL — LISINOPRIL 10 MG TABS: 10 | 30 days supply | Qty: 30 | Fill #5

## 2017-06-16 ENCOUNTER — Ambulatory Visit: Payer: Medicaid Other | Admitting: Family Medicine

## 2017-07-07 MED FILL — METOPROLOL TARTRATE 25 MG T: 25 | 30 days supply | Qty: 15 | Fill #4

## 2017-07-07 MED FILL — LISINOPRIL 10 MG TABS: 10 | 30 days supply | Qty: 30 | Fill #6

## 2017-07-07 MED FILL — metFORMIN HCL 500 MG TABS: 500 | 30 days supply | Qty: 60 | Fill #6

## 2017-08-04 IMAGING — DX DG CHEST 2V
2 series · 2 of 2 positions shown · non-contrast
Comparison: 03/01/2012

CLINICAL DATA: Chest pain

EXAM:
CHEST  2 VIEW

[chest pa]
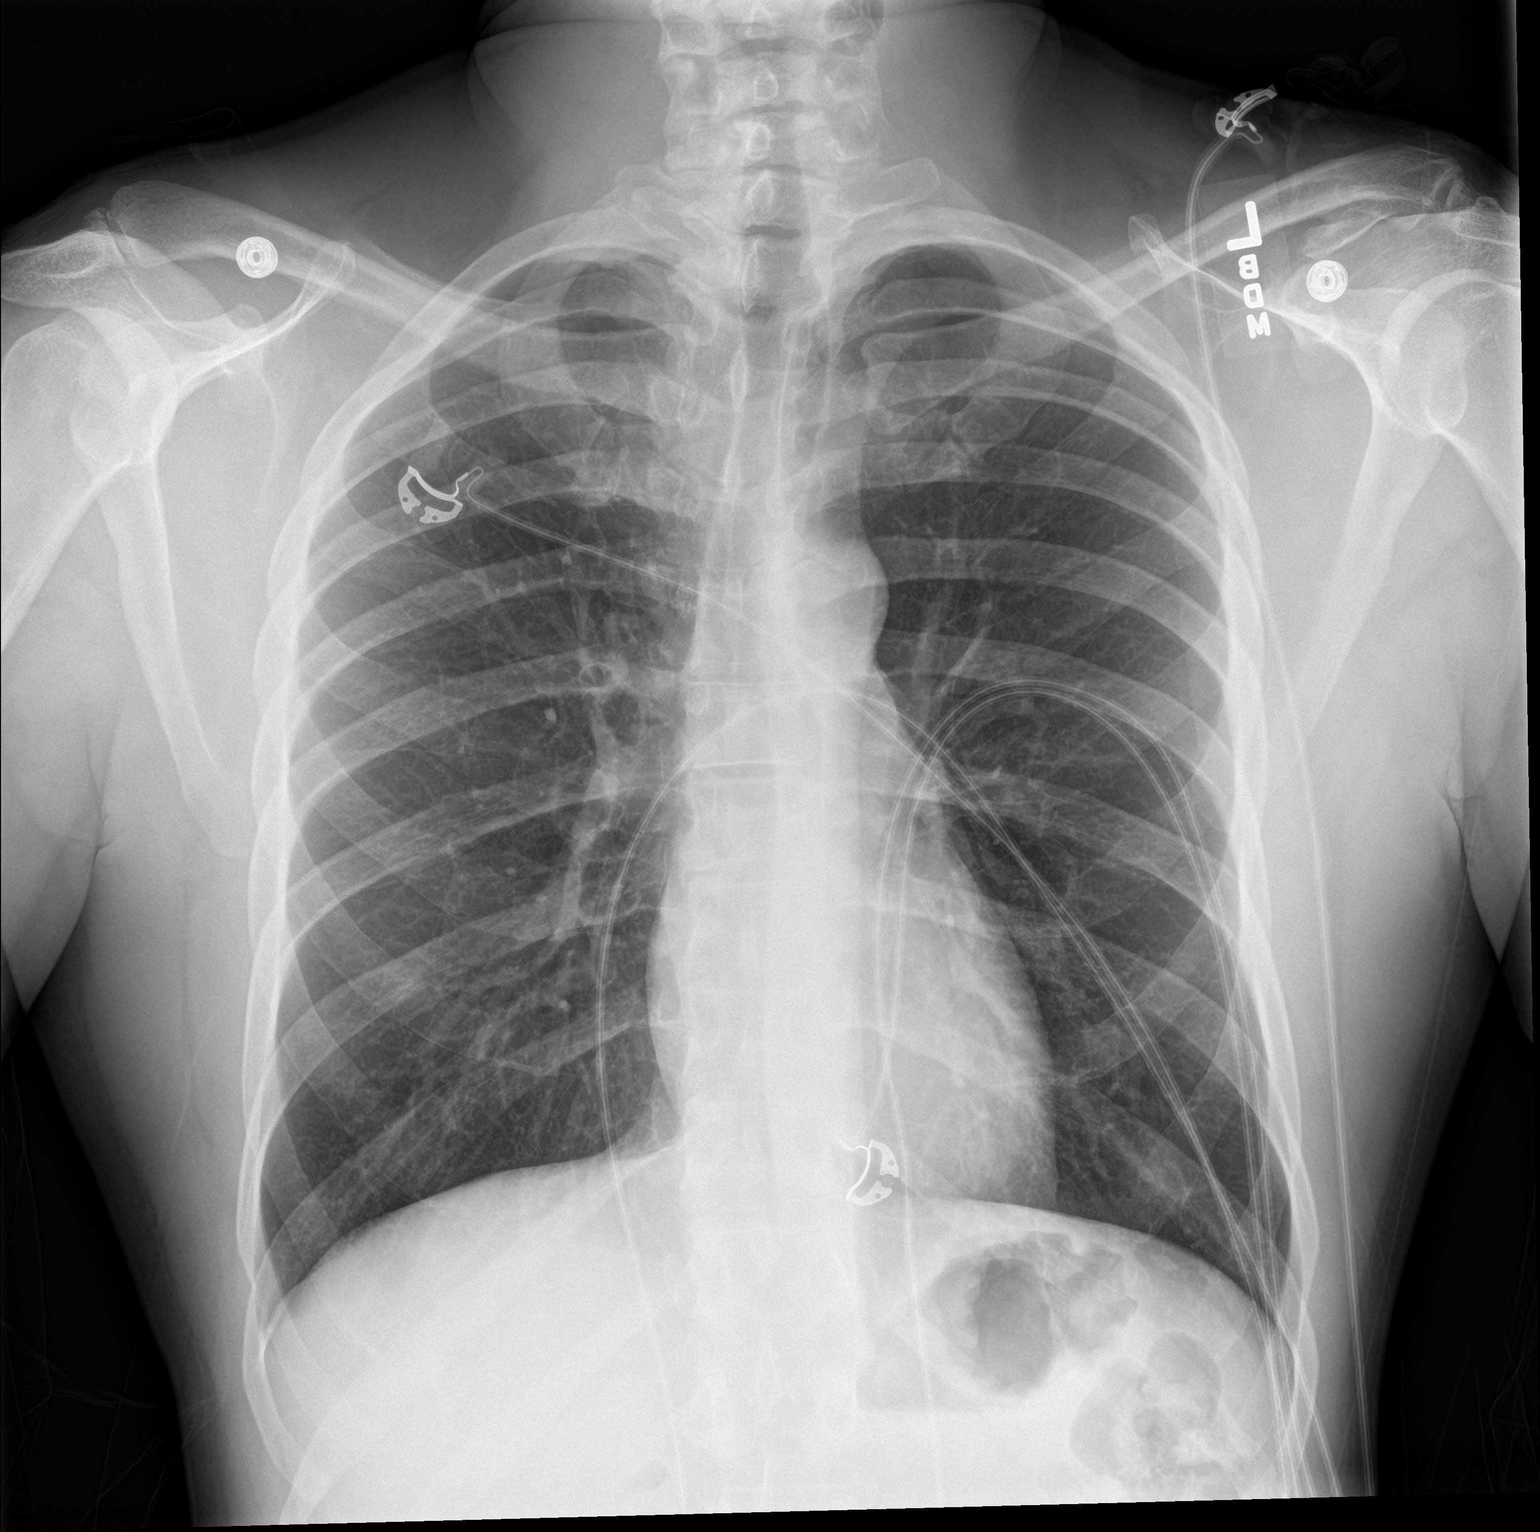

[chest lat]
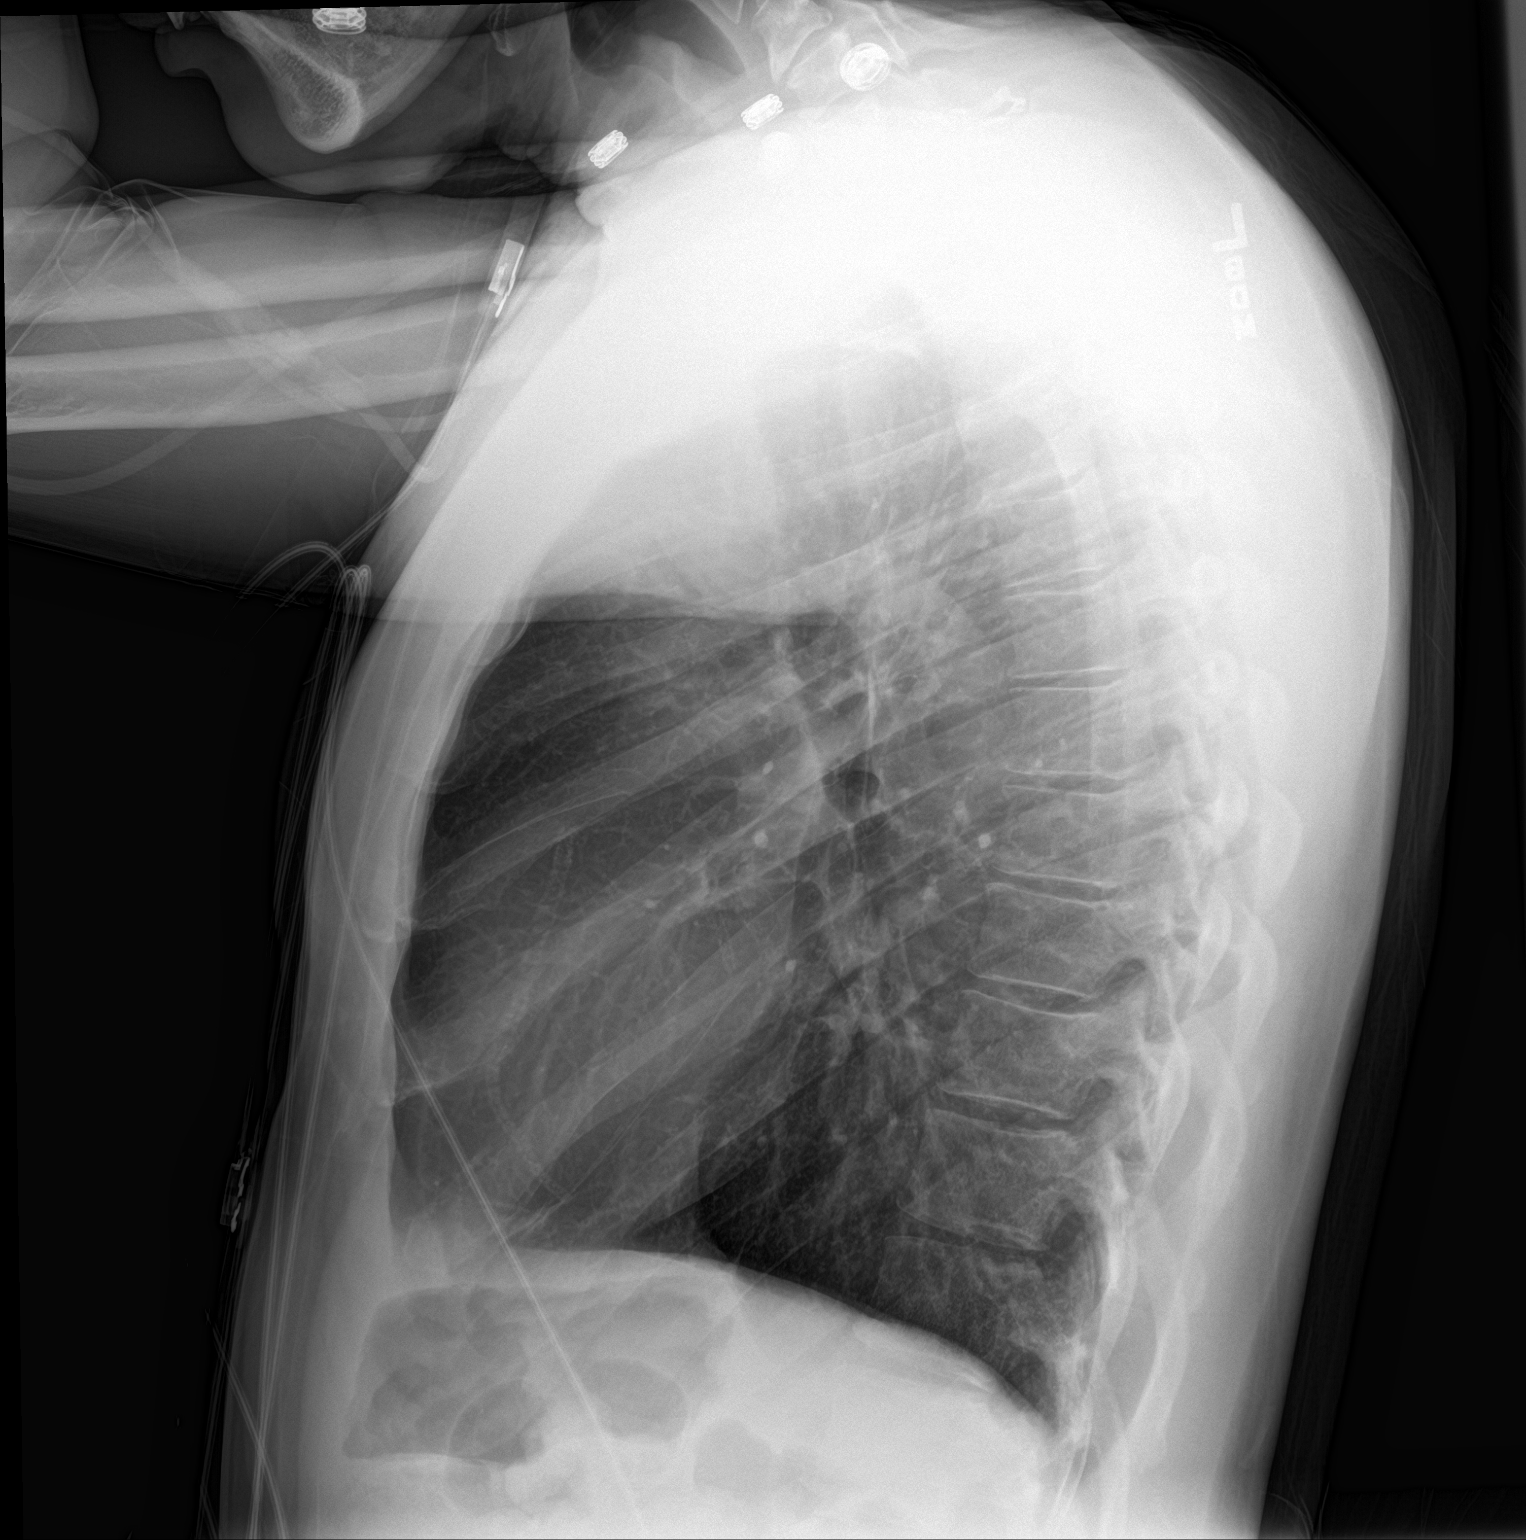

[2 of 2 positions shown; findings below may reference images not displayed]

FINDINGS: The heart size and mediastinal contours are within normal limits.
Coronary stenting is again noted. Both lungs are clear. The
visualized skeletal structures are unremarkable.
IMPRESSION: No active cardiopulmonary disease.

## 2017-08-06 MED FILL — METOPROLOL TARTRATE 25 MG T: 25 | 30 days supply | Qty: 15 | Fill #5

## 2017-08-06 MED FILL — LISINOPRIL 10 MG TABS: 10 | 30 days supply | Qty: 30 | Fill #7

## 2017-08-06 MED FILL — metFORMIN HCL 500 MG TABS: 500 | 30 days supply | Qty: 60 | Fill #7

## 2017-08-07 IMAGING — CR DG CHEST 1V PORT
1 series · 1 of 1 positions shown · non-contrast
Comparison: 08/02/2015

CLINICAL DATA: Bypass surgery.

EXAM:
PORTABLE CHEST 1 VIEW

[AP]
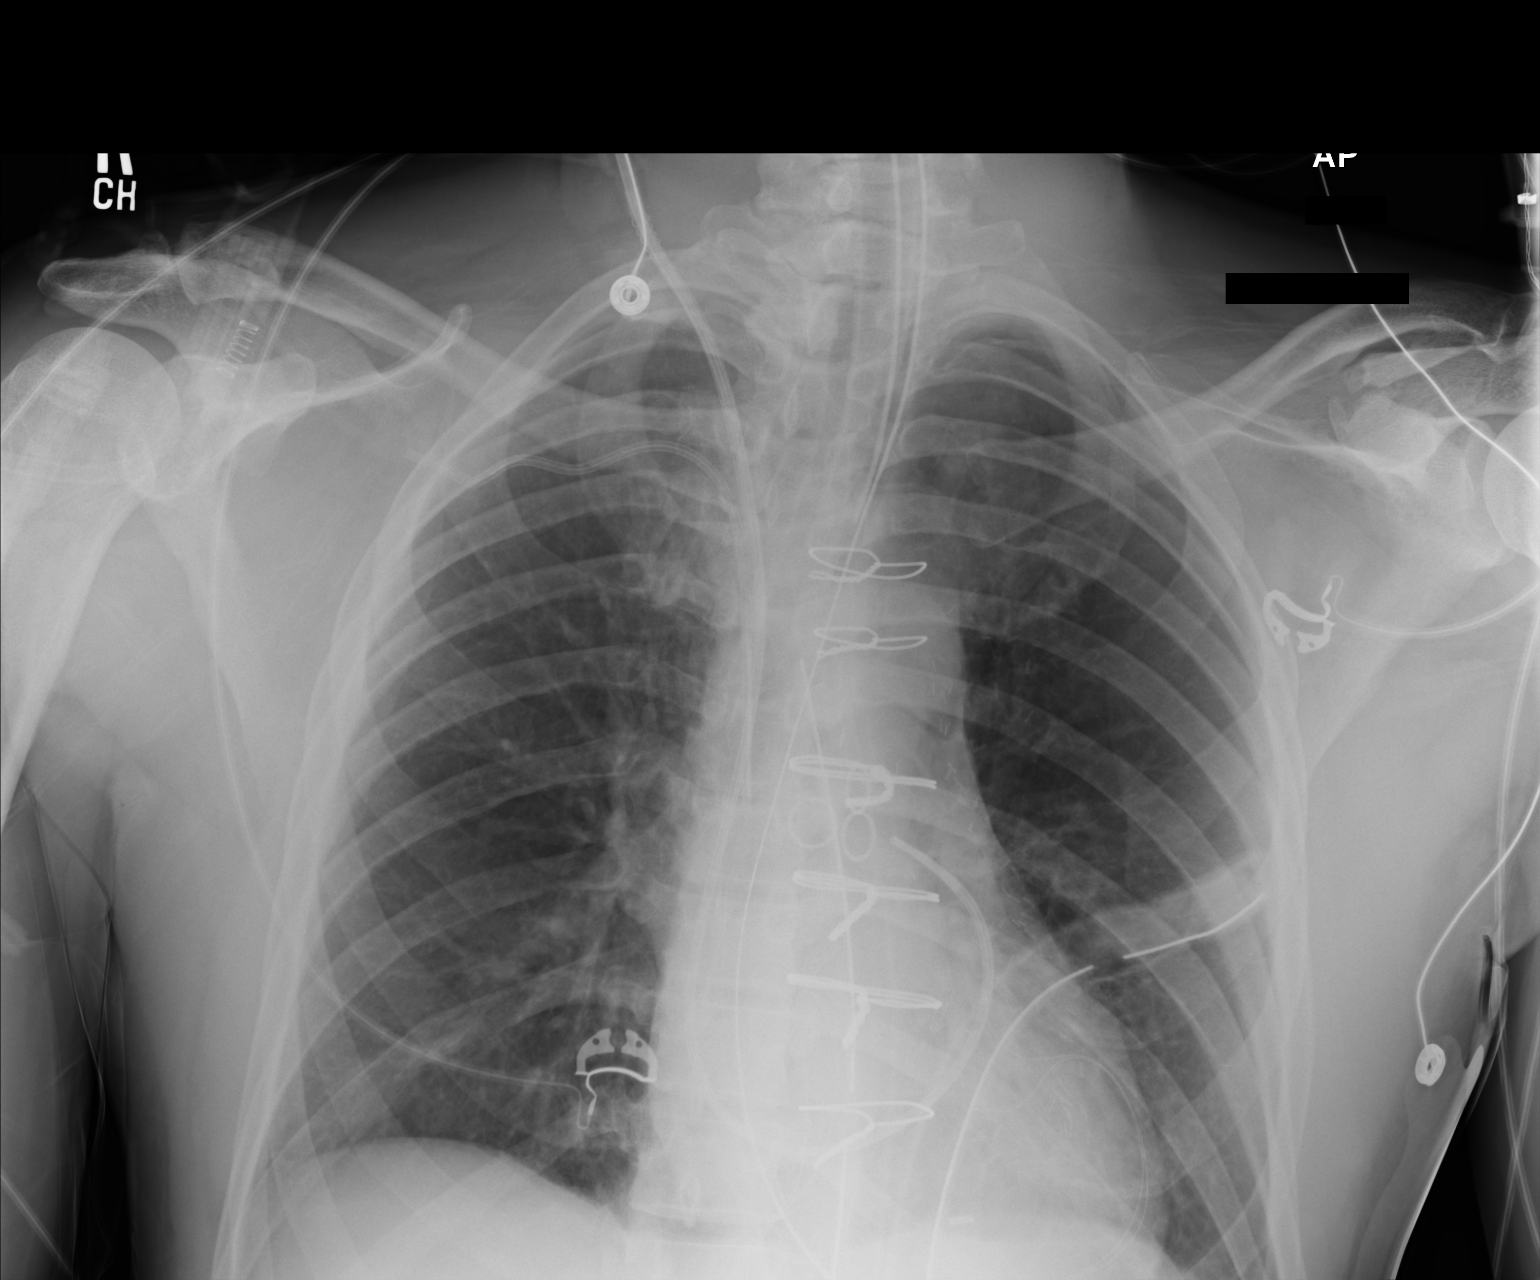

[1 of 1 positions shown; findings below may reference images not displayed]

FINDINGS: The endotracheal tube is 6 cm above the carina. The right IJ
Swan-Ganz catheter tip is in the proximal right pulmonary artery.
The right subclavian center venous catheter tip is in the mid distal
SVC. The NG tube is coursing down into the stomach. The left-sided
chest tube is in good position. No pneumothorax.

The cardiac silhouette, mediastinal and hilar contours are within
normal limits. Stable surgical changes from triple bypass surgery.
No edema or effusions. Minimal streaky areas of atelectasis.
IMPRESSION: Postoperative support apparatus in good position without
complicating features.

No acute pulmonary findings.  No pneumothorax.

## 2017-08-08 IMAGING — CR DG CHEST 1V PORT
1 series · 1 of 1 positions shown · non-contrast
Comparison: 08/05/2015.

CLINICAL DATA: Chest tube.  CABG.

EXAM:
PORTABLE CHEST 1 VIEW

[AP]
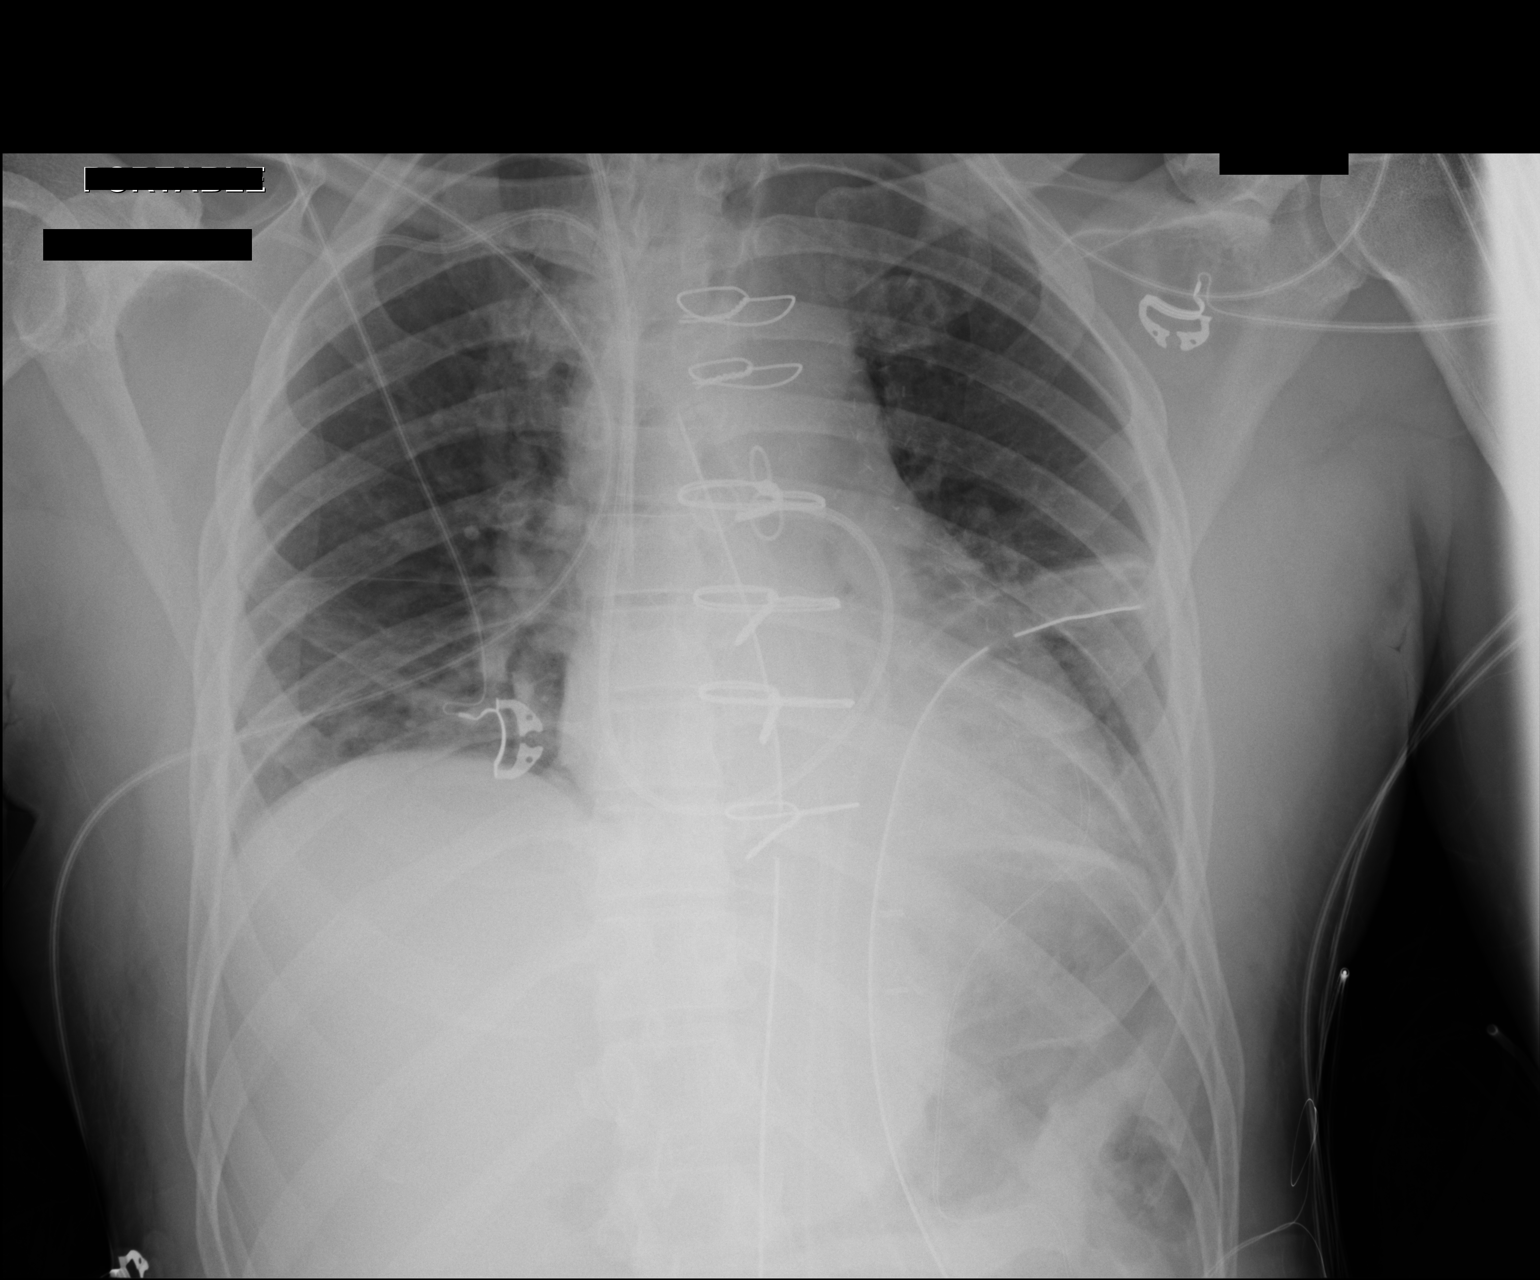

[1 of 1 positions shown; findings below may reference images not displayed]

FINDINGS: Interim extubation removal of NG tube. Swan-Ganz catheter,
mediastinal drainage catheter, left chest tube, right IJ and right
subclavian lines in stable position. Prior CABG. Stable
cardiomegaly. Low lung volumes with mild bibasilar atelectasis
and/or infiltrates. No pleural effusion. Tiny left apical
pneumothorax.
IMPRESSION: 1. Interim extubation and removal of NG tube. Remaining lines and
tubes including left chest tube in stable position. Tiny left apical
pneumothorax noted.
2. Prior CABG.  Stable cardiomegaly.
3. Low lung volumes with mild bibasilar atelectasis and/or
infiltrates.
Critical Value/emergent results were called by telephone at the time
of interpretation on 08/06/2015 at [DATE] to nurse Perola , who
verbally acknowledged these results.

## 2017-08-09 IMAGING — CR DG CHEST 1V PORT
1 series · 1 of 1 positions shown · non-contrast
Comparison: 08/06/2015.

CLINICAL DATA: Chest soreness.  CABG.

EXAM:
PORTABLE CHEST 1 VIEW

[AP]
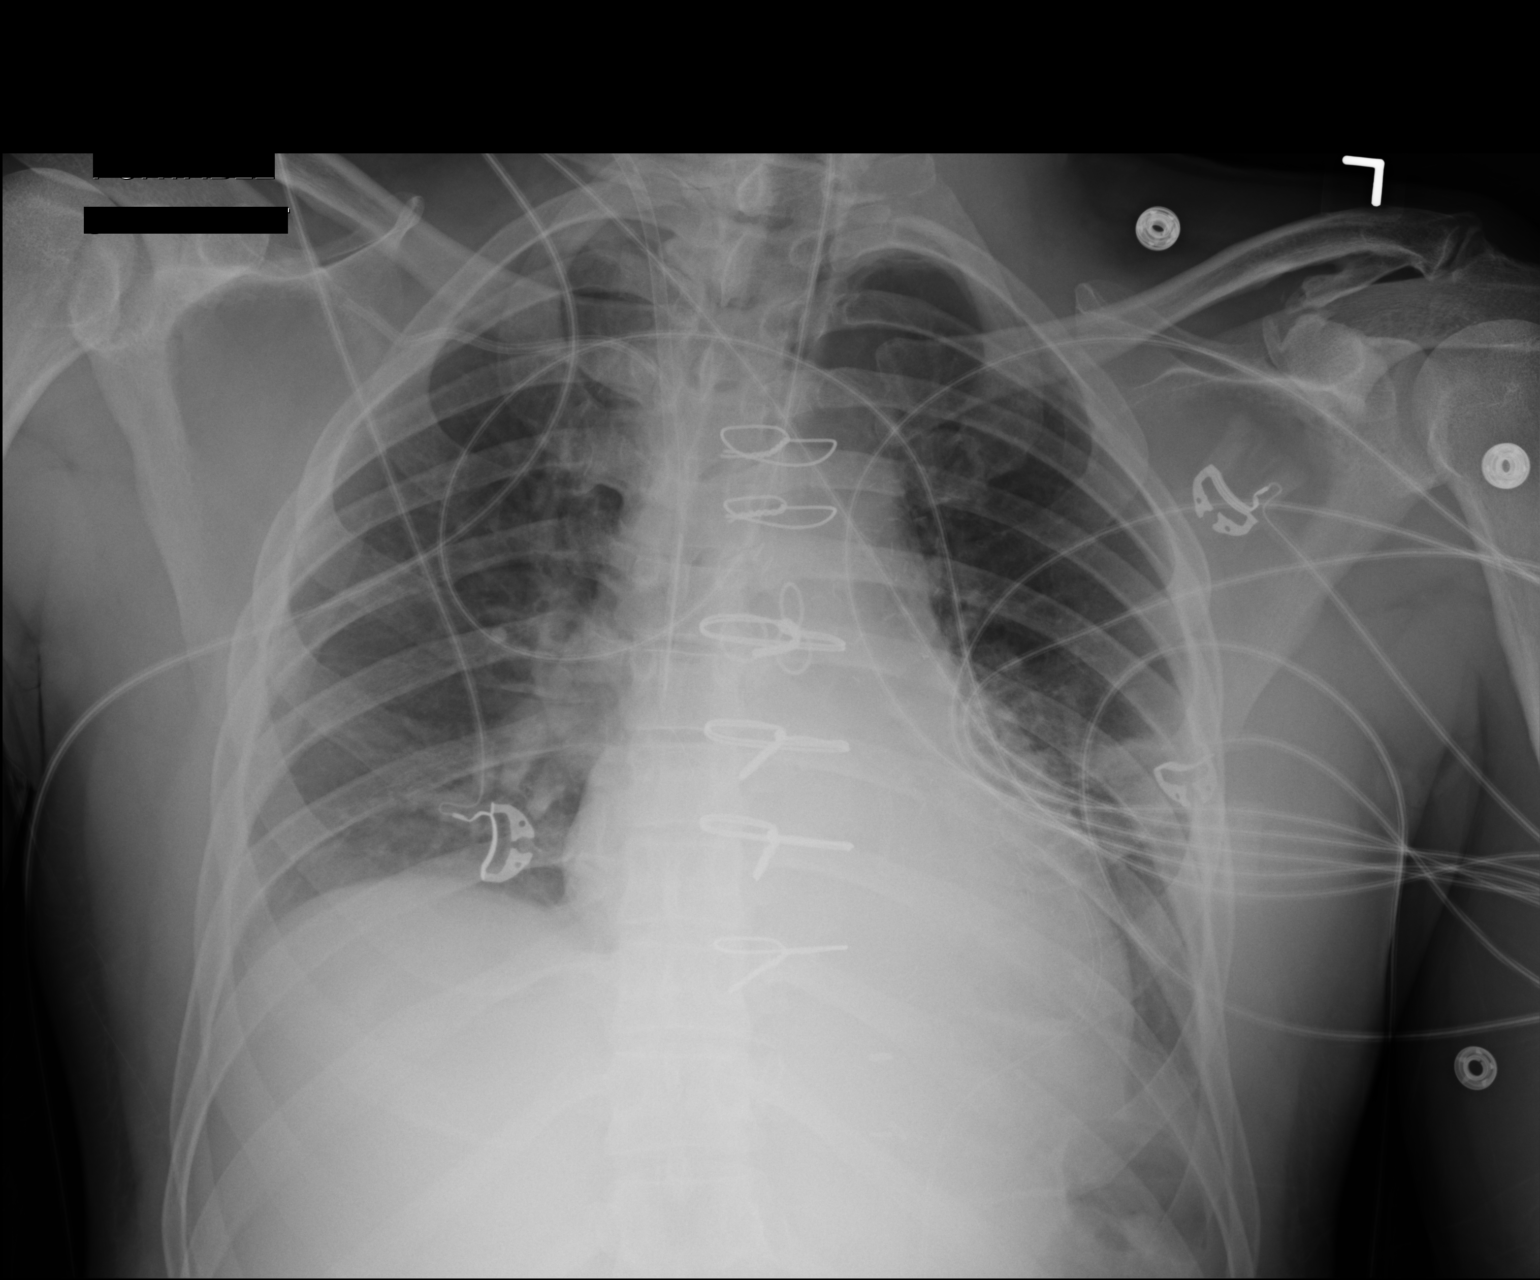

[1 of 1 positions shown; findings below may reference images not displayed]

FINDINGS: Interim removal of left chest tube, mediastinal drainage catheter,
and Swan-Ganz catheter. Right IJ sheath and right subclavian line in
stable position . Tiny left apical pneumothorax unchanged. Prior
CABG. Stable cardiomegaly. Persistent low lung volumes with mild
bibasilar atelectasis and/or infiltrates. No significant pleural
effusion noted.
IMPRESSION: 1. Interim removal of left chest tube, mediastinal drainage
catheter, and Swan-Ganz catheter. Right IJ sheath and right
subclavian line stable position. Stable tiny left apical
pneumothorax.
2. Prior CABG.  Stable cardiomegaly.
3. Persistent low lung volumes with bibasilar atelectasis.

## 2017-08-25 ENCOUNTER — Ambulatory Visit: Payer: Medicaid Other

## 2017-08-26 ENCOUNTER — Other Ambulatory Visit: Payer: Self-pay

## 2017-08-26 ENCOUNTER — Ambulatory Visit: Payer: Medicaid Other | Attending: Family Medicine | Admitting: Physician Assistant

## 2017-08-26 VITALS — BP 110/73 | HR 82 | Temp 98.0°F | Ht 71.0 in | Wt 167.8 lb

## 2017-08-26 DIAGNOSIS — E7800 Pure hypercholesterolemia, unspecified: Secondary | ICD-10-CM | POA: Diagnosis not present

## 2017-08-26 DIAGNOSIS — M109 Gout, unspecified: Secondary | ICD-10-CM | POA: Insufficient documentation

## 2017-08-26 DIAGNOSIS — Z8679 Personal history of other diseases of the circulatory system: Secondary | ICD-10-CM | POA: Diagnosis not present

## 2017-08-26 DIAGNOSIS — I10 Essential (primary) hypertension: Secondary | ICD-10-CM | POA: Insufficient documentation

## 2017-08-26 DIAGNOSIS — Z79899 Other long term (current) drug therapy: Secondary | ICD-10-CM | POA: Diagnosis not present

## 2017-08-26 DIAGNOSIS — E1151 Type 2 diabetes mellitus with diabetic peripheral angiopathy without gangrene: Secondary | ICD-10-CM | POA: Insufficient documentation

## 2017-08-26 DIAGNOSIS — I2510 Atherosclerotic heart disease of native coronary artery without angina pectoris: Secondary | ICD-10-CM | POA: Insufficient documentation

## 2017-08-26 DIAGNOSIS — Z87442 Personal history of urinary calculi: Secondary | ICD-10-CM | POA: Diagnosis not present

## 2017-08-26 DIAGNOSIS — Z7984 Long term (current) use of oral hypoglycemic drugs: Secondary | ICD-10-CM | POA: Insufficient documentation

## 2017-08-26 DIAGNOSIS — I25118 Atherosclerotic heart disease of native coronary artery with other forms of angina pectoris: Secondary | ICD-10-CM

## 2017-08-26 DIAGNOSIS — I252 Old myocardial infarction: Secondary | ICD-10-CM | POA: Insufficient documentation

## 2017-08-26 DIAGNOSIS — K219 Gastro-esophageal reflux disease without esophagitis: Secondary | ICD-10-CM | POA: Diagnosis not present

## 2017-08-26 DIAGNOSIS — R12 Heartburn: Secondary | ICD-10-CM | POA: Diagnosis not present

## 2017-08-26 DIAGNOSIS — Z951 Presence of aortocoronary bypass graft: Secondary | ICD-10-CM | POA: Insufficient documentation

## 2017-08-26 DIAGNOSIS — E1165 Type 2 diabetes mellitus with hyperglycemia: Secondary | ICD-10-CM | POA: Insufficient documentation

## 2017-08-26 DIAGNOSIS — Z7982 Long term (current) use of aspirin: Secondary | ICD-10-CM | POA: Insufficient documentation

## 2017-08-26 DIAGNOSIS — Z888 Allergy status to other drugs, medicaments and biological substances status: Secondary | ICD-10-CM | POA: Diagnosis not present

## 2017-08-26 DIAGNOSIS — R9431 Abnormal electrocardiogram [ECG] [EKG]: Secondary | ICD-10-CM

## 2017-08-26 LAB — POCT GLYCOSYLATED HEMOGLOBIN (HGB A1C): HEMOGLOBIN A1C: 6.3 % — AB (ref 4.0–5.6)

## 2017-08-26 LAB — GLUCOSE, POCT (MANUAL RESULT ENTRY): POC GLUCOSE: 176 mg/dL — AB (ref 70–99)

## 2017-08-26 MED ORDER — OMEPRAZOLE 20 MG PO CPDR: 20.0000 mg | DELAYED_RELEASE_CAPSULE | Freq: Every day | ORAL | 3 refills | Status: DC

## 2017-08-26 MED ORDER — METFORMIN HCL 500 MG PO TABS: ORAL_TABLET | ORAL | 1 refills | Status: DC

## 2017-08-26 MED FILL — OMEPRAZOLE 20 MG CAP: 20 | 30 days supply | Qty: 30 | Fill #0

## 2017-08-26 MED FILL — metFORMIN HCL 500 MG TABS: 500 | 30 days supply | Qty: 90 | Fill #0

## 2017-08-26 NOTE — Progress Notes (Signed)
Pt. Stated his chest have a discomfort feeling. Pt. Stated his stomach is burning.

## 2017-08-26 NOTE — Progress Notes (Signed)
Patient ID: Willie Walker, male   DOB: [birth year], 52 y.o.   MRN: [id number]    Willie Walker, is a 52 y.o. male  MVH:[id number]  [id number]:[id number]  DOB - 1965-05-28  Subjective:  Chief Complaint and HPI: Willie Walker is a 52 y.o. male here today for Stomach burning for hours after he eats.  This has been going on for several months.  He has a h/o CABG ~ 2 years ago.  Says this does not feel like cardiac CP.  No SOB, no diaphoresis.  No DOE.  No palpitations.  No radiating pain.  Some occasional constipation.  No diarrhea.  Appetite is fair but unchanged.     Social History:  +smoke marijuana qod, drinks alcohol 2-3 drinks twice a week.    ROS:   Constitutional:  No f/c, No night sweats, No unexplained weight loss. EENT:  No vision changes, No blurry vision, No hearing changes. No mouth, throat, or ear problems.  Respiratory: No cough, No SOB Cardiac: No CP, no palpitations GI:  +abd pain, No N/V/D. GU: No Urinary s/sx Musculoskeletal: No joint pain Neuro: No headache, no dizziness, no motor weakness.  Skin: No rash Endocrine:  No polydipsia. No polyuria.  Psych: Denies SI/HI  No problems updated.  ALLERGIES: Allergies  Allergen Reactions  . Crestor [Rosuvastatin] Other (See Comments)    "Makes my legs hurt" - tolerates simvastatin     PAST MEDICAL HISTORY: Past Medical History:  Diagnosis Date  . Anginal pain (HCC)   . Anxiety   . Childhood asthma   . Chronic lower back pain   . Coronary artery disease   . ELECTROCARDIOGRAM, ABNORMAL   . GERD (gastroesophageal reflux disease)   . Gout   . Headache(784.0) 09/18/2011   "maybe twice/wk; pressure on the brain"  . High cholesterol   . Hypertension   . Kidney stones   . Lower GI bleed 09/18/2011   "clots and everything; not lately"  . Myocardial infarction (HCC) 10/2005  . Peripheral vascular disease (HCC)    LLE  . Pneumonia 1990's  . Seizures (HCC) 09/18/2011   "blanks out on me"/wife's report  . Shortness of breath  09/18/11   "@ rest; lying down; w/exertion"  . Type I diabetes mellitus (HCC)     MEDICATIONS AT HOME: Prior to Admission medications   Medication Sig Start Date End Date Taking? Authorizing Provider  aspirin 325 MG EC tablet Take 1 tablet (325 mg total) by mouth daily. 12/05/15  Yes Quentin Angst, MD  cyclobenzaprine (FLEXERIL) 10 MG tablet Take 1 tablet (10 mg total) by mouth 2 (two) times daily as needed for muscle spasms. 05/18/17  Yes Hoy Register, MD  lisinopril (PRINIVIL,ZESTRIL) 10 MG tablet Take 1 tablet (10 mg total) by mouth daily. 05/18/17  Yes Hoy Register, MD  metFORMIN (GLUCOPHAGE) 500 MG tablet 2 tabs in the morning and one in the evening with food 08/26/17  Yes McClung, Angela M, PA-C  metoprolol tartrate (LOPRESSOR) 25 MG tablet Take 0.5 tablets (12.5 mg total) by mouth daily. 05/18/17  Yes Newlin, Odette Horns, MD  acetaminophen-codeine (TYLENOL #3) 300-30 MG tablet Take 1 tablet by mouth every 4 (four) hours as needed. Patient not taking: Reported on 05/18/2017 05/20/16   Quentin Angst, MD  allopurinol (ZYLOPRIM) 100 MG tablet Take 1 tablet (100 mg total) by mouth daily. Patient not taking: Reported on 05/18/2017 01/27/17   Anders Simmonds, PA-C  Colchicine 0.6 MG CAPS Take 1 capsule by mouth daily. Patient  not taking: Reported on 08/26/2017 10/21/16   Quentin AngstJegede, Olugbemiga E, MD  fish oil-omega-3 fatty acids 1000 MG capsule Take 1 g by mouth 2 (two) times daily.    [provider]  fluocinonide-emollient (LIDEX-E) 0.05 % cream Apply 1 application topically 2 (two) times daily. Patient not taking: Reported on 05/18/2017 03/19/17   Helane GuntherMayer, Gregory, DPM  gabapentin (NEURONTIN) 100 MG capsule Take 1 capsule (100 mg total) by mouth 3 (three) times daily. Patient not taking: Reported on 08/26/2017 05/18/17   Hoy RegisterNewlin, Enobong, MD  ramelteon (ROZEREM) 8 MG tablet Take 1 tablet (8 mg total) by mouth at bedtime. Patient not taking: Reported on 05/18/2017 10/21/16   Quentin AngstJegede,  Olugbemiga E, MD  simvastatin (ZOCOR) 20 MG tablet Take 1 tablet (20 mg total) by mouth at bedtime. Patient not taking: Reported on 08/26/2017 05/20/17   Hoy RegisterNewlin, Enobong, MD  traMADol (ULTRAM) 50 MG tablet Take 1 tablet (50 mg total) by mouth every 8 (eight) hours as needed. Patient not taking: Reported on 08/26/2017 03/17/17   Quentin AngstJegede, Olugbemiga E, MD  omeprazole (PRILOSEC) 20 MG capsule Take 20-40 mg by mouth daily.  12/29/11  [provider]     Objective:  EXAM:   Vitals:   08/26/17 0902  BP: 110/73  Pulse: 82  Temp: 98 F (36.7 C)  TempSrc: Oral  SpO2: 96%  Weight: 167 lb 12.8 oz (76.1 kg)  Height: 5\' 11"  (1.803 m)    General appearance : A&OX3. NAD. Non-toxic-appearing HEENT: Atraumatic and Normocephalic.  PERRLA. EOM intact.   Neck: supple, no JVD. No cervical lymphadenopathy. No thyromegaly Chest/Lungs:  Breathing-non-labored, Good air entry bilaterally, breath sounds normal without rales, rhonchi, or wheezing  CVS: S1 S2 regular, no murmurs, gallops, rubs  Abdomen: Bowel sounds present, Mild RUQ TTP, neg Murphy's.  Neg McBurney's.  Overall non-tender and not distended with no gaurding, rigidity or rebound. Extremities: Bilateral Lower Ext shows no edema, both legs are warm to touch with = pulse throughout Neurology:  CN II-XII grossly intact, Non focal.   Psych:  TP linear. J/I WNL. Normal speech. Appropriate eye contact and affect.  Skin:  No Rash  Data Review Lab Results  Component Value Date   HGBA1C 6.3 (A) 08/26/2017   HGBA1C 6.2 05/18/2017   HGBA1C 6.2 03/17/2017     Assessment & Plan   1. Heartburn Non acute abdomen. Trial Omeprazole 20mg  daily.  Heartburn diet recommended.   - EKG 12-Lead-there are no acute ST changes or elevation but his EKG has changed slightly since his last tracing.  Reviewed and discussed case with Dr Alvis LemmingsNewlin.  Patient is instructed to follow-up with cardiology and referral is placed.    2. Type 2 diabetes mellitus with  hyperglycemia, without long-term current use of insulin (HCC) Not at goal - Glucose (CBG) - HgB A1c - EKG 12-Lead Increase dose- metFORMIN (GLUCOPHAGE) 500 MG tablet; 2 tabs in the morning and one in the evening with food  Dispense: 270 tablet; Refill: 1  3. Essential hypertension, benign Controlled-continue current regimen.   - EKG 12-Lead  4.  H/o CABG, CAD  Patient have been counseled extensively about nutrition and exercise  Return in about 3 months (around 11/26/2017) for Newlin for DM and CVD.  The patient was given clear instructions to go to ER or return to medical center if symptoms don't improve, worsen or new problems develop. The patient verbalized understanding. The patient was told to call to get lab results if they haven't heard anything in  the next week.     Georgian Co, PA-C [facility] and [facility] [place], [place] [phone]   08/26/2017, 9:28 AM

## 2017-08-26 NOTE — Patient Instructions (Addendum)
Call 911 if you develop chest pain.  Call your cardiologist for a follow-up appt.   Heartburn Heartburn is a type of pain or discomfort that can happen in the throat or chest. It is often described as a burning pain. It may also cause a bad taste in the mouth. Heartburn may feel worse when you lie down or bend over. It may be caused by stomach contents that move back up (reflux) into the tube that connects the mouth with the stomach (esophagus). Follow these instructions at home: Take these actions to lessen your discomfort and to help avoid problems. Diet  Follow a diet as told by your doctor. You may need to avoid foods and drinks such as: ? Coffee and tea (with or without caffeine). ? Drinks that contain alcohol. ? Energy drinks and sports drinks. ? Carbonated drinks or sodas. ? Chocolate and cocoa. ? Peppermint and mint flavorings. ? Garlic and onions. ? Horseradish. ? Spicy and acidic foods, such as peppers, chili powder, curry powder, vinegar, hot sauces, and BBQ sauce. ? Citrus fruit juices and citrus fruits, such as oranges, lemons, and limes. ? Tomato-based foods, such as red sauce, chili, salsa, and pizza with red sauce. ? Fried and fatty foods, such as donuts, french fries, potato chips, and high-fat dressings. ? High-fat meats, such as hot dogs, rib eye steak, sausage, ham, and bacon. ? High-fat dairy items, such as whole milk, butter, and cream cheese.  Eat small meals often. Avoid eating large meals.  Avoid drinking large amounts of liquid with your meals.  Avoid eating meals during the 2-3 hours before bedtime.  Avoid lying down right after you eat.  Do not exercise right after you eat. General instructions  Pay attention to any changes in your symptoms.  Take over-the-counter and prescription medicines only as told by your doctor. Do not take aspirin, ibuprofen, or other NSAIDs unless your doctor says it is okay.  Do not use any tobacco products, including  cigarettes, chewing tobacco, and e-cigarettes. If you need help quitting, ask your doctor.  Wear loose clothes. Do not wear anything tight around your waist.  Raise (elevate) the head of your bed about 6 inches (15 cm).  Try to lower your stress. If you need help doing this, ask your doctor.  If you are overweight, lose an amount of weight that is healthy for you. Ask your doctor about a safe weight loss goal.  Keep all follow-up visits as told by your doctor. This is important. Contact a doctor if:  You have new symptoms.  You lose weight and you do not know why it is happening.  You have trouble swallowing, or it hurts to swallow.  You have wheezing or a cough that keeps happening.  Your symptoms do not get better with treatment.  You have heartburn often for more than two weeks. Get help right away if:  You have pain in your arms, neck, jaw, teeth, or back.  You feel sweaty, dizzy, or light-headed.  You have chest pain or shortness of breath.  You throw up (vomit) and your throw up looks like blood or coffee grounds.  Your poop (stool) is bloody or black. This information is not intended to replace advice given to you by your health care provider. Make sure you discuss any questions you have with your health care provider. Document Released: 09/17/2010 Document Revised: 06/13/2015 Document Reviewed: 05/02/2014 Elsevier Interactive Patient Education  [employer]2018 [employer]

## 2017-08-27 ENCOUNTER — Telehealth: Payer: Self-pay

## 2017-08-27 LAB — CBC WITH DIFFERENTIAL/PLATELET
BASOS ABS: 0 10*3/uL (ref 0.0–0.2)
BASOS: 0 %
EOS (ABSOLUTE): 0.3 10*3/uL (ref 0.0–0.4)
Eos: 3 %
HEMOGLOBIN: 14.3 g/dL (ref 13.0–17.7)
Hematocrit: 43.8 % (ref 37.5–51.0)
IMMATURE GRANS (ABS): 0 10*3/uL (ref 0.0–0.1)
Immature Granulocytes: 1 %
LYMPHS: 52 %
Lymphocytes Absolute: 4.2 10*3/uL — ABNORMAL HIGH (ref 0.7–3.1)
MCH: 28.8 pg (ref 26.6–33.0)
MCHC: 32.6 g/dL (ref 31.5–35.7)
MCV: 88 fL (ref 79–97)
MONOCYTES: 9 %
Monocytes Absolute: 0.7 10*3/uL (ref 0.1–0.9)
NEUTROS ABS: 2.9 10*3/uL (ref 1.4–7.0)
Neutrophils: 35 %
Platelets: 330 10*3/uL (ref 150–450)
RBC: 4.96 x10E6/uL (ref 4.14–5.80)
RDW: 14.4 % (ref 12.3–15.4)
WBC: 8.1 10*3/uL (ref 3.4–10.8)

## 2017-08-27 LAB — COMPREHENSIVE METABOLIC PANEL
ALT: 16 IU/L (ref 0–44)
AST: 20 IU/L (ref 0–40)
Albumin/Globulin Ratio: 1.2 (ref 1.2–2.2)
Albumin: 4.5 g/dL (ref 3.5–5.5)
Alkaline Phosphatase: 94 IU/L (ref 39–117)
BUN/Creatinine Ratio: 17 (ref 9–20)
BUN: 26 mg/dL — AB (ref 6–24)
Bilirubin Total: <0.2 mg/dL (ref 0.0–1.2)
CALCIUM: 10.9 mg/dL — AB (ref 8.7–10.2)
CO2: 23 mmol/L (ref 20–29)
CREATININE: 1.56 mg/dL — AB (ref 0.76–1.27)
Chloride: 102 mmol/L (ref 96–106)
GFR, EST AFRICAN AMERICAN: 57 mL/min/{1.73_m2} — AB (ref >59)
GFR, EST NON AFRICAN AMERICAN: 50 mL/min/{1.73_m2} — AB (ref >59)
GLUCOSE: 109 mg/dL — AB (ref 65–99)
Globulin, Total: 3.7 g/dL (ref 1.5–4.5)
Potassium: 5 mmol/L (ref 3.5–5.2)
Sodium: 139 mmol/L (ref 134–144)
TOTAL PROTEIN: 8.2 g/dL (ref 6.0–8.5)

## 2017-08-27 NOTE — Telephone Encounter (Signed)
CMA spoke to patient to inform on lab results.  Patient verified DOB. Patient understood.  

## 2017-08-27 NOTE — Telephone Encounter (Signed)
-----   Message from Anders SimmondsAngela M McClung, [place] sent at 08/27/2017  1:24 PM EDT ----- Please call patient.  Kidney function continues to be compromised.  Good control of blood sugar and blood pressure is very important.  Take meds and follow-up as planned.  Thanks, Georgian CoAngela McClung, PA-C

## 2017-08-28 LAB — H. PYLORI BREATH TEST: H pylori Breath Test: NEGATIVE

## 2017-08-31 ENCOUNTER — Telehealth: Payer: Self-pay

## 2017-08-31 NOTE — Telephone Encounter (Signed)
-----   Message from Anders SimmondsAngela M McClung, [place] sent at 08/29/2017  1:19 PM EDT ----- Please call patient.  Test for stomach ulcers was negative.  Follow-up as planned.  Thanks, Georgian CoAngela McClung, PA-C

## 2017-08-31 NOTE — Telephone Encounter (Signed)
CMA spoke to patient to inform on lab results.  Patient understood. Patient verified DOB.  

## 2017-09-04 NOTE — Progress Notes (Signed)
Cardiology Office Note    Date:  09/06/2017   ID:  Willie Walker, DOB 04/24/1965, MRN [id number]  PCP:  Hoy Register, MD  Cardiologist: Dr. Eden Emms  CC: CAD/CABG   History of Present Illness:  Willie Walker is a 52 y.o. male with a history of CAD s/p multiple PCIs and CABG x5V (07/2015), HTN, HLD, PVD and  polysubstance abuse Last EF in 2017 45-50%  History of CAD and multiple PCI procedures in the past. He had several stents placed in [place] with full metal jacket in the RCA and stent to the intermediate. LHC (8/292013) for chest pain showed:  dLM 30%, mLAD 60%, dLAD 40%, IM 30% ISR, D1 40-50%, OM/AV groove branch 95% ISR, pRCA 90% SIR, dRCA 80% ISR, EF 60%. Subsequently underwent cutting Balloon angioplasty to the mid RCA; Cutting Balloon angioplasty to the mid circumflex.   More angina in 2017 Cath by Dr. Swaziland.  revealed critical 3 vessel obstructive CAD and he was referred for CABG.  He was admitted from 7/13-7/22/17. He underwent CABG x5V utilizing LIMA to LAD, SVG to Diagonal, Sequential SVG to Ramus Intermediate and OM, and Radial artery to the PDA on 08/05/15 with Dr. Dorris Fetch. He also underwent open harvest of his left radial artery and endoscopic harvest of greater saphenous vein from right thigh. His hospital course was complicated by pericarditis  And elevated Cr with ACE  Only recent issue is gout worse in his feet Needs new eyeglass prescription     Past Medical History:  Diagnosis Date  . Anginal pain (HCC)   . Anxiety   . Childhood asthma   . Chronic lower back pain   . Coronary artery disease   . ELECTROCARDIOGRAM, ABNORMAL   . GERD (gastroesophageal reflux disease)   . Gout   . Headache(784.0) 09/18/2011   "maybe twice/wk; pressure on the brain"  . High cholesterol   . Hypertension   . Kidney stones   . Lower GI bleed 09/18/2011   "clots and everything; not lately"  . Myocardial infarction (HCC) 10/2005  . Peripheral vascular disease (HCC)    LLE  . Pneumonia 1990's  . Seizures (HCC) 09/18/2011   "blanks out on me"/wife's report  . Shortness of breath 09/18/11   "@ rest; lying down; w/exertion"  . Type I diabetes mellitus (HCC)     Past Surgical History:  Procedure Laterality Date  . BURR HOLE OF CRANIUM  1999   "mugged"  . CARDIAC CATHETERIZATION N/A 08/01/2015   Procedure: Left Heart Cath and Coronary Angiography;  Surgeon: Marjo Grosvenor M Swaziland, MD;  Location: [facility] INVASIVE CV LAB;  Service: Cardiovascular;  Laterality: N/A;  . CORONARY ANGIOPLASTY  09/18/2011  . CORONARY ANGIOPLASTY WITH STENT PLACEMENT  10/2005   "9"  . CORONARY ARTERY BYPASS GRAFT N/A 08/05/2015   Procedure: CORONARY ARTERY BYPASS GRAFTING (CABG), ON PUMP, TIMES FIVE, USING LEFT INTERNAL MAMMARY ARTERY AND LEFT RADIAL ARTERY, RIGHT GREATER SAPHENOUS VEIN HARVESTED ENDOSCOPICALLY;  Surgeon: Loreli Slot, MD;  Location: [facility] OR;  Service: Open Heart Surgery;  Laterality: N/A;  . LACERATION REPAIR  1999   BLE "mugging"  . PERCUTANEOUS CORONARY STENT INTERVENTION (PCI-S) N/A 09/18/2011   Procedure: PERCUTANEOUS CORONARY STENT INTERVENTION (PCI-S);  Surgeon: Kaylinn Dedic M Swaziland, MD;  Location: [facility] CATH LAB;  Service: Cardiovascular;  Laterality: N/A;  . RADIAL ARTERY HARVEST Left 08/05/2015   Procedure: RADIAL ARTERY HARVEST;  Surgeon: Loreli Slot, MD;  Location: [facility] OR;  Service: Open Heart Surgery;  Laterality:  Left;  . TEE WITHOUT CARDIOVERSION N/A 08/05/2015   Procedure: TRANSESOPHAGEAL ECHOCARDIOGRAM (TEE);  Surgeon: Loreli SlotSteven C Hendrickson, MD;  [facility];  Service: Open Heart Surgery;  Laterality: N/A;    Current Medications: Outpatient Medications Prior to Visit  Medication Sig Dispense Refill  . acetaminophen-codeine (TYLENOL #3) 300-30 MG tablet Take 1 tablet by mouth every 4 (four) hours as needed. 60 tablet 0  . allopurinol (ZYLOPRIM) 100 MG tablet Take 1 tablet (100 mg total) by mouth daily. 30 tablet 6  . aspirin 325 MG EC tablet Take 1 tablet  (325 mg total) by mouth daily. 60 tablet 3  . Colchicine 0.6 MG CAPS Take 1 capsule by mouth daily. 14 capsule 0  . fish oil-omega-3 fatty acids 1000 MG capsule Take 1 g by mouth 2 (two) times daily.    . fluocinonide-emollient (LIDEX-E) 0.05 % cream Apply 1 application topically 2 (two) times daily. 30 g 0  . gabapentin (NEURONTIN) 100 MG capsule Take 1 capsule (100 mg total) by mouth 3 (three) times daily. 90 capsule 6  . lisinopril (PRINIVIL,ZESTRIL) 10 MG tablet Take 1 tablet (10 mg total) by mouth daily. 90 tablet 1  . metFORMIN (GLUCOPHAGE) 500 MG tablet 2 tabs in the morning and one in the evening with food 270 tablet 1  . metoprolol tartrate (LOPRESSOR) 25 MG tablet Take 0.5 tablets (12.5 mg total) by mouth daily. 90 tablet 1  . omeprazole (PRILOSEC) 20 MG capsule Take 1 capsule (20 mg total) by mouth daily. 30 capsule 3  . ramelteon (ROZEREM) 8 MG tablet Take 1 tablet (8 mg total) by mouth at bedtime. 30 tablet 0  . simvastatin (ZOCOR) 20 MG tablet Take 1 tablet (20 mg total) by mouth at bedtime. 30 tablet 3  . traMADol (ULTRAM) 50 MG tablet Take 1 tablet (50 mg total) by mouth every 8 (eight) hours as needed. 60 tablet 0  . cyclobenzaprine (FLEXERIL) 10 MG tablet Take 1 tablet (10 mg total) by mouth 2 (two) times daily as needed for muscle spasms. (Patient not taking: Reported on 09/06/2017) 60 tablet 1   No facility-administered medications prior to visit.      Allergies:   Crestor [rosuvastatin]   Social History   Socioeconomic History  . Marital status: Divorced    Spouse name: Not on file  . Number of children: Not on file  . Years of education: Not on file  . Highest education level: Not on file  Occupational History  . Not on file  Social Needs  . Financial resource strain: Not on file  . Food insecurity:    Worry: Not on file    Inability: Not on file  . Transportation needs:    Medical: Not on file    Non-medical: Not on file  Tobacco Use  . Smoking status:  Former Smoker    Packs/day: 0.00    Years: 20.00    Pack years: 0.00    Types: Cigarettes    Last attempt to quit: 11/02/2005    Years since quitting: 11.8  . Smokeless tobacco: Never Used  Substance and Sexual Activity  . Alcohol use: Yes    Alcohol/week: 4.0 standard drinks    Types: 4 Shots of liquor per week    Comment: 08/01/2015  "coulple shots a couple times/week "  . Drug use: Yes    Types: Marijuana    Comment: 08/01/2015 "did some coke 07/23/2015; nothing since 2010 before that"  . Sexual activity: Not Currently  Lifestyle  . Physical activity:    Days per week: Not on file    Minutes per session: Not on file  . Stress: Not on file  Relationships  . Social connections:    Talks on phone: Not on file    Gets together: Not on file    Attends religious service: Not on file    Active member of club or organization: Not on file    Attends meetings of clubs or organizations: Not on file    Relationship status: Not on file  Other Topics Concern  . Not on file  Social History Narrative  . Not on file     Family History:  The patient's family history includes CVA in his father; Dementia in his mother; Heart attack in his maternal grandmother; Hypertension in his mother.     ROS:   Please see the history of present illness.    ROS All other systems reviewed and are negative.   PHYSICAL EXAM:   VS:  BP 112/82   Pulse 62   Ht 5\' 11"  (1.803 m)   Wt 170 lb 8 oz (77.3 kg)   SpO2 99%   BMI 23.78 kg/m    GEN: Well nourished, well developed, in no acute distress  HEENT: normal  Neck: no JVD, carotid bruits, or masses Cardiac: RRR; no murmurs, rubs, or gallops,no edema  Respiratory:  clear to auscultation bilaterally, normal work of breathing GI: soft, nontender, nondistended, + BS MS: no deformity or atrophy  Skin: warm and dry, no rash Neuro:  Alert and Oriented x 3, Strength and sensation are intact Psych: euthymic mood, full affect  Wt Readings from Last 3  Encounters:  09/06/17 170 lb 8 oz (77.3 kg)  08/26/17 167 lb 12.8 oz (76.1 kg)  05/18/17 171 lb 12.8 oz (77.9 kg)      Studies/Labs Reviewed:   EKG:  08/30/15  NSR HR 70 with non specific ST/TW abnormalities.   Recent Labs: 01/27/2017: TSH 1.170 08/26/2017: ALT 16; BUN 26; Creatinine, Ser 1.56; Hemoglobin 14.3; Platelets 330; Potassium 5.0; Sodium 139   Lipid Panel    Component Value Date/Time   CHOL 269 (H) 05/19/2017 0855   TRIG 200 (H) 05/19/2017 0855   HDL 47 05/19/2017 0855   CHOLHDL 5.7 (H) 05/19/2017 0855   CHOLHDL 4.4 07/31/2015 1051   VLDL 20 07/31/2015 1051   LDLCALC 182 (H) 05/19/2017 0855    Additional studies/ records that were reviewed today include:  LHC 08/01/15 Conclusion   Ost RCA to Mid RCA lesion, 40% stenosed. The lesion was previously treated with a drug-eluting stent greater than two years ago.  Mid RCA to Dist RCA lesion, 100% stenosed. The lesion was previously treated with a drug-eluting stent greater than two years ago.  Acute Mrg lesion, 90% stenosed.  Ramus lesion, 40% stenosed. The lesion was previously treated with a drug-eluting stent greater than two years ago.  Prox Cx to Mid Cx lesion, 100% stenosed. The lesion was previously treated with a drug-eluting stent greater than two years ago.  Ost 2nd Diag to 2nd Diag lesion, 70% stenosed.  Mid LAD to Dist LAD lesion, 50% stenosed.  Prox LAD to Mid LAD lesion, 95% stenosed.  The left ventricular systolic function is normal.   1. Critical 3 vessel obstructive CAD 2. Normal LV function  Plan: Admit telemetry. Start IV heparin and Ntg. CT surgery consult for CABG.    2D ECHO: 08/01/2015 LV EF: 45% -   50% Study  Conclusions - Left ventricle: The cavity size was normal. Wall thickness was   normal. Systolic function was mildly reduced. The estimated   ejection fraction was in the range of 45% to 50%. Diffuse   hypokinesis. Left ventricular diastolic function parameters were    normal. - Aorta: Aortic root dimension: 38 mm (ED). - Ascending aorta: The ascending aorta was mildly dilated.    ASSESSMENT & PLAN:   CAD: s/p multiple PCI procedures in the past and now CAGB x5V. Continue ASA 325mg  daily, statin and BB.   Polysubstance abuse: cocaine and mariajuana abstaining   HLD: on statin labs with [facility]  DMT2: Discussed low carb diet.  Target hemoglobin A1c is 6.5 or less.  Continue current medications.   Mild LV dysfunction: EF 45-50%. TTE 07/2015 no signs of CHF observe  Gout:  Avoid diuretics worse in feet colchicine per primary      Charlton Haws

## 2017-09-06 ENCOUNTER — Ambulatory Visit: Payer: Medicaid Other | Admitting: Cardiovascular Disease

## 2017-09-06 ENCOUNTER — Encounter: Payer: Self-pay | Admitting: Cardiovascular Disease

## 2017-09-06 VITALS — BP 112/82 | HR 62 | Ht 71.0 in | Wt 170.5 lb

## 2017-09-06 DIAGNOSIS — Z951 Presence of aortocoronary bypass graft: Secondary | ICD-10-CM | POA: Diagnosis not present

## 2017-09-06 NOTE — Patient Instructions (Signed)

## 2017-09-10 MED FILL — METOPROLOL TARTRATE 25 MG T: 25 | 30 days supply | Qty: 15 | Fill #6

## 2017-09-10 MED FILL — LISINOPRIL 10 MG TABS: 10 | 30 days supply | Qty: 30 | Fill #8

## 2017-09-10 MED FILL — metFORMIN HCL 500 MG TABS: 500 | 30 days supply | Qty: 60 | Fill #8

## 2017-09-22 MED FILL — OMEPRAZOLE 20 MG CAP: 20 | 30 days supply | Qty: 30 | Fill #1

## 2017-10-12 MED FILL — metFORMIN HCL 500 MG TABS: 500 | 30 days supply | Qty: 60 | Fill #9

## 2017-10-12 MED FILL — METOPROLOL TARTRATE 25 MG T: 25 | 30 days supply | Qty: 15 | Fill #7

## 2017-10-12 MED FILL — LISINOPRIL 10 MG TABS: 10 | 30 days supply | Qty: 30 | Fill #9

## 2017-10-25 MED FILL — OMEPRAZOLE 20 MG CAP: 20 | 30 days supply | Qty: 30 | Fill #2

## 2017-11-08 MED FILL — ALLOPURINOL 100 MG TABLET: 100 | 30 days supply | Qty: 30 | Fill #1

## 2017-11-15 MED FILL — METOPROLOL TARTRATE 25 MG T: 25 | 90 days supply | Qty: 45 | Fill #8

## 2017-11-15 MED FILL — LISINOPRIL 10 MG TABS: 10 | 90 days supply | Qty: 90 | Fill #0

## 2017-11-15 MED FILL — metFORMIN HCL 500 MG TABS: 500 | 90 days supply | Qty: 270 | Fill #1

## 2017-11-16 MED FILL — CYCLOBENZAPRINE 10 MG TAB: 10 | 30 days supply | Qty: 60 | Fill #1

## 2017-11-29 ENCOUNTER — Ambulatory Visit: Payer: Medicaid Other | Admitting: Family Medicine

## 2017-12-02 ENCOUNTER — Other Ambulatory Visit: Payer: Self-pay | Admitting: Podiatry

## 2017-12-02 ENCOUNTER — Ambulatory Visit: Payer: Medicaid Other | Admitting: Podiatry

## 2017-12-02 ENCOUNTER — Ambulatory Visit (INDEPENDENT_AMBULATORY_CARE_PROVIDER_SITE_OTHER): Payer: Medicaid Other

## 2017-12-02 ENCOUNTER — Encounter: Payer: Self-pay | Admitting: Podiatry

## 2017-12-02 DIAGNOSIS — M779 Enthesopathy, unspecified: Secondary | ICD-10-CM

## 2017-12-02 DIAGNOSIS — M7751 Other enthesopathy of right foot: Secondary | ICD-10-CM

## 2017-12-02 DIAGNOSIS — M10071 Idiopathic gout, right ankle and foot: Secondary | ICD-10-CM | POA: Diagnosis not present

## 2017-12-02 DIAGNOSIS — M79671 Pain in right foot: Secondary | ICD-10-CM

## 2017-12-02 DIAGNOSIS — M1000 Idiopathic gout, unspecified site: Secondary | ICD-10-CM

## 2017-12-02 MED ORDER — TRIAMCINOLONE ACETONIDE 10 MG/ML IJ SUSP
10.0000 mg | Freq: Once | INTRAMUSCULAR | Status: AC
  Administered 2017-12-02: 10 mg

## 2017-12-02 MED FILL — ALLOPURINOL 100 MG TABLET: 100 | 30 days supply | Qty: 30 | Fill #2

## 2017-12-02 MED FILL — OMEPRAZOLE 20 MG CAP: 20 | 30 days supply | Qty: 30 | Fill #3

## 2017-12-02 NOTE — Patient Instructions (Signed)

## 2017-12-02 NOTE — Progress Notes (Signed)
   Subjective:    Patient ID: Willie Walker, male    DOB: 02/02/1965, 52 y.o.   MRN: [id number]  HPI    Review of Systems  All other systems reviewed and are negative.      Objective:   Physical Exam        Assessment & Plan:

## 2017-12-06 NOTE — Progress Notes (Signed)
Subjective:   Patient ID: Willie Walker, male   DOB: 52 y.o.   MRN: [id number]   HPI Patient presents stating and had a lot of pain on top of my right foot and I do not remember specific injury but I had a history of gout and it kind of feels like that.  Patient states is been going on for several weeks and patient is not as active as he should be due to the pain   Review of Systems  All other systems reviewed and are negative.       Objective:  Physical Exam  Constitutional: He appears well-developed and well-nourished.  Cardiovascular: Intact distal pulses.  Pulmonary/Chest: Effort normal.  Musculoskeletal: Normal range of motion.  Neurological: He is alert.  Skin: Skin is warm.  Nursing note and vitals reviewed.   Neurovascular status intact muscle strength was adequate with inflammation pain of the dorsal lateral aspect right foot with inflammation fluid occurring of the tenderness junction.  It is very tender when pressed makes walking difficult and I did not note any acute redness or any break in skin associated with this     Assessment:  Appears to be acute tendinitis which may also be related to gout     Plan:  H&P conditions reviewed and today were to treat the cyst tendinitis and I did carefully inject the dorsal lateral aspect of the foot 3 mg Kenalog 5 Milgram Xylocaine advised on ice therapy anti-inflammatories and reappoint to recheck  X-rays are negative for signs of fracture or pathology associated with bony injury

## 2017-12-09 ENCOUNTER — Encounter: Payer: Self-pay | Admitting: Family Medicine

## 2017-12-09 ENCOUNTER — Ambulatory Visit: Payer: Medicaid Other | Attending: Family Medicine | Admitting: Family Medicine

## 2017-12-09 VITALS — BP 123/80 | HR 68 | Temp 98.1°F | Ht 71.0 in | Wt 174.6 lb

## 2017-12-09 DIAGNOSIS — I739 Peripheral vascular disease, unspecified: Secondary | ICD-10-CM | POA: Diagnosis not present

## 2017-12-09 DIAGNOSIS — Z955 Presence of coronary angioplasty implant and graft: Secondary | ICD-10-CM | POA: Insufficient documentation

## 2017-12-09 DIAGNOSIS — E1140 Type 2 diabetes mellitus with diabetic neuropathy, unspecified: Secondary | ICD-10-CM | POA: Insufficient documentation

## 2017-12-09 DIAGNOSIS — E1165 Type 2 diabetes mellitus with hyperglycemia: Secondary | ICD-10-CM | POA: Diagnosis not present

## 2017-12-09 DIAGNOSIS — I252 Old myocardial infarction: Secondary | ICD-10-CM | POA: Insufficient documentation

## 2017-12-09 DIAGNOSIS — R12 Heartburn: Secondary | ICD-10-CM | POA: Diagnosis not present

## 2017-12-09 DIAGNOSIS — M1000 Idiopathic gout, unspecified site: Secondary | ICD-10-CM | POA: Diagnosis not present

## 2017-12-09 DIAGNOSIS — Z79899 Other long term (current) drug therapy: Secondary | ICD-10-CM | POA: Diagnosis not present

## 2017-12-09 DIAGNOSIS — Z7984 Long term (current) use of oral hypoglycemic drugs: Secondary | ICD-10-CM | POA: Insufficient documentation

## 2017-12-09 DIAGNOSIS — I2510 Atherosclerotic heart disease of native coronary artery without angina pectoris: Secondary | ICD-10-CM | POA: Insufficient documentation

## 2017-12-09 DIAGNOSIS — I10 Essential (primary) hypertension: Secondary | ICD-10-CM | POA: Diagnosis not present

## 2017-12-09 DIAGNOSIS — E1149 Type 2 diabetes mellitus with other diabetic neurological complication: Secondary | ICD-10-CM | POA: Diagnosis not present

## 2017-12-09 DIAGNOSIS — Z951 Presence of aortocoronary bypass graft: Secondary | ICD-10-CM | POA: Diagnosis not present

## 2017-12-09 DIAGNOSIS — Z7982 Long term (current) use of aspirin: Secondary | ICD-10-CM | POA: Diagnosis not present

## 2017-12-09 LAB — POCT GLYCOSYLATED HEMOGLOBIN (HGB A1C): HbA1c, POC (controlled diabetic range): 6.4 % (ref 0.0–7.0)

## 2017-12-09 LAB — GLUCOSE, POCT (MANUAL RESULT ENTRY): POC Glucose: 120 mg/dl — AB (ref 70–99)

## 2017-12-09 MED ORDER — SIMVASTATIN 20 MG PO TABS: 20.0000 mg | ORAL_TABLET | Freq: Every day | ORAL | 1 refills | Status: DC

## 2017-12-09 MED ORDER — METFORMIN HCL 500 MG PO TABS: ORAL_TABLET | ORAL | 1 refills | Status: DC

## 2017-12-09 MED ORDER — METOPROLOL TARTRATE 25 MG PO TABS: 12.5000 mg | ORAL_TABLET | Freq: Every day | ORAL | 1 refills | Status: DC

## 2017-12-09 MED ORDER — OMEPRAZOLE 20 MG PO CPDR: 20.0000 mg | DELAYED_RELEASE_CAPSULE | Freq: Every day | ORAL | 1 refills | Status: DC

## 2017-12-09 MED ORDER — PREDNISONE 20 MG PO TABS: 20.0000 mg | ORAL_TABLET | Freq: Two times a day (BID) | ORAL | 0 refills | Status: DC

## 2017-12-09 MED ORDER — TRAMADOL HCL 50 MG PO TABS: 50.0000 mg | ORAL_TABLET | Freq: Three times a day (TID) | ORAL | 0 refills | Status: DC | PRN

## 2017-12-09 MED ORDER — ALLOPURINOL 300 MG PO TABS: 300.0000 mg | ORAL_TABLET | Freq: Every day | ORAL | 6 refills | Status: DC

## 2017-12-09 MED ORDER — GABAPENTIN 100 MG PO CAPS: 100.0000 mg | ORAL_CAPSULE | Freq: Three times a day (TID) | ORAL | 6 refills | Status: DC

## 2017-12-09 MED ORDER — LISINOPRIL 10 MG PO TABS: 10.0000 mg | ORAL_TABLET | Freq: Every day | ORAL | 1 refills | Status: DC

## 2017-12-09 MED ORDER — COLCHICINE 0.6 MG PO CAPS: ORAL_CAPSULE | ORAL | 2 refills | Status: DC

## 2017-12-09 MED FILL — MITIGARE 0.6 MG CAPSULE: 0.6 | 30 days supply | Qty: 30 | Fill #0

## 2017-12-09 MED FILL — SIMVASTATIN 20 MG TABLET: 20 | 30 days supply | Qty: 30 | Fill #0

## 2017-12-09 MED FILL — traMADol HCL 50 MG TABS: 50 | 7 days supply | Qty: 21 | Fill #0

## 2017-12-09 MED FILL — predniSONE 20 MG TABS: 20 | 5 days supply | Qty: 10 | Fill #0

## 2017-12-09 MED FILL — GABAPENTIN 100 MG CAPSULE: 100 | 30 days supply | Qty: 90 | Fill #0

## 2017-12-09 NOTE — Progress Notes (Signed)
Subjective:  Patient ID: Willie Walker, male    DOB: 01/19/1966  Age: 52 y.o. MRN: [id number]  CC: Diabetes and Gout   HPI Grayton Ladoris GeneW Mcbroom is a 52 year old male with a history of type 2 diabetes mellitus (A1c 6.4), hypertension, diabetic neuropathy, CAD status post CABG, gout here for follow-up visit. He complains of an acute gout flare with pain in the dorsum of his right foot for the last 6 weeks which has waxed and waned. He remains on allopurinol and colchicine.  At his last visit with podiatry 1 week ago he received Kenalog and Xylocaine injection due to right foot tendinitis with improvement in symptoms for 3 days and then symptoms returned. Right foot is tender, swollen and pain is rated as moderate without issues on and severe with his shoes.  He last saw cardiology 3 months ago and denies chest pain or dyspnea and has been compliant with his medications. Diabetes is controlled on current regimen and he denies hypoglycemia, visual concerns and neuropathy is controlled on gabapentin.  Past Medical History:  Diagnosis Date  . Anginal pain (HCC)   . Anxiety   . Childhood asthma   . Chronic lower back pain   . Coronary artery disease   . ELECTROCARDIOGRAM, ABNORMAL   . GERD (gastroesophageal reflux disease)   . Gout   . Headache(784.0) 09/18/2011   "maybe twice/wk; pressure on the brain"  . High cholesterol   . Hypertension   . Kidney stones   . Lower GI bleed 09/18/2011   "clots and everything; not lately"  . Myocardial infarction (HCC) 10/2005  . Peripheral vascular disease (HCC)    LLE  . Pneumonia 1990's  . Seizures (HCC) 09/18/2011   "blanks out on me"/wife's report  . Shortness of breath 09/18/11   "@ rest; lying down; w/exertion"  . Type I diabetes mellitus (HCC)     Past Surgical History:  Procedure Laterality Date  . BURR HOLE OF CRANIUM  1999   "mugged"  . CARDIAC CATHETERIZATION N/A 08/01/2015   Procedure: Left Heart Cath and Coronary Angiography;   Surgeon: Peter M SwazilandJordan, MD;  [facility];  Service: Cardiovascular;  Laterality: N/A;  . CORONARY ANGIOPLASTY  09/18/2011  . CORONARY ANGIOPLASTY WITH STENT PLACEMENT  10/2005   "9"  . CORONARY ARTERY BYPASS GRAFT N/A 08/05/2015   Procedure: CORONARY ARTERY BYPASS GRAFTING (CABG), ON PUMP, TIMES FIVE, USING LEFT INTERNAL MAMMARY ARTERY AND LEFT RADIAL ARTERY, RIGHT GREATER SAPHENOUS VEIN HARVESTED ENDOSCOPICALLY;  Surgeon: Loreli SlotSteven C Hendrickson, MD;  [facility];  Service: Open Heart Surgery;  Laterality: N/A;  . LACERATION REPAIR  1999   BLE "mugging"  . PERCUTANEOUS CORONARY STENT INTERVENTION (PCI-S) N/A 09/18/2011   Procedure: PERCUTANEOUS CORONARY STENT INTERVENTION (PCI-S);  Surgeon: Peter M SwazilandJordan, MD;  [facility];  Service: Cardiovascular;  Laterality: N/A;  . RADIAL ARTERY HARVEST Left 08/05/2015   Procedure: RADIAL ARTERY HARVEST;  Surgeon: Loreli SlotSteven C Hendrickson, MD;  [facility];  Service: Open Heart Surgery;  Laterality: Left;  . TEE WITHOUT CARDIOVERSION N/A 08/05/2015   Procedure: TRANSESOPHAGEAL ECHOCARDIOGRAM (TEE);  Surgeon: Loreli SlotSteven C Hendrickson, MD;  [facility];  Service: Open Heart Surgery;  Laterality: N/A;    Allergies  Allergen Reactions  . Crestor [Rosuvastatin] Other (See Comments)    "Makes my legs hurt" - tolerates simvastatin      Outpatient Medications Prior to Visit  Medication Sig Dispense Refill  . aspirin 325 MG EC tablet  Take 1 tablet (325 mg total) by mouth daily. 60 tablet 3  . cyclobenzaprine (FLEXERIL) 10 MG tablet   1  . fish oil-omega-3 fatty acids 1000 MG capsule Take 1 g by mouth 2 (two) times daily.    . fluocinonide-emollient (LIDEX-E) 0.05 % cream Apply 1 application topically 2 (two) times daily. 30 g 0  . ramelteon (ROZEREM) 8 MG tablet Take 1 tablet (8 mg total) by mouth at bedtime. 30 tablet 0  . acetaminophen-codeine (TYLENOL #3) 300-30 MG tablet Take 1 tablet by mouth every 4 (four) hours as needed. 60  tablet 0  . allopurinol (ZYLOPRIM) 100 MG tablet Take 1 tablet (100 mg total) by mouth daily. 30 tablet 6  . Colchicine 0.6 MG CAPS Take 1 capsule by mouth daily. 14 capsule 0  . gabapentin (NEURONTIN) 100 MG capsule Take 1 capsule (100 mg total) by mouth 3 (three) times daily. 90 capsule 6  . lisinopril (PRINIVIL,ZESTRIL) 10 MG tablet Take 1 tablet (10 mg total) by mouth daily. 90 tablet 1  . metFORMIN (GLUCOPHAGE) 500 MG tablet 2 tabs in the morning and one in the evening with food 270 tablet 1  . metoprolol tartrate (LOPRESSOR) 25 MG tablet Take 0.5 tablets (12.5 mg total) by mouth daily. 90 tablet 1  . omeprazole (PRILOSEC) 20 MG capsule Take 1 capsule (20 mg total) by mouth daily. 30 capsule 3  . simvastatin (ZOCOR) 20 MG tablet Take 1 tablet (20 mg total) by mouth at bedtime. 30 tablet 3  . traMADol (ULTRAM) 50 MG tablet Take 1 tablet (50 mg total) by mouth every 8 (eight) hours as needed. 60 tablet 0   No facility-administered medications prior to visit.     ROS Review of Systems  Constitutional: Negative for activity change and appetite change.  HENT: Negative for sinus pressure and sore throat.   Eyes: Negative for visual disturbance.  Respiratory: Negative for cough, chest tightness and shortness of breath.   Cardiovascular: Negative for chest pain and leg swelling.  Gastrointestinal: Negative for abdominal distention, abdominal pain, constipation and diarrhea.  Endocrine: Negative.   Genitourinary: Negative for dysuria.  Musculoskeletal:       See hpi  Skin: Negative for rash.  Allergic/Immunologic: Negative.   Neurological: Negative for weakness, light-headedness and numbness.  Psychiatric/Behavioral: Negative for dysphoric mood and suicidal ideas.    Objective:  BP 123/80   Pulse 68   Temp 98.1 F (36.7 C) (Oral)   Ht 5\' 11"  (1.803 m)   Wt 174 lb 9.6 oz (79.2 kg)   SpO2 100%   BMI 24.35 kg/m   BP/Weight 12/09/2017 09/06/2017 08/26/2017  Systolic BP 123 112 110    Diastolic BP 80 82 73  Wt. (Lbs) 174.6 170.5 167.8  BMI 24.35 23.78 23.4      Physical Exam  Constitutional: He is oriented to person, place, and time. He appears well-developed and well-nourished.  HENT:  Right Ear: External ear normal.  Left Ear: External ear normal.  Mouth/Throat: Oropharynx is clear and moist.  Neck: No JVD present.  Cardiovascular: Normal rate, normal heart sounds and intact distal pulses.  No murmur heard. Pulmonary/Chest: Effort normal and breath sounds normal. He has no wheezes. He has no rales. He exhibits no tenderness.  Abdominal: Soft. Bowel sounds are normal. He exhibits no distension and no mass. There is no tenderness.  Musculoskeletal: Normal range of motion.  Warmth and tenderness of dorsum of right foot and right big toe.  No redness  Neurological:  He is alert and oriented to person, place, and time.    CMP Latest Ref Rng & Units 08/26/2017 03/17/2017 01/27/2017  Glucose 65 - 99 mg/dL 161(W) 97 960(A)  BUN 6 - 24 mg/dL 54(U) 98(J) 23  Creatinine 0.76 - 1.27 mg/dL 1.91(Y) 7.82(N) 5.62(Z)  Sodium 134 - 144 mmol/L 139 139 140  Potassium 3.5 - 5.2 mmol/L 5.0 5.3(H) 5.1  Chloride 96 - 106 mmol/L 102 105 103  CO2 20 - 29 mmol/L 23 23 24   Calcium 8.7 - 10.2 mg/dL 10.9(H) 9.9 9.7  Total Protein 6.0 - 8.5 g/dL 8.2 7.9 8.0  Total Bilirubin 0.0 - 1.2 mg/dL <3.0 <8.6 0.2  Alkaline Phos 39 - 117 IU/L 94 93 89  AST 0 - 40 IU/L 20 25 26   ALT 0 - 44 IU/L 16 19 17     Lipid Panel     Component Value Date/Time   CHOL 269 (H) 05/19/2017 0855   TRIG 200 (H) 05/19/2017 0855   HDL 47 05/19/2017 0855   CHOLHDL 5.7 (H) 05/19/2017 0855   CHOLHDL 4.4 07/31/2015 1051   VLDL 20 07/31/2015 1051   LDLCALC 182 (H) 05/19/2017 0855    Lab Results  Component Value Date   HGBA1C 6.4 12/09/2017     Lab Results  Component Value Date   LABURIC 7.5 11/01/2014    Assessment & Plan:   1. Type 2 diabetes mellitus with other neurologic complication, without  long-term current use of insulin (HCC) Controlled with A1c of 6.4 Continue current regimen - POCT glucose (manual entry) - POCT glycosylated hemoglobin (Hb A1C) - gabapentin (NEURONTIN) 100 MG capsule; Take 1 capsule (100 mg total) by mouth 3 (three) times daily.  Dispense: 90 capsule; Refill: 6 - metFORMIN (GLUCOPHAGE) 500 MG tablet; 2 tabs in the morning and one in the evening with food  Dispense: 270 tablet; Refill: 1  2. Acute idiopathic gout, unspecified site Acute flare Placed on tramadol and prednisone which she will continue with colchicine. Once for the results he will commence allopurinol at an increased dose Discussed low purine eating plan - Uric Acid - allopurinol (ZYLOPRIM) 300 MG tablet; Take 1 tablet (300 mg total) by mouth daily.  Dispense: 30 tablet; Refill: 6 - traMADol (ULTRAM) 50 MG tablet; Take 1 tablet (50 mg total) by mouth every 8 (eight) hours as needed.  Dispense: 30 tablet; Refill: 0 - Basic Metabolic Panel  3. Essential hypertension, benign Controlled Counseled on blood pressure goal of less than 130/80, low-sodium, DASH diet, medication compliance, 150 minutes of moderate intensity exercise per week. Discussed medication compliance, adverse effects. - lisinopril (PRINIVIL,ZESTRIL) 10 MG tablet; Take 1 tablet (10 mg total) by mouth daily.  Dispense: 90 tablet; Refill: 1 - metoprolol tartrate (LOPRESSOR) 25 MG tablet; Take 0.5 tablets (12.5 mg total) by mouth daily.  Dispense: 90 tablet; Refill: 1  4. Heartburn Stable - omeprazole (PRILOSEC) 20 MG capsule; Take 1 capsule (20 mg total) by mouth daily.  Dispense: 90 capsule; Refill: 1  5. S/P CABG (coronary artery bypass graft) Asymptomatic Risk factor modification - simvastatin (ZOCOR) 20 MG tablet; Take 1 tablet (20 mg total) by mouth at bedtime.  Dispense: 90 tablet; Refill: 1    Meds ordered this encounter  Medications  . allopurinol (ZYLOPRIM) 300 MG tablet    Sig: Take 1 tablet (300 mg total) by  mouth daily.    Dispense:  30 tablet    Refill:  6    Discontinue previous dose  . predniSONE (DELTASONE)  20 MG tablet    Sig: Take 1 tablet (20 mg total) by mouth 2 (two) times daily with a meal.    Dispense:  10 tablet    Refill:  0  . traMADol (ULTRAM) 50 MG tablet    Sig: Take 1 tablet (50 mg total) by mouth every 8 (eight) hours as needed.    Dispense:  30 tablet    Refill:  0  . Colchicine 0.6 MG CAPS    Sig: Take 2 capsules at the onset of gout flare, may repeat 1 capsule in 2 hours if symptoms persist    Dispense:  30 capsule    Refill:  2    To replace tablet  . gabapentin (NEURONTIN) 100 MG capsule    Sig: Take 1 capsule (100 mg total) by mouth 3 (three) times daily.    Dispense:  90 capsule    Refill:  6  . lisinopril (PRINIVIL,ZESTRIL) 10 MG tablet    Sig: Take 1 tablet (10 mg total) by mouth daily.    Dispense:  90 tablet    Refill:  1  . metFORMIN (GLUCOPHAGE) 500 MG tablet    Sig: 2 tabs in the morning and one in the evening with food    Dispense:  270 tablet    Refill:  1  . metoprolol tartrate (LOPRESSOR) 25 MG tablet    Sig: Take 0.5 tablets (12.5 mg total) by mouth daily.    Dispense:  90 tablet    Refill:  1  . omeprazole (PRILOSEC) 20 MG capsule    Sig: Take 1 capsule (20 mg total) by mouth daily.    Dispense:  90 capsule    Refill:  1  . simvastatin (ZOCOR) 20 MG tablet    Sig: Take 1 tablet (20 mg total) by mouth at bedtime.    Dispense:  90 tablet    Refill:  1    Follow-up: Return in about 6 months (around 06/09/2018) for Follow-up of chronic medical conditions.   Hoy Register MD

## 2017-12-09 NOTE — Patient Instructions (Signed)

## 2017-12-10 LAB — BASIC METABOLIC PANEL
BUN / CREAT RATIO: 18 (ref 9–20)
BUN: 30 mg/dL — AB (ref 6–24)
CALCIUM: 10.4 mg/dL — AB (ref 8.7–10.2)
CHLORIDE: 98 mmol/L (ref 96–106)
CO2: 24 mmol/L (ref 20–29)
CREATININE: 1.66 mg/dL — AB (ref 0.76–1.27)
GFR calc Af Amer: 53 mL/min/{1.73_m2} — ABNORMAL LOW (ref >59)
GFR calc non Af Amer: 46 mL/min/{1.73_m2} — ABNORMAL LOW (ref >59)
GLUCOSE: 93 mg/dL (ref 65–99)
Potassium: 5 mmol/L (ref 3.5–5.2)
Sodium: 138 mmol/L (ref 134–144)

## 2017-12-10 LAB — URIC ACID: URIC ACID: 5.6 mg/dL (ref 3.7–8.6)

## 2017-12-13 ENCOUNTER — Telehealth: Payer: Self-pay

## 2017-12-13 NOTE — Telephone Encounter (Signed)
Patient was called and informed of lab results. 

## 2017-12-13 NOTE — Telephone Encounter (Signed)
-----   Message from Hoy RegisterEnobong Newlin, MD sent at 12/10/2017 10:20 AM EST ----- Uric acid level which is a marker of gout flare is normal.  Kidney function shows a decline but this is stable.  Continue current regimen

## 2018-01-17 MED FILL — OMEPRAZOLE 20 MG CAPSULE DR: 20 | 90 days supply | Qty: 90 | Fill #0

## 2018-02-10 MED FILL — METOPROLOL TARTRATE 25 MG T: 25 | 90 days supply | Qty: 45 | Fill #9

## 2018-03-18 ENCOUNTER — Ambulatory Visit: Payer: Medicaid Other | Admitting: Nurse Practitioner

## 2018-04-19 ENCOUNTER — Other Ambulatory Visit: Payer: Self-pay | Admitting: Family Medicine

## 2018-04-19 MED FILL — MITIGARE 0.6 MG CAPSULE: 0.6 | 60 days supply | Qty: 60 | Fill #1

## 2018-04-19 MED FILL — LISINOPRIL 10 MG TABS: 10 | 90 days supply | Qty: 90 | Fill #1

## 2018-04-19 MED FILL — metFORMIN HCL 500 MG TABS: 500 | 60 days supply | Qty: 180 | Fill #2

## 2018-04-19 MED FILL — OMEPRAZOLE 20 MG CAPSULE DR: 20 | 90 days supply | Qty: 90 | Fill #1

## 2018-04-19 MED FILL — METOPROLOL TARTRATE 25 MG T: 25 | 30 days supply | Qty: 15 | Fill #0

## 2018-04-20 MED FILL — CYCLOBENZAPRINE 10 MG TAB: 10 | 30 days supply | Qty: 60 | Fill #0

## 2018-06-06 LAB — HM DIABETES EYE EXAM

## 2018-06-17 ENCOUNTER — Other Ambulatory Visit: Payer: Self-pay | Admitting: Family Medicine

## 2018-06-17 DIAGNOSIS — R12 Heartburn: Secondary | ICD-10-CM

## 2018-06-17 MED FILL — metFORMIN HCL 500 MG TABS: 500 | 30 days supply | Qty: 90 | Fill #0

## 2018-06-17 MED FILL — METOPROLOL TARTRATE 25 MG T: 25 | 30 days supply | Qty: 15 | Fill #1

## 2018-07-05 ENCOUNTER — Telehealth: Payer: Self-pay | Admitting: Family Medicine

## 2018-07-05 NOTE — Telephone Encounter (Signed)
Patient called stating he has right shoulder/chest pain and is unsure on what is causing it. Please follow up.

## 2018-07-05 NOTE — Telephone Encounter (Signed)
Patients call taken.  Patient identified by name and date of birth.  Patient called with right shoulder pain.  Patient has had pain for two weeks.  Patient states the pain is intermittent.  Paitient rates pain as 3/10.  Patient states pain is aggravated by use of arm.  Patient does endorse ROM of arm.  Pain at times radiates to right chest.  Patient denies shortness of breath, lightheadedness's, cough of dizziness. Patient scheduled an appointment for tomorrow.  Patient acknowledged understanding of advice.

## 2018-07-06 ENCOUNTER — Other Ambulatory Visit: Payer: Self-pay

## 2018-07-06 ENCOUNTER — Encounter: Payer: Self-pay | Admitting: Family Medicine

## 2018-07-06 ENCOUNTER — Ambulatory Visit: Payer: Medicaid Other | Attending: Family Medicine | Admitting: Family Medicine

## 2018-07-06 VITALS — BP 105/70 | HR 53 | Temp 98.0°F | Ht 71.0 in | Wt 173.6 lb

## 2018-07-06 DIAGNOSIS — Z79899 Other long term (current) drug therapy: Secondary | ICD-10-CM | POA: Diagnosis not present

## 2018-07-06 DIAGNOSIS — I25119 Atherosclerotic heart disease of native coronary artery with unspecified angina pectoris: Secondary | ICD-10-CM

## 2018-07-06 DIAGNOSIS — N183 Chronic kidney disease, stage 3 unspecified: Secondary | ICD-10-CM

## 2018-07-06 DIAGNOSIS — M25511 Pain in right shoulder: Secondary | ICD-10-CM

## 2018-07-06 DIAGNOSIS — R079 Chest pain, unspecified: Secondary | ICD-10-CM | POA: Diagnosis present

## 2018-07-06 DIAGNOSIS — R001 Bradycardia, unspecified: Secondary | ICD-10-CM | POA: Diagnosis not present

## 2018-07-06 DIAGNOSIS — E1149 Type 2 diabetes mellitus with other diabetic neurological complication: Secondary | ICD-10-CM | POA: Diagnosis not present

## 2018-07-06 NOTE — Patient Instructions (Signed)
Coronary Artery Disease, Male ° °Coronary artery disease (CAD) is a condition in which the arteries that lead to the heart (coronary arteries) become narrow or blocked. The narrowing or blockage can lead to decreased blood flow to the heart. Prolonged reduced blood flow can cause a heart attack (myocardial infarction or MI). This condition may also be called coronary heart disease. °Because CAD is the leading cause of death in men, it is important to understand what causes this condition and how it is treated. °What are the causes? °CAD is most often caused by atherosclerosis. This is the buildup of fat and cholesterol (plaque) on the inside of the arteries. Over time, the plaque may narrow or block the artery, reducing blood flow to the heart. Plaque can also become weak and break off within a coronary artery and cause a sudden blockage. Other less common causes of CAD include: °· An embolism or blood clot in a coronary artery. °· A tearing of the artery (spontaneous coronary artery dissection). °· An aneurysm. °· Inflammation (vasculitis) in the artery wall. °What increases the risk? °The following factors may make you more likely to develop this condition: °· Age. Men over age 45 are at a greater risk of CAD. °· Family history of CAD. °· Gender. Men often develop CAD earlier in life than women. °· High blood pressure (hypertension). °· Diabetes. °· High cholesterol levels. °· Tobacco use. °· Excessive alcohol use. °· Lack of exercise. °· A diet high in saturated and trans fats, such as fried food and processed meat. °Other possible risk factors include: °· High stress levels. °· Depression. °· Obesity. °· Sleep apnea. °What are the signs or symptoms? °Many people do not have any symptoms during the early stages of CAD. As the condition progresses, symptoms may include: °· Chest pain (angina). The pain can: °? Feel like a crushing or squeezing, or a tightness, pressure, fullness, or heaviness in the chest. °? Last  more than a few minutes or can stop and recur. The pain tends to get worse with exercise or stress and to fade with rest. °· Pain in the arms, neck, jaw, or back. °· Unexplained heartburn or indigestion. °· Shortness of breath. °· Nausea or vomiting. °· Sudden light-headedness. °· Sudden cold sweats. °· Fluttering or fast heartbeat (palpitations). °How is this diagnosed? °This condition is diagnosed based on: °· Your family and medical history. °· A physical exam. °· Tests, including: °? A test to check the electrical signals in your heart (electrocardiogram). °? Exercise stress test. This looks for signs of blockage when the heart is stressed with exercise, such as running on a treadmill. °? Pharmacologic stress test. This test looks for signs of blockage when the heart is being stressed with a medicine. °? Blood tests. °? Coronary angiogram. This is a procedure to look at the coronary arteries to see if there is any blockage. During this test, a dye is injected into your arteries so they appear on an X-ray. °? A test that uses sound waves to take a picture of your heart (echocardiogram). °? Chest X-ray. °How is this treated? °This condition may be treated by: °· Healthy lifestyle changes to reduce risk factors. °· Medicines such as: °? Antiplatelet medicines and blood-thinning medicines, such as aspirin. These help to prevent blood clots. °? Nitroglycerin. °? Blood pressure medicines. °? Cholesterol-lowering medicine. °· Coronary angioplasty and stenting. During this procedure, a thin, flexible tube is inserted through a blood vessel and into a blocked artery. A balloon   or similar device on the end of the tube is inflated to open up the artery. In some cases, a small, mesh tube (stent) is inserted into the artery to keep it open. °· Coronary artery bypass surgery. During this surgery, veins or arteries from other parts of the body are used to create a bypass around the blockage and allow blood to reach your  heart. °Follow these instructions at home: °Medicines °· Take over-the-counter and prescription medicines only as told by your health care provider. °· Do not take the following medicines unless your health care provider approves: °? NSAIDs, such as ibuprofen, naproxen, or celecoxib. °? Vitamin supplements that contain vitamin A, vitamin E, or both. °Lifestyle °· Follow an exercise program approved by your health care provider. Aim for 150 minutes of moderate exercise or 75 minutes of vigorous exercise each week. °· Maintain a healthy weight or lose weight as approved by your health care provider. °· Rest when you are tired. °· Learn to manage stress or try to limit your stress. Ask your health care provider for suggestions if you need help. °· Get screened for depression and seek treatment, if needed. °· Do not use any products that contain nicotine or tobacco, such as cigarettes and e-cigarettes. If you need help quitting, ask your health care provider. °· Do not use illegal drugs. °Eating and drinking °· Follow a heart-healthy diet. A dietitian can help educate you about healthy food options and changes. In general, eat plenty of fruits and vegetables, lean meats, and whole grains. °· Avoid foods high in: °? Sugar. °? Salt (sodium). °? Saturated fat, such as processed or fatty meat. °? Trans fat, such as fried foods. °· Use healthy cooking methods such as roasting, grilling, broiling, baking, poaching, steaming, or stir-frying. °· If you drink alcohol, and your health care provider approves, limit your alcohol intake to no more than 2 drinks per day. One drink equals 12 ounces of beer, 5 ounces of wine, or 1½ ounces of hard liquor. °General instructions °· Manage any other health conditions, such as hypertension and diabetes. These conditions affect your heart. °· Your health care provider may ask you to monitor your blood pressure. Ideally, your blood pressure should be below 130/80. °· Keep all follow-up visits  as told by your health care provider. This is important. °Get help right away if: °· You have pain in your chest, neck, arm, jaw, stomach, or back that: °? Lasts more than a few minutes. °? Is recurring. °? Is not relieved by taking medicine under your tongue (sublingualnitroglycerin). °· You have too much (profuse) sweating without cause. °· You have unexplained: °? Heartburn or indigestion. °? Shortness of breath or difficulty breathing. °? Fluttering or fast heartbeat (palpitations). °? Nausea or vomiting. °? Fatigue. °? Feelings of nervousness or anxiety. °? Weakness. °? Diarrhea. °· You have sudden light-headedness or dizziness. °· You faint. °· You feel like hurting yourself or think about taking your own life. °These symptoms may represent a serious problem that is an emergency. Do not wait to see if the symptoms will go away. Get medical help right away. Call your local emergency services (911 in the [place] Do not drive yourself to the hospital. °Summary °· Coronary artery disease (CAD) is a process in which the arteries that lead to the heart (coronary arteries) become narrow or blocked. The narrowing or blockage can lead to a heart attack. °· Many people do not have any symptoms during the early stages of CAD.   This is called "silent CAD." °· CAD can be treated with lifestyle changes, medicines, surgery, or a combination of these treatments. °This information is not intended to replace advice given to you by your health care provider. Make sure you discuss any questions you have with your health care provider. °Document Released: 08/02/2013 Document Revised: 12/27/2015 Document Reviewed: 12/27/2015 °[employer]vier Interactive Patient Education © 2019 [employer] ° °

## 2018-07-06 NOTE — Progress Notes (Signed)
Pain in his right shoulder and it radiates to his chest.   Per pt it hurts to raise his right arm

## 2018-07-06 NOTE — Progress Notes (Signed)
Established Patient Office Visit  Subjective:  Patient ID: Willie HansenDaryle W Lasure, male    DOB: 10/29/1965  Age: 53 y.o. MRN: [id number]  CC: No chief complaint on file.   HPI Fishel Ladoris GeneW Kehres presents for ongoing treatment and medical management of chronic medical issues including hypertension, type 2 diabetes, coronary artery disease status post CABG and gout.  Patient was last seen in the office on 12/09/2017 by another provider.  In November, patient with good control of blood sugars with hemoglobin A1c of 6.3.  Patient also had uric acid level of 5.6.  He reports that he continues to take his allopurinol.  He denies any acute gout flareups.  Patient has had onset of acute pain in the right shoulder but he does not believe that this is related to gout.  Patient reports that he was reaching for something that was slightly behind him when he had onset of shoulder pain.  He denies any numbness or tingling in the arm, hands or fingertips.  He has increased pain with certain movements such as trying to carry a weighted object in his right hand or to reach overhead.       Patient believes that his blood sugars have remained well controlled.  He is currently taking metformin.  He denies any current issues with the use of this medication.  He also has some numbness and tingling in his feet related to diabetic neuropathy.  He is taking gabapentin.  He did have some use of nonsteroidal anti-inflammatories in the past due to foot pain as well as gout related pain.  He is aware that he does have some increase in creatinine consistent with chronic kidney disease.       Patient also with a history of chronic kidney disease.  He is taking his cholesterol medication and aspirin daily.  He has noticed some occasional left-sided chest pain especially with exertion.  Patient has not had recent cardiology follow-up.  He does not have any diaphoresis or nausea associated with this chest pain.  Chest pain is currently brief  lasting 1 to 2 minutes or less.  Patient also with slow heart rate but is taking metoprolol.  He denies any dizziness or lightheadedness related to slow heart rate.  He denies any issues with shortness of breath or cough.  No visual changes.  No sore throat or difficulty swallowing.  No recent fever or chills.  No peripheral edema.  No abdominal pain- no nausea or vomiting, no blood in the stool or dark stools.  Past Medical History:  Diagnosis Date  . Anginal pain (HCC)   . Anxiety   . Childhood asthma   . Chronic lower back pain   . Coronary artery disease   . ELECTROCARDIOGRAM, ABNORMAL   . GERD (gastroesophageal reflux disease)   . Gout   . Headache(784.0) 09/18/2011   "maybe twice/wk; pressure on the brain"  . High cholesterol   . Hypertension   . Kidney stones   . Lower GI bleed 09/18/2011   "clots and everything; not lately"  . Myocardial infarction (HCC) 10/2005  . Peripheral vascular disease (HCC)    LLE  . Pneumonia 1990's  . Seizures (HCC) 09/18/2011   "blanks out on me"/wife's report  . Shortness of breath 09/18/11   "@ rest; lying down; w/exertion"  . Type I diabetes mellitus (HCC)     Past Surgical History:  Procedure Laterality Date  . BURR HOLE OF CRANIUM  1999   "mugged"  .  CARDIAC CATHETERIZATION N/A 08/01/2015   Procedure: Left Heart Cath and Coronary Angiography;  Surgeon: Peter M SwazilandJordan, MD;  [facility];  Service: Cardiovascular;  Laterality: N/A;  . CORONARY ANGIOPLASTY  09/18/2011  . CORONARY ANGIOPLASTY WITH STENT PLACEMENT  10/2005   "9"  . CORONARY ARTERY BYPASS GRAFT N/A 08/05/2015   Procedure: CORONARY ARTERY BYPASS GRAFTING (CABG), ON PUMP, TIMES FIVE, USING LEFT INTERNAL MAMMARY ARTERY AND LEFT RADIAL ARTERY, RIGHT GREATER SAPHENOUS VEIN HARVESTED ENDOSCOPICALLY;  Surgeon: Loreli SlotSteven C Hendrickson, MD;  [facility];  Service: Open Heart Surgery;  Laterality: N/A;  . LACERATION REPAIR  1999   BLE "mugging"  . PERCUTANEOUS CORONARY  STENT INTERVENTION (PCI-S) N/A 09/18/2011   Procedure: PERCUTANEOUS CORONARY STENT INTERVENTION (PCI-S);  Surgeon: Peter M SwazilandJordan, MD;  [facility];  Service: Cardiovascular;  Laterality: N/A;  . RADIAL ARTERY HARVEST Left 08/05/2015   Procedure: RADIAL ARTERY HARVEST;  Surgeon: Loreli SlotSteven C Hendrickson, MD;  [facility];  Service: Open Heart Surgery;  Laterality: Left;  . TEE WITHOUT CARDIOVERSION N/A 08/05/2015   Procedure: TRANSESOPHAGEAL ECHOCARDIOGRAM (TEE);  Surgeon: Loreli SlotSteven C Hendrickson, MD;  [facility];  Service: Open Heart Surgery;  Laterality: N/A;    Family History  Problem Relation Age of Onset  . Hypertension Mother   . Dementia Mother   . CVA Father   . Heart attack Maternal Grandmother     Social History   Tobacco Use  . Smoking status: Former Smoker    Packs/day: 0.00    Years: 20.00    Pack years: 0.00    Types: Cigarettes    Quit date: 11/02/2005    Years since quitting: 12.7  . Smokeless tobacco: Never Used  Substance Use Topics  . Alcohol use: Yes    Alcohol/week: 4.0 standard drinks    Types: 4 Shots of liquor per week    Comment: 08/01/2015  "coulple shots a couple times/week "  . Drug use: Yes    Types: Marijuana    Comment: 08/01/2015 "did some coke 07/23/2015; nothing since 2010 before that"    Outpatient Medications Prior to Visit  Medication Sig Dispense Refill  . allopurinol (ZYLOPRIM) 300 MG tablet Take 1 tablet (300 mg total) by mouth daily. 30 tablet 6  . aspirin 325 MG EC tablet Take 1 tablet (325 mg total) by mouth daily. 60 tablet 3  . Colchicine 0.6 MG CAPS Take 2 capsules at the onset of gout flare, may repeat 1 capsule in 2 hours if symptoms persist 30 capsule 2  . cyclobenzaprine (FLEXERIL) 10 MG tablet TAKE 1 TABLET BY MOUTH 2 TIMES DAILY AS NEEDED FOR MUSCLE SPASMS. 60 tablet 1  . fish oil-omega-3 fatty acids 1000 MG capsule Take 1 g by mouth 2 (two) times daily.    Marland Kitchen. gabapentin (NEURONTIN) 100 MG capsule Take 1 capsule (100  mg total) by mouth 3 (three) times daily. 90 capsule 6  . lisinopril (PRINIVIL,ZESTRIL) 10 MG tablet Take 1 tablet (10 mg total) by mouth daily. 90 tablet 1  . metFORMIN (GLUCOPHAGE) 500 MG tablet 2 tabs in the morning and one in the evening with food 270 tablet 1  . metoprolol tartrate (LOPRESSOR) 25 MG tablet Take 0.5 tablets (12.5 mg total) by mouth daily. 90 tablet 1  . omeprazole (PRILOSEC) 20 MG capsule Take 1 capsule (20 mg total) by mouth daily. Needs appt for more refills. 30 capsule 0  . predniSONE (DELTASONE) 20 MG tablet Take 1 tablet (20  mg total) by mouth 2 (two) times daily with a meal. 10 tablet 0  . ramelteon (ROZEREM) 8 MG tablet Take 1 tablet (8 mg total) by mouth at bedtime. 30 tablet 0  . simvastatin (ZOCOR) 20 MG tablet Take 1 tablet (20 mg total) by mouth at bedtime. 90 tablet 1  . traMADol (ULTRAM) 50 MG tablet Take 1 tablet (50 mg total) by mouth every 8 (eight) hours as needed. 30 tablet 0  . fluocinonide-emollient (LIDEX-E) 0.05 % cream Apply 1 application topically 2 (two) times daily. (Patient not taking: Reported on 07/06/2018) 30 g 0   No facility-administered medications prior to visit.     Allergies  Allergen Reactions  . Crestor [Rosuvastatin] Other (See Comments)    "Makes my legs hurt" - tolerates simvastatin     ROS Review of Systems  Constitutional: Positive for fatigue. Negative for chills and fever.  HENT: Negative for sore throat and trouble swallowing.   Eyes: Negative for photophobia and visual disturbance.  Respiratory: Negative for cough and shortness of breath.   Cardiovascular: Positive for chest pain. Negative for palpitations and leg swelling.  Gastrointestinal: Negative for abdominal pain, constipation, diarrhea and nausea.  Endocrine: Negative for polyphagia and polyuria.  Genitourinary: Negative for dysuria and frequency.  Musculoskeletal: Positive for arthralgias. Negative for gait problem.  Neurological: Negative for dizziness and  headaches.  Hematological: Negative for adenopathy. Does not bruise/bleed easily.      Objective:    Physical Exam  Constitutional: He is oriented to person, place, and time. He appears well-developed and well-nourished.  Cardiovascular:  Slightly bradycardic  Pulmonary/Chest: Effort normal and breath sounds normal.  Abdominal: Soft. There is no abdominal tenderness. There is no rebound.  Musculoskeletal:        General: Tenderness present. No edema.     Comments: Patient with positive empty can/impingement sign at the right shoulder as well as posterior lateral and anterior lateral tenderness at the right shoulder/AC joint.  Patient with difficulty lifting the right arm to horizontal secondary to pain.  No evidence of acute gouty tophi or joint edema/erythema  Lymphadenopathy:    He has no cervical adenopathy.  Neurological: He is alert and oriented to person, place, and time.  Skin: Skin is dry.  Psychiatric: He has a normal mood and affect. His behavior is normal.  Nursing note and vitals reviewed.   BP 105/70 (BP Location: Left Arm, Patient Position: Sitting, Cuff Size: Large)   Pulse (!) 53   Temp 98 F (36.7 C) (Oral)   Ht 5\' 11"  (1.803 m)   Wt 173 lb 9.6 oz (78.7 kg)   SpO2 95%   BMI 24.21 kg/m  Wt Readings from Last 3 Encounters:  07/06/18 173 lb 9.6 oz (78.7 kg)  12/09/17 174 lb 9.6 oz (79.2 kg)  09/06/17 170 lb 8 oz (77.3 kg)     Health Maintenance Due  Topic Date Due  . FOOT EXAM  04/20/1973  . TETANUS/TDAP  04/21/1982  . COLONOSCOPY  04/20/2013  . HEMOGLOBIN A1C  06/09/2018    There are no preventive care reminders to display for this patient.  Lab Results  Component Value Date   TSH 1.170 01/27/2017   Lab Results  Component Value Date   WBC 8.1 08/26/2017   HGB 14.3 08/26/2017   HCT 43.8 08/26/2017   MCV 88 08/26/2017   PLT 330 08/26/2017   Lab Results  Component Value Date   NA 138 12/09/2017   K 5.0 12/09/2017  CO2 24 12/09/2017    GLUCOSE 93 12/09/2017   BUN 30 (H) 12/09/2017   CREATININE 1.66 (H) 12/09/2017   BILITOT <0.2 08/26/2017   ALKPHOS 94 08/26/2017   AST 20 08/26/2017   ALT 16 08/26/2017   PROT 8.2 08/26/2017   ALBUMIN 4.5 08/26/2017   CALCIUM 10.4 (H) 12/09/2017   ANIONGAP 8 08/29/2015   GFR 57.86 (L) 09/09/2011   Lab Results  Component Value Date   CHOL 269 (H) 05/19/2017   Lab Results  Component Value Date   HDL 47 05/19/2017   Lab Results  Component Value Date   LDLCALC 182 (H) 05/19/2017   Lab Results  Component Value Date   TRIG 200 (H) 05/19/2017   Lab Results  Component Value Date   CHOLHDL 5.7 (H) 05/19/2017   Lab Results  Component Value Date   HGBA1C 6.4 12/09/2017      Assessment & Plan:  1. Chest pain, unspecified type; 2.  Coronary artery disease involving native coronary artery of native heart with angina pectoris Patient with history of coronary artery disease status post CABG and now with occasional episodes of chest pain.  Patient is being referred for reevaluation by cardiology as he has not had recent follow-up.  Patient will also have CMP and CBC in follow-up of CAD and medication use. - Ambulatory referral to Cardiology - Comprehensive metabolic panel - CBC with Differential  3. Acute pain of right shoulder Patient with evidence of rotator cuff injury/tendinopathy based on today's exam of the right shoulder.  Patient is being referred to orthopedics for further evaluation and treatment.  He should avoid lifting weighted objects with the right hand greater than 5 pounds until he is evaluated further by orthopedics.  Because he is on chronic aspirin therapy he should avoid nonsteroidal anti-inflammatories but may take Tylenol as needed for pain - AMB referral to orthopedics  4. Bradycardia Patient with bradycardia on exam which is mild.  He is also on metoprolol secondary to history of coronary artery disease.  He denies any symptoms such as lightheaded or  dizziness related to his low heart rate.  Patient is being referred back to cardiology for further evaluation of CAD  5. Stage 3 chronic kidney disease (HCC) Patient with stage III chronic kidney disease and will have repeat creatinine/electrolytes as part of CMP as well as CBC to look for anemia and patient with ongoing diabetes and will be referred to establish with endocrinology - Ambulatory referral to Nephrology - Comprehensive metabolic panel - CBC with Differential  6. Encounter for long-term (current) use of medications He will have CMP at today's visit in follow-up of use of high-risk medications including statin medication and other medications for treatment of diabetes and coronary artery disease - Comprehensive metabolic panel  7. Type 2 diabetes mellitus with other neurologic complication, without long-term current use of insulin (HCC) Patient is encouraged to continue the use of metformin as well as a healthy diet.  Hemoglobin A1c will be obtained at today's visit.  Continue a healthy, low carbohydrate diet.  Continue Neurontin/gabapentin for nephropathy as well as continuation of exercise as tolerated after evaluation by cardiology.  Patient will be made aware of his hemoglobin A1c and if further changes are needed to help better control his blood sugars - Hemoglobin A1c   An After Visit Summary was printed and given to the patient.  Follow-up: Return in about 4 weeks (around 08/03/2018) for 4 week follow-up with PCP.   Cain Saupeammie Wandra Babin, MD

## 2018-07-07 LAB — COMPREHENSIVE METABOLIC PANEL WITH GFR
ALT: 22 IU/L (ref 0–44)
AST: 29 IU/L (ref 0–40)
Albumin/Globulin Ratio: 1.3 (ref 1.2–2.2)
Albumin: 4.3 g/dL (ref 3.8–4.9)
Alkaline Phosphatase: 86 IU/L (ref 39–117)
BUN/Creatinine Ratio: 17 (ref 9–20)
BUN: 26 mg/dL — ABNORMAL HIGH (ref 6–24)
Bilirubin Total: <0.2 mg/dL (ref 0.0–1.2)
CO2: 22 mmol/L (ref 20–29)
Calcium: 10.6 mg/dL — ABNORMAL HIGH (ref 8.7–10.2)
Chloride: 102 mmol/L (ref 96–106)
Creatinine, Ser: 1.53 mg/dL — ABNORMAL HIGH (ref 0.76–1.27)
GFR calc Af Amer: 58 mL/min/1.73 — ABNORMAL LOW
GFR calc non Af Amer: 50 mL/min/1.73 — ABNORMAL LOW
Globulin, Total: 3.2 g/dL (ref 1.5–4.5)
Glucose: 79 mg/dL (ref 65–99)
Potassium: 5.1 mmol/L (ref 3.5–5.2)
Sodium: 137 mmol/L (ref 134–144)
Total Protein: 7.5 g/dL (ref 6.0–8.5)

## 2018-07-07 LAB — CBC WITH DIFFERENTIAL/PLATELET
Basophils Absolute: 0.1 x10E3/uL (ref 0.0–0.2)
Basos: 1 %
EOS (ABSOLUTE): 0.3 x10E3/uL (ref 0.0–0.4)
Eos: 4 %
Hematocrit: 40.2 % (ref 37.5–51.0)
Hemoglobin: 13.6 g/dL (ref 13.0–17.7)
Immature Grans (Abs): 0 x10E3/uL (ref 0.0–0.1)
Immature Granulocytes: 0 %
Lymphocytes Absolute: 2.8 x10E3/uL (ref 0.7–3.1)
Lymphs: 41 %
MCH: 28.9 pg (ref 26.6–33.0)
MCHC: 33.8 g/dL (ref 31.5–35.7)
MCV: 86 fL (ref 79–97)
Monocytes Absolute: 0.8 x10E3/uL (ref 0.1–0.9)
Monocytes: 12 %
Neutrophils Absolute: 2.8 x10E3/uL (ref 1.4–7.0)
Neutrophils: 42 %
Platelets: 263 x10E3/uL (ref 150–450)
RBC: 4.7 x10E6/uL (ref 4.14–5.80)
RDW: 14.6 % (ref 11.6–15.4)
WBC: 6.8 x10E3/uL (ref 3.4–10.8)

## 2018-07-07 LAB — HEMOGLOBIN A1C
Est. average glucose Bld gHb Est-mCnc: 140 mg/dL
Hgb A1c MFr Bld: 6.5 % — ABNORMAL HIGH (ref 4.8–5.6)

## 2018-07-08 ENCOUNTER — Telehealth: Payer: Self-pay | Admitting: Emergency Medicine

## 2018-07-08 NOTE — Telephone Encounter (Signed)
Nurse called the patient's home phone number but received no answer and message was left on the voicemail for the patient to call back.  Return phone number given. 

## 2018-07-13 ENCOUNTER — Encounter: Payer: Self-pay | Admitting: Family Medicine

## 2018-07-14 ENCOUNTER — Ambulatory Visit (INDEPENDENT_AMBULATORY_CARE_PROVIDER_SITE_OTHER): Payer: Medicaid Other

## 2018-07-14 ENCOUNTER — Other Ambulatory Visit: Payer: Self-pay | Admitting: Orthopaedic Surgery

## 2018-07-14 ENCOUNTER — Other Ambulatory Visit: Payer: Self-pay

## 2018-07-14 ENCOUNTER — Encounter: Payer: Self-pay | Admitting: Orthopaedic Surgery

## 2018-07-14 ENCOUNTER — Telehealth: Payer: Self-pay | Admitting: Emergency Medicine

## 2018-07-14 ENCOUNTER — Ambulatory Visit (INDEPENDENT_AMBULATORY_CARE_PROVIDER_SITE_OTHER): Payer: Medicaid Other | Admitting: Orthopaedic Surgery

## 2018-07-14 DIAGNOSIS — M542 Cervicalgia: Secondary | ICD-10-CM

## 2018-07-14 DIAGNOSIS — M25511 Pain in right shoulder: Secondary | ICD-10-CM

## 2018-07-14 DIAGNOSIS — M19019 Primary osteoarthritis, unspecified shoulder: Secondary | ICD-10-CM

## 2018-07-14 MED FILL — METOPROLOL TARTRATE 25 MG T: 25 | 30 days supply | Qty: 15 | Fill #2

## 2018-07-14 NOTE — Progress Notes (Signed)
Subjective: He is here for ultrasound-guided right AC joint injection.  Objective: Point tender over the [facility] joint.  Procedure: Ultrasound-guided right AC joint injection: After sterile prep with Betadine, injected 5 cc 1% lidocaine without epinephrine and 40 mg methylprednisolone into the [facility] joint without complication.  Injectate was seen filling the joint capsule.  He had very good pain relief during the immediate anesthetic phase.  Follow-up as directed.

## 2018-07-14 NOTE — Telephone Encounter (Signed)
Patient contacted via phone to be given results of labs.  Patient identified by name and date of birth.  Patient given results of labs.  Patient educated on lab results. Questions answered. Patient acknowledged understanding of labs results. 

## 2018-07-14 NOTE — Progress Notes (Signed)
Office Visit Note   Patient: Willie Walker           Date of Birth: [birth year]           MRN: [id number] Visit Date: 07/14/2018              Requested by: Antony Blackbird, MD [place],  [place] [address] PCP: Charlott Rakes, MD   Assessment & Plan: Visit Diagnoses:  1. AC joint arthropathy   2. Right shoulder pain, unspecified chronicity   3. Neck pain     Plan: Impression is right shoulder AC arthropathy and rotator cuff tendinitis.  The patient appears to be more tender over the [facility] joint and thus referral to Dr. Junius Roads for an ultrasound-guided cortisone injection of the [facility] joint has been done.  If he has continued pain following this injection, he will follow-up for a subacromial cortisone injection.  Call with concerns or questions in the meantime.  Follow-Up Instructions: Return if symptoms worsen or fail to improve.   Orders:  No orders of the defined types were placed in this encounter.  No orders of the defined types were placed in this encounter.     Procedures: No procedures performed   Clinical Data: No additional findings.   Subjective: Chief Complaint  Patient presents with   Right Shoulder - Pain   Neck - Pain    HPI patient is a pleasant right-hand-dominant 53 year old gentleman who presents our clinic today with right shoulder pain.  This began approximately 3 weeks without known injury or change in activity.  He denies any significant activity with the right upper extremity.  He is currently not working.  The pain he has is to the entire right shoulder radiating into the clavicle, parascapular region and down the forearm into the hand.  He has increased pain with forward flexion and abduction.  He has pain sleeping on the right side as well.  He does not take any medication for his pain due to a previous history of drug abuse.  He denies any weakness, numbness, tingling or burning to the right upper extremity.  No previous shoulder  pathology.  Review of Systems as detailed in HPI.  All others reviewed and are negative.   Objective: Vital Signs: There were no vitals taken for this visit.  Physical Exam well-developed and well-nourished gentleman in no acute distress.  Alert and oriented x3.  Ortho Exam examination of his right shoulder reveals 75% forward flexion and abduction.  He can internally rotate to L5.  Markedly positive empty can and cross body abduction.  Marked tenderness over the [facility] joint.  Negative drop arm.  He has slight tenderness over the parascapular region.  He is neurovascularly intact distally.   Specialty Comments:  No specialty comments available.  Imaging: Xr Cervical Spine 2 Or 3 Views  Result Date: 07/14/2018 X-rays demonstrate significant degenerative disc disease worse at C5-6 and C6-7 with marked anterior spurring  Xr Shoulder Right  Result Date: 07/14/2018 X-rays demonstrate moderate AC degenerative changes.  Otherwise, no acute findings    PMFS History: Patient Active Problem List   Diagnosis Date Noted   Blurry vision, bilateral 10/21/2016   Memory loss 10/21/2016   Neuropathic pain of left foot 05/20/2016   Adjustment insomnia 04/22/2016   Post-op pain 02/19/2016   Otitis media 02/19/2016   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   Type 2 diabetes mellitus without complication, without long-term current use of insulin ([place]) 07/25/2015  Renal cyst 07/25/2015   Essential hypertension, benign 11/01/2014   Primary gout 11/01/2014   Polyneuropathy in diabetes(357.2) 07/28/2012   Diabetic foot ulcer associated with type 2 diabetes mellitus (HCC) 07/28/2012   Disc disease, degenerative, lumbar or lumbosacral 07/28/2012   Heel ulcer, right 06/20/2012   Type II or unspecified type diabetes mellitus without mention of complication, not stated as uncontrolled 06/20/2012   Erectile dysfunction associated with type 2 diabetes mellitus (HCC) 03/10/2012     CAD (coronary artery disease) 01/08/2012   Pancreatitis 01/08/2012   Unstable angina (HCC) 09/19/2011   Type 2 diabetes mellitus (HCC) 09/09/2011   Mixed hyperlipidemia 09/09/2011   HTN (hypertension) 09/09/2011   ELECTROCARDIOGRAM, ABNORMAL 12/12/2008   Past Medical History:  Diagnosis Date   Anginal pain (HCC)    Anxiety    Childhood asthma    Chronic lower back pain    Coronary artery disease    ELECTROCARDIOGRAM, ABNORMAL    GERD (gastroesophageal reflux disease)    Gout    Headache(784.0) 09/18/2011   "maybe twice/wk; pressure on the brain"   High cholesterol    Hypertension    Kidney stones    Lower GI bleed 09/18/2011   "clots and everything; not lately"   Myocardial infarction [facility](HCC) 10/2005   Peripheral vascular disease (HCC)    LLE   Pneumonia 1990's   Seizures (HCC) 09/18/2011   "blanks out on me"/wife's report   Shortness of breath 09/18/11   "@ rest; lying down; w/exertion"   Type I diabetes mellitus (HCC)     Family History  Problem Relation Age of Onset   Hypertension Mother    Dementia Mother    CVA Father    Heart attack Maternal Grandmother     Past Surgical History:  Procedure Laterality Date   BURR HOLE OF CRANIUM  1999   "mugged"   CARDIAC CATHETERIZATION N/A 08/01/2015   Procedure: Left Heart Cath and Coronary Angiography;  Surgeon: Peter M SwazilandJordan, MD;  [facility];  Service: Cardiovascular;  Laterality: N/A;   CORONARY ANGIOPLASTY  09/18/2011   CORONARY ANGIOPLASTY WITH STENT PLACEMENT  10/2005   "9"   CORONARY ARTERY BYPASS GRAFT N/A 08/05/2015   Procedure: CORONARY ARTERY BYPASS GRAFTING (CABG), ON PUMP, TIMES FIVE, USING LEFT INTERNAL MAMMARY ARTERY AND LEFT RADIAL ARTERY, RIGHT GREATER SAPHENOUS VEIN HARVESTED ENDOSCOPICALLY;  Surgeon: Loreli SlotSteven C Hendrickson, MD;  [facility];  Service: Open Heart Surgery;  Laterality: N/A;   LACERATION REPAIR  1999   BLE "mugging"   PERCUTANEOUS  CORONARY STENT INTERVENTION (PCI-S) N/A 09/18/2011   Procedure: PERCUTANEOUS CORONARY STENT INTERVENTION (PCI-S);  Surgeon: Peter M SwazilandJordan, MD;  [facility];  Service: Cardiovascular;  Laterality: N/A;   RADIAL ARTERY HARVEST Left 08/05/2015   Procedure: RADIAL ARTERY HARVEST;  Surgeon: Loreli SlotSteven C Hendrickson, MD;  [facility];  Service: Open Heart Surgery;  Laterality: Left;   TEE WITHOUT CARDIOVERSION N/A 08/05/2015   Procedure: TRANSESOPHAGEAL ECHOCARDIOGRAM (TEE);  Surgeon: Loreli SlotSteven C Hendrickson, MD;  [facility];  Service: Open Heart Surgery;  Laterality: N/A;   Social History   Occupational History   Not on file  Tobacco Use   Smoking status: Former Smoker    Packs/day: 0.00    Years: 20.00    Pack years: 0.00    Types: Cigarettes    Quit date: 11/02/2005    Years since quitting: 12.7   Smokeless tobacco: Never Used  Substance and Sexual Activity   Alcohol use:  Yes    Alcohol/week: 4.0 standard drinks    Types: 4 Shots of liquor per week    Comment: 08/01/2015  "coulple shots a couple times/week "   Drug use: Yes    Types: Marijuana    Comment: 08/01/2015 "did some coke 07/23/2015; nothing since 2010 before that"   Sexual activity: Not Currently

## 2018-07-19 ENCOUNTER — Other Ambulatory Visit: Payer: Self-pay | Admitting: Pharmacist

## 2018-07-19 DIAGNOSIS — I10 Essential (primary) hypertension: Secondary | ICD-10-CM

## 2018-07-19 MED ORDER — LISINOPRIL 10 MG PO TABS: 10.0000 mg | ORAL_TABLET | Freq: Every day | ORAL | 2 refills | Status: DC

## 2018-07-19 MED FILL — LISINOPRIL 10 MG TABS: 10 | 30 days supply | Qty: 30 | Fill #0

## 2018-07-28 DIAGNOSIS — H524 Presbyopia: Secondary | ICD-10-CM | POA: Diagnosis not present

## 2018-08-02 ENCOUNTER — Ambulatory Visit: Payer: Medicaid Other | Attending: Family Medicine | Admitting: Family Medicine

## 2018-08-02 ENCOUNTER — Other Ambulatory Visit: Payer: Self-pay

## 2018-08-02 ENCOUNTER — Encounter: Payer: Self-pay | Admitting: Family Medicine

## 2018-08-02 VITALS — BP 124/82 | HR 52 | Ht 71.0 in | Wt 176.0 lb

## 2018-08-02 DIAGNOSIS — I252 Old myocardial infarction: Secondary | ICD-10-CM | POA: Diagnosis not present

## 2018-08-02 DIAGNOSIS — E1151 Type 2 diabetes mellitus with diabetic peripheral angiopathy without gangrene: Secondary | ICD-10-CM | POA: Diagnosis not present

## 2018-08-02 DIAGNOSIS — E1149 Type 2 diabetes mellitus with other diabetic neurological complication: Secondary | ICD-10-CM | POA: Diagnosis not present

## 2018-08-02 DIAGNOSIS — I2510 Atherosclerotic heart disease of native coronary artery without angina pectoris: Secondary | ICD-10-CM | POA: Insufficient documentation

## 2018-08-02 DIAGNOSIS — E7800 Pure hypercholesterolemia, unspecified: Secondary | ICD-10-CM | POA: Diagnosis not present

## 2018-08-02 DIAGNOSIS — L84 Corns and callosities: Secondary | ICD-10-CM | POA: Diagnosis not present

## 2018-08-02 DIAGNOSIS — Z951 Presence of aortocoronary bypass graft: Secondary | ICD-10-CM | POA: Insufficient documentation

## 2018-08-02 DIAGNOSIS — E1140 Type 2 diabetes mellitus with diabetic neuropathy, unspecified: Secondary | ICD-10-CM | POA: Insufficient documentation

## 2018-08-02 DIAGNOSIS — E1165 Type 2 diabetes mellitus with hyperglycemia: Secondary | ICD-10-CM | POA: Diagnosis not present

## 2018-08-02 DIAGNOSIS — R12 Heartburn: Secondary | ICD-10-CM | POA: Diagnosis not present

## 2018-08-02 DIAGNOSIS — Z7984 Long term (current) use of oral hypoglycemic drugs: Secondary | ICD-10-CM | POA: Diagnosis not present

## 2018-08-02 DIAGNOSIS — Z1211 Encounter for screening for malignant neoplasm of colon: Secondary | ICD-10-CM | POA: Diagnosis not present

## 2018-08-02 DIAGNOSIS — Z955 Presence of coronary angioplasty implant and graft: Secondary | ICD-10-CM | POA: Diagnosis not present

## 2018-08-02 DIAGNOSIS — Z79899 Other long term (current) drug therapy: Secondary | ICD-10-CM | POA: Diagnosis not present

## 2018-08-02 DIAGNOSIS — Z87442 Personal history of urinary calculi: Secondary | ICD-10-CM | POA: Insufficient documentation

## 2018-08-02 DIAGNOSIS — I10 Essential (primary) hypertension: Secondary | ICD-10-CM | POA: Diagnosis not present

## 2018-08-02 DIAGNOSIS — Z8249 Family history of ischemic heart disease and other diseases of the circulatory system: Secondary | ICD-10-CM | POA: Diagnosis not present

## 2018-08-02 DIAGNOSIS — Z7982 Long term (current) use of aspirin: Secondary | ICD-10-CM | POA: Diagnosis not present

## 2018-08-02 DIAGNOSIS — M1000 Idiopathic gout, unspecified site: Secondary | ICD-10-CM | POA: Insufficient documentation

## 2018-08-02 DIAGNOSIS — K219 Gastro-esophageal reflux disease without esophagitis: Secondary | ICD-10-CM | POA: Insufficient documentation

## 2018-08-02 LAB — GLUCOSE, POCT (MANUAL RESULT ENTRY): POC Glucose: 136 mg/dl — AB (ref 70–99)

## 2018-08-02 MED ORDER — OMEPRAZOLE 20 MG PO CPDR: 20.0000 mg | DELAYED_RELEASE_CAPSULE | Freq: Every day | ORAL | 6 refills | Status: DC

## 2018-08-02 MED ORDER — METFORMIN HCL 500 MG PO TABS: ORAL_TABLET | ORAL | 1 refills | Status: DC

## 2018-08-02 MED ORDER — COLCHICINE 0.6 MG PO CAPS: ORAL_CAPSULE | ORAL | 2 refills | Status: DC

## 2018-08-02 MED ORDER — SIMVASTATIN 20 MG PO TABS: 20.0000 mg | ORAL_TABLET | Freq: Every day | ORAL | 1 refills | Status: DC

## 2018-08-02 MED ORDER — LISINOPRIL 10 MG PO TABS: 10.0000 mg | ORAL_TABLET | Freq: Every day | ORAL | 6 refills | Status: DC

## 2018-08-02 MED ORDER — METOPROLOL TARTRATE 25 MG PO TABS: 12.5000 mg | ORAL_TABLET | Freq: Every day | ORAL | 1 refills | Status: DC

## 2018-08-02 MED ORDER — GABAPENTIN 100 MG PO CAPS: 100.0000 mg | ORAL_CAPSULE | Freq: Three times a day (TID) | ORAL | 6 refills | Status: DC

## 2018-08-02 MED ORDER — ALLOPURINOL 300 MG PO TABS: 300.0000 mg | ORAL_TABLET | Freq: Every day | ORAL | 6 refills | Status: DC

## 2018-08-02 MED FILL — OMEPRAZOLE 20 MG CAP: 20 | 30 days supply | Qty: 30 | Fill #0

## 2018-08-02 MED FILL — GABAPENTIN 100 MG CAP: 100 | 30 days supply | Qty: 90 | Fill #0

## 2018-08-02 MED FILL — metFORMIN HCL 500 MG TABS: 500 | 30 days supply | Qty: 90 | Fill #0

## 2018-08-02 MED FILL — SIMVASTATIN 20 MG TABLET: 20 | 30 days supply | Qty: 30 | Fill #0

## 2018-08-02 MED FILL — METOPROLOL TARTRATE 25 MG T: 25 | 30 days supply | Qty: 15 | Fill #0

## 2018-08-02 MED FILL — MITIGARE 0.6 MG CAPSULE: 0.6 | 10 days supply | Qty: 30 | Fill #0

## 2018-08-02 MED FILL — ALLOPURINOL 300 MG TAB: 300 | 30 days supply | Qty: 30 | Fill #0

## 2018-08-02 MED FILL — LISINOPRIL 10 MG TABS: 10 | 30 days supply | Qty: 30 | Fill #0

## 2018-08-02 NOTE — Progress Notes (Signed)
Patient has two spots on the bottom of foot .  Patient is fasting and has not had morning medications.

## 2018-08-02 NOTE — Patient Instructions (Signed)

## 2018-08-02 NOTE — Progress Notes (Signed)
Subjective:  Patient ID: Willie Walker, male    DOB: 12/26/1965  Age: 53 y.o. MRN: [id number]  CC: Diabetes   HPI Willie Walker is a 53 year old male with a history of type 2 diabetes mellitus (A1c 6.5), hypertension, diabetic neuropathy, CAD status post CABG, gout here for follow-up visit. He has a callus on the sole of his left foot which he would like to be checked out as he does have pain on weightbearing he had a previous callus on the same foot which was evaluated by a podiatrist. With regards to his diabetes mellitus he is doing well and denies numbness in his extremities, hypoglycemia or visual concerns. He denies gout flares and has been stable on his medications. He has had no chest pains, dyspnea or paroxysmal nocturnal dyspnea, pedal edema. Endorses compliance with all his medications.  Past Medical History:  Diagnosis Date  . Anginal pain (HCC)   . Anxiety   . Childhood asthma   . Chronic lower back pain   . Coronary artery disease   . ELECTROCARDIOGRAM, ABNORMAL   . GERD (gastroesophageal reflux disease)   . Gout   . Headache(784.0) 09/18/2011   "maybe twice/wk; pressure on the brain"  . High cholesterol   . Hypertension   . Kidney stones   . Lower GI bleed 09/18/2011   "clots and everything; not lately"  . Myocardial infarction (HCC) 10/2005  . Peripheral vascular disease (HCC)    LLE  . Pneumonia 1990's  . Seizures (HCC) 09/18/2011   "blanks out on me"/wife's report  . Shortness of breath 09/18/11   "@ rest; lying down; w/exertion"  . Type I diabetes mellitus (HCC)     Past Surgical History:  Procedure Laterality Date  . BURR HOLE OF CRANIUM  1999   "mugged"  . CARDIAC CATHETERIZATION N/A 08/01/2015   Procedure: Left Heart Cath and Coronary Angiography;  Surgeon: Peter M SwazilandJordan, MD;  [facility];  Service: Cardiovascular;  Laterality: N/A;  . CORONARY ANGIOPLASTY  09/18/2011  . CORONARY ANGIOPLASTY WITH STENT PLACEMENT  10/2005   "9"   . CORONARY ARTERY BYPASS GRAFT N/A 08/05/2015   Procedure: CORONARY ARTERY BYPASS GRAFTING (CABG), ON PUMP, TIMES FIVE, USING LEFT INTERNAL MAMMARY ARTERY AND LEFT RADIAL ARTERY, RIGHT GREATER SAPHENOUS VEIN HARVESTED ENDOSCOPICALLY;  Surgeon: Loreli SlotSteven C Hendrickson, MD;  [facility];  Service: Open Heart Surgery;  Laterality: N/A;  . LACERATION REPAIR  1999   BLE "mugging"  . PERCUTANEOUS CORONARY STENT INTERVENTION (PCI-S) N/A 09/18/2011   Procedure: PERCUTANEOUS CORONARY STENT INTERVENTION (PCI-S);  Surgeon: Peter M SwazilandJordan, MD;  [facility];  Service: Cardiovascular;  Laterality: N/A;  . RADIAL ARTERY HARVEST Left 08/05/2015   Procedure: RADIAL ARTERY HARVEST;  Surgeon: Loreli SlotSteven C Hendrickson, MD;  [facility];  Service: Open Heart Surgery;  Laterality: Left;  . TEE WITHOUT CARDIOVERSION N/A 08/05/2015   Procedure: TRANSESOPHAGEAL ECHOCARDIOGRAM (TEE);  Surgeon: Loreli SlotSteven C Hendrickson, MD;  [facility];  Service: Open Heart Surgery;  Laterality: N/A;    Family History  Problem Relation Age of Onset  . Hypertension Mother   . Dementia Mother   . CVA Father   . Heart attack Maternal Grandmother     Allergies  Allergen Reactions  . Crestor [Rosuvastatin] Other (See Comments)    "Makes my legs hurt" - tolerates simvastatin     Outpatient Medications Prior to Visit  Medication Sig Dispense Refill  . aspirin 325 MG EC tablet Take  1 tablet (325 mg total) by mouth daily. 60 tablet 3  . cyclobenzaprine (FLEXERIL) 10 MG tablet TAKE 1 TABLET BY MOUTH 2 TIMES DAILY AS NEEDED FOR MUSCLE SPASMS. 60 tablet 1  . fish oil-omega-3 fatty acids 1000 MG capsule Take 1 g by mouth 2 (two) times daily.    . ramelteon (ROZEREM) 8 MG tablet Take 1 tablet (8 mg total) by mouth at bedtime. 30 tablet 0  . allopurinol (ZYLOPRIM) 300 MG tablet Take 1 tablet (300 mg total) by mouth daily. 30 tablet 6  . Colchicine 0.6 MG CAPS Take 2 capsules at the onset of gout flare, may repeat 1 capsule in 2  hours if symptoms persist 30 capsule 2  . gabapentin (NEURONTIN) 100 MG capsule Take 1 capsule (100 mg total) by mouth 3 (three) times daily. 90 capsule 6  . lisinopril (ZESTRIL) 10 MG tablet Take 1 tablet (10 mg total) by mouth daily. 30 tablet 2  . metFORMIN (GLUCOPHAGE) 500 MG tablet 2 tabs in the morning and one in the evening with food 270 tablet 1  . metoprolol tartrate (LOPRESSOR) 25 MG tablet Take 0.5 tablets (12.5 mg total) by mouth daily. 90 tablet 1  . omeprazole (PRILOSEC) 20 MG capsule Take 1 capsule (20 mg total) by mouth daily. Needs appt for more refills. 30 capsule 0  . predniSONE (DELTASONE) 20 MG tablet Take 1 tablet (20 mg total) by mouth 2 (two) times daily with a meal. 10 tablet 0  . simvastatin (ZOCOR) 20 MG tablet Take 1 tablet (20 mg total) by mouth at bedtime. 90 tablet 1  . traMADol (ULTRAM) 50 MG tablet Take 1 tablet (50 mg total) by mouth every 8 (eight) hours as needed. 30 tablet 0  . fluocinonide-emollient (LIDEX-E) 0.05 % cream Apply 1 application topically 2 (two) times daily. (Patient not taking: Reported on 07/06/2018) 30 g 0   No facility-administered medications prior to visit.      ROS Review of Systems  Constitutional: Negative for activity change and appetite change.  HENT: Negative for sinus pressure and sore throat.   Eyes: Negative for visual disturbance.  Respiratory: Negative for cough, chest tightness and shortness of breath.   Cardiovascular: Negative for chest pain and leg swelling.  Gastrointestinal: Negative for abdominal distention, abdominal pain, constipation and diarrhea.  Endocrine: Negative.   Genitourinary: Negative for dysuria.  Musculoskeletal: Negative for joint swelling and myalgias.  Skin: Positive for rash.  Allergic/Immunologic: Negative.   Neurological: Negative for weakness, light-headedness and numbness.  Psychiatric/Behavioral: Negative for dysphoric mood and suicidal ideas.    Objective:  BP 124/82   Pulse (!) 52    Ht 5\' 11"  (1.803 m)   Wt 176 lb (79.8 kg)   SpO2 98%   BMI 24.55 kg/m   BP/Weight 08/02/2018 07/06/2018 12/09/2017  Systolic BP 124 105 123  Diastolic BP 82 70 80  Wt. (Lbs) 176 173.6 174.6  BMI 24.55 24.21 24.35      Physical Exam Constitutional:      Appearance: He is well-developed.  Cardiovascular:     Rate and Rhythm: Normal rate.     Heart sounds: Normal heart sounds. No murmur.  Pulmonary:     Effort: Pulmonary effort is normal.     Breath sounds: Normal breath sounds. No wheezing or rales.  Chest:     Chest wall: No tenderness.  Abdominal:     General: Bowel sounds are normal. There is no distension.     Palpations: Abdomen is soft.  There is no mass.     Tenderness: There is no abdominal tenderness.  Musculoskeletal: Normal range of motion.  Skin:    Comments: Callus on anterior aspect of left sole  Neurological:     Mental Status: He is alert and oriented to person, place, and time.     CMP Latest Ref Rng & Units 07/06/2018 12/09/2017 08/26/2017  Glucose 65 - 99 mg/dL 79 93 109(H)  BUN 6 - 24 mg/dL 26(H) 30(H) 26(H)  Creatinine 0.76 - 1.27 mg/dL 1.53(H) 1.66(H) 1.56(H)  Sodium 134 - 144 mmol/L 137 138 139  Potassium 3.5 - 5.2 mmol/L 5.1 5.0 5.0  Chloride 96 - 106 mmol/L 102 98 102  CO2 20 - 29 mmol/L 22 24 23   Calcium 8.7 - 10.2 mg/dL 10.6(H) 10.4(H) 10.9(H)  Total Protein 6.0 - 8.5 g/dL 7.5 - 8.2  Total Bilirubin 0.0 - 1.2 mg/dL <0.2 - <0.2  Alkaline Phos 39 - 117 IU/L 86 - 94  AST 0 - 40 IU/L 29 - 20  ALT 0 - 44 IU/L 22 - 16    Lipid Panel     Component Value Date/Time   CHOL 269 (H) 05/19/2017 0855   TRIG 200 (H) 05/19/2017 0855   HDL 47 05/19/2017 0855   CHOLHDL 5.7 (H) 05/19/2017 0855   CHOLHDL 4.4 07/31/2015 1051   VLDL 20 07/31/2015 1051   LDLCALC 182 (H) 05/19/2017 0855    CBC    Component Value Date/Time   WBC 6.8 07/06/2018 1639   WBC 6.9 08/29/2015 1354   RBC 4.70 07/06/2018 1639   RBC 3.75 (L) 08/29/2015 1354   HGB 13.6  07/06/2018 1639   HCT 40.2 07/06/2018 1639   PLT 263 07/06/2018 1639   MCV 86 07/06/2018 1639   MCH 28.9 07/06/2018 1639   MCH 27.7 08/29/2015 1354   MCHC 33.8 07/06/2018 1639   MCHC 31.8 08/29/2015 1354   RDW 14.6 07/06/2018 1639   LYMPHSABS 2.8 07/06/2018 1639   MONOABS 660 07/31/2015 1051   EOSABS 0.3 07/06/2018 1639   BASOSABS 0.1 07/06/2018 1639    Lab Results  Component Value Date   HGBA1C 6.5 (H) 07/06/2018    Assessment & Plan:   1. Callus of foot - Ambulatory referral to Podiatry  2. Acute idiopathic gout, unspecified site Stable - allopurinol (ZYLOPRIM) 300 MG tablet; Take 1 tablet (300 mg total) by mouth daily.  Dispense: 30 tablet; Refill: 6 - Colchicine 0.6 MG CAPS; Take 2 capsules at the onset of gout flare, may repeat 1 capsule in 2 hours if symptoms persist  Dispense: 30 capsule; Refill: 2  3. S/P CABG (coronary artery bypass graft) Asymptomatic Risk factor modification  - simvastatin (ZOCOR) 20 MG tablet; Take 1 tablet (20 mg total) by mouth at bedtime.  Dispense: 90 tablet; Refill: 1  4. Heartburn Stable - omeprazole (PRILOSEC) 20 MG capsule; Take 1 capsule (20 mg total) by mouth daily. Needs appt for more refills.  Dispense: 30 capsule; Refill: 6  5. Essential hypertension, benign Controlled Counseled on blood pressure goal of less than 130/80, low-sodium, DASH diet, medication compliance, 150 minutes of moderate intensity exercise per week. Discussed medication compliance, adverse effects. - metoprolol tartrate (LOPRESSOR) 25 MG tablet; Take 0.5 tablets (12.5 mg total) by mouth daily.  Dispense: 90 tablet; Refill: 1 - lisinopril (ZESTRIL) 10 MG tablet; Take 1 tablet (10 mg total) by mouth daily.  Dispense: 30 tablet; Refill: 6  6. Type 2 diabetes mellitus with other neurologic complication, without long-term  current use of insulin (HCC) Controlled with A1c of 6.5 Counseled on Diabetic diet, my plate method, [id number] minutes of moderate intensity  exercise/week Keep blood sugar logs with fasting goals of 80-120 mg/dl, random of less than [id number] and in the event of sugars less than 60 mg/dl or greater than 098400 mg/dl please notify the clinic ASAP. It is recommended that you undergo annual eye exams and annual foot exams. Pneumonia vaccine is recommended. - POCT glucose (manual entry) - gabapentin (NEURONTIN) 100 MG capsule; Take 1 capsule (100 mg total) by mouth 3 (three) times daily.  Dispense: 90 capsule; Refill: 6 - metFORMIN (GLUCOPHAGE) 500 MG tablet; 2 tabs in the morning and one in the evening with food  Dispense: 270 tablet; Refill: 1  7. Screening for colon cancer - Ambulatory referral to Gastroenterology   Health Care Maintenance: Referred for colon cancer screening Meds ordered this encounter  Medications  . allopurinol (ZYLOPRIM) 300 MG tablet    Sig: Take 1 tablet (300 mg total) by mouth daily.    Dispense:  30 tablet    Refill:  6  . simvastatin (ZOCOR) 20 MG tablet    Sig: Take 1 tablet (20 mg total) by mouth at bedtime.    Dispense:  90 tablet    Refill:  1  . omeprazole (PRILOSEC) 20 MG capsule    Sig: Take 1 capsule (20 mg total) by mouth daily. Needs appt for more refills.    Dispense:  30 capsule    Refill:  6  . metoprolol tartrate (LOPRESSOR) 25 MG tablet    Sig: Take 0.5 tablets (12.5 mg total) by mouth daily.    Dispense:  90 tablet    Refill:  1  . gabapentin (NEURONTIN) 100 MG capsule    Sig: Take 1 capsule (100 mg total) by mouth 3 (three) times daily.    Dispense:  90 capsule    Refill:  6  . lisinopril (ZESTRIL) 10 MG tablet    Sig: Take 1 tablet (10 mg total) by mouth daily.    Dispense:  30 tablet    Refill:  6  . metFORMIN (GLUCOPHAGE) 500 MG tablet    Sig: 2 tabs in the morning and one in the evening with food    Dispense:  270 tablet    Refill:  1  . Colchicine 0.6 MG CAPS    Sig: Take 2 capsules at the onset of gout flare, may repeat 1 capsule in 2 hours if symptoms persist     Dispense:  30 capsule    Refill:  2    To replace tablet    Follow-up: Return in about 6 months (around 02/02/2019) for medical conditions.       Hoy RegisterEnobong Jordane Hisle, MD, FAAFP. [facility], [place] [phone]   08/02/2018, 9:06 AM

## 2018-08-03 ENCOUNTER — Encounter: Payer: Self-pay | Admitting: Gastroenterology

## 2018-08-05 ENCOUNTER — Ambulatory Visit: Payer: Medicaid Other | Admitting: Podiatry

## 2018-08-05 ENCOUNTER — Other Ambulatory Visit: Payer: Self-pay

## 2018-08-05 ENCOUNTER — Encounter: Payer: Self-pay | Admitting: Podiatry

## 2018-08-05 ENCOUNTER — Ambulatory Visit (INDEPENDENT_AMBULATORY_CARE_PROVIDER_SITE_OTHER): Payer: Medicaid Other

## 2018-08-05 VITALS — Temp 97.2°F

## 2018-08-05 DIAGNOSIS — M778 Other enthesopathies, not elsewhere classified: Secondary | ICD-10-CM

## 2018-08-05 DIAGNOSIS — M7752 Other enthesopathy of left foot: Secondary | ICD-10-CM | POA: Diagnosis not present

## 2018-08-05 DIAGNOSIS — L02612 Cutaneous abscess of left foot: Secondary | ICD-10-CM | POA: Diagnosis not present

## 2018-08-05 DIAGNOSIS — L02619 Cutaneous abscess of unspecified foot: Secondary | ICD-10-CM

## 2018-08-05 DIAGNOSIS — L03119 Cellulitis of unspecified part of limb: Secondary | ICD-10-CM

## 2018-08-05 MED ORDER — DOXYCYCLINE HYCLATE 100 MG PO TABS: 100.0000 mg | ORAL_TABLET | Freq: Two times a day (BID) | ORAL | 0 refills | Status: DC

## 2018-08-05 MED FILL — DOXYCYCLINE HYCLATE 100 MG: 100 | 10 days supply | Qty: 20 | Fill #0

## 2018-08-10 ENCOUNTER — Telehealth: Payer: Self-pay | Admitting: Family Medicine

## 2018-08-10 DIAGNOSIS — E1169 Type 2 diabetes mellitus with other specified complication: Secondary | ICD-10-CM

## 2018-08-10 MED ORDER — ACCU-CHEK FASTCLIX LANCETS MISC: 12 refills | Status: DC

## 2018-08-10 MED ORDER — ACCU-CHEK GUIDE VI STRP: ORAL_STRIP | 12 refills | Status: DC

## 2018-08-10 MED ORDER — ACCU-CHEK GUIDE W/DEVICE KIT: 1.0000 | PACK | Freq: Every day | 0 refills | Status: DC

## 2018-08-10 MED FILL — ACCU-CHEK FASTCLIX LANCETS: 90 days supply | Qty: 102 | Fill #0

## 2018-08-10 MED FILL — ACCU-CHEK GUIDE W/DEVICE KI: W/DEVICE | 1 days supply | Qty: 1 | Fill #0

## 2018-08-10 MED FILL — ACCU-CHEK GUIDE TEST STRIP: 50 days supply | Qty: 50 | Fill #0

## 2018-08-10 NOTE — Telephone Encounter (Signed)
1) Medication(s) Requested (by name): test strips  2) Pharmacy of Choice: [facility]  3) Special Requests:   Approved medications will be sent to the pharmacy, we will reach out if there is an issue.  Requests made after 3pm may not be addressed until the following business day!  If a patient is unsure of the name of the medication(s) please note and ask patient to call back when they are able to provide all info, do not send to responsible party until all information is available!

## 2018-08-10 NOTE — Progress Notes (Signed)
Subjective:   Patient ID: Willie Walker, male   DOB: 53 y.o.   MRN: [id number]   HPI Patient presents with quite a bit of pain on the plantar aspect of the left foot with a lesion that is formed.  He states it is just tender is not noted any drainage currently and he is trying to keep his sugar under good control at this time   ROS      Objective:  Physical Exam  Neurovascular status unchanged from previous visits with diminishment of sharp dull vibratory noted.  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  Debridement accomplished of the lesion and I did note that it is localized and I did not note that there is any proximal edema erythema drainage with localized low-grade breakdown of tissue but nothing to culture currently.  There was no odor noted currently and patient has no systemic signs of infection.  At this point I did go ahead using sterile sharp debridement I did debride this I did note there is a slight abscess of the tissue and I flushed the area and applied sterile dressing with offloading.  I instructed on soaks at home and I placed him on doxycycline and I gave him strict instructions of any redness drainage or any indications of infection were to occur he is to contact us immediately and if any systemic signs of infection were to occur he is to go straight to the emergency room.  I encouraged him to offload the area and explained to him how to do this and he will be seen back  X-rays were negative for any signs of ostial lysis or any other bony pathology associated with condition

## 2018-08-15 ENCOUNTER — Other Ambulatory Visit: Payer: Self-pay

## 2018-08-15 ENCOUNTER — Ambulatory Visit: Payer: Medicaid Other | Admitting: *Deleted

## 2018-08-15 VITALS — Ht 71.0 in | Wt 174.0 lb

## 2018-08-15 DIAGNOSIS — Z1211 Encounter for screening for malignant neoplasm of colon: Secondary | ICD-10-CM

## 2018-08-15 MED ORDER — SUPREP BOWEL PREP KIT 17.5-3.13-1.6 GM/177ML PO SOLN: 1.0000 | Freq: Once | ORAL | 0 refills | Status: AC

## 2018-08-15 MED FILL — SUPREP BOWEL PREP KIT: 17.5-3.13-1 | 1 days supply | Qty: 354 | Fill #0

## 2018-08-15 NOTE — Progress Notes (Signed)
No egg or soy allergy known to patient  No issues with past sedation with any surgeries  or procedures, no intubation problems  No diet pills per patient No home 02 use per patient  No blood thinners per patient  Pt denies issues with constipation  No A fib or A flutter  EMMI video sent to pt's e mail   Pt verified name, DOB, address and insurance during PV today. Pt mailed instruction packet to included paper to complete and mail back to [facility] with addressed and stamped envelope, Emmi video, copy of consent form to read and not return, and instructions.PV completed over the phone. Pt encouraged to call with questions or issues   Pt is aware that care partner will wait in the car during procedure; if they feel like they will be too hot to wait in the car; they may wait in the lobby.  We want them to wear a mask (we do not have any that we can provide them), practice social distancing, and we will check their temperatures when they get here.  I did remind patient that their care partner needs to stay in the parking lot the entire time. Pt will wear mask into building.  

## 2018-08-19 ENCOUNTER — Ambulatory Visit: Payer: Medicaid Other | Admitting: Podiatry

## 2018-08-19 ENCOUNTER — Encounter: Payer: Self-pay | Admitting: Podiatry

## 2018-08-19 ENCOUNTER — Other Ambulatory Visit: Payer: Self-pay

## 2018-08-19 VITALS — Temp 97.2°F

## 2018-08-19 DIAGNOSIS — L03119 Cellulitis of unspecified part of limb: Secondary | ICD-10-CM | POA: Diagnosis not present

## 2018-08-19 DIAGNOSIS — E119 Type 2 diabetes mellitus without complications: Secondary | ICD-10-CM

## 2018-08-19 DIAGNOSIS — L02619 Cutaneous abscess of unspecified foot: Secondary | ICD-10-CM

## 2018-08-19 NOTE — Progress Notes (Signed)
Subjective:   Patient ID: Willie Walker, male   DOB: 53 y.o.   MRN: [id number]   HPI Patient states I do seem to be doing a lot better not been trying to watch my sugar currently   ROS      Objective:  Physical Exam  Neurovascular status intact with patient's plantar left showing keratotic tissue formation that is localized with no drainage or other pathology associated with it     Assessment:  At risk diabetic who had a breakdown of tissue which appears to have healed well with lesion formation that is localized with no drainage redness or swelling     Plan:  Educated patient on diabetes risk factors and take a daily look at this and any drainage were to recur or other changes to let us know immediately and today gentle debridement accomplished with no tissue exposure noted upon doing this

## 2018-08-26 ENCOUNTER — Telehealth: Payer: Self-pay | Admitting: Gastroenterology

## 2018-08-26 NOTE — Telephone Encounter (Signed)
Left message on vmail to call back regarding Covid-19 screening questions. °Covid-19 Screening Questions: ° °Do you now or have you had a fever in the last 14 days?  ° °Do you have any respiratory symptoms of shortness of breath or cough now or in the last 14 days?  ° °Do you have any family members or close contacts with diagnosed or suspected Covid-19 in the past 14 days?  ° °Have you been tested for Covid-19 and found to be positive?  ° ° °

## 2018-08-26 NOTE — Telephone Encounter (Signed)
Dr. Margarita Rana, I wanted to let you know that Osa Castilla cancelled his procedure and chose not to reschedule at this time. Thanks so much for the referral.  [place]

## 2018-08-26 NOTE — Telephone Encounter (Signed)
Dr. Tarri Glenn, pt called to cancel his colonoscopy scheduled on Monday 8/10 at 9:30 AM.  He reported that he did not have his prep soln and "was not aware that he was supposed to get it."    I advised him to call pharmacy to check, but pt declined and "just want to cancel."  Pt declined to reschedule.

## 2018-08-29 ENCOUNTER — Encounter: Payer: Medicaid Other | Admitting: Gastroenterology

## 2018-08-29 NOTE — Telephone Encounter (Signed)
Thank you for letting me know

## 2018-08-31 MED FILL — METOPROLOL TARTRATE 25 MG T: 25 | 30 days supply | Qty: 15 | Fill #1

## 2018-09-19 ENCOUNTER — Encounter: Payer: Self-pay | Admitting: Podiatry

## 2018-09-19 ENCOUNTER — Other Ambulatory Visit: Payer: Self-pay

## 2018-09-19 ENCOUNTER — Ambulatory Visit: Payer: Medicaid Other | Admitting: Podiatry

## 2018-09-19 DIAGNOSIS — Q6692 Congenital deformity of feet, unspecified, left foot: Secondary | ICD-10-CM

## 2018-09-19 DIAGNOSIS — L989 Disorder of the skin and subcutaneous tissue, unspecified: Secondary | ICD-10-CM

## 2018-09-19 DIAGNOSIS — E119 Type 2 diabetes mellitus without complications: Secondary | ICD-10-CM

## 2018-09-19 NOTE — Progress Notes (Signed)
Subjective:   Patient ID: Willie Walker, male   DOB: 53 y.o.   MRN: [id number]   HPI Patient presents concerned around discoloration with mild discomfort plantar left foot with patient being diabetic   ROS      Objective:  Physical Exam  Neurovascular status intact with moderate skin reactivity sub-third metatarsal left localized with no proximal edema or edema drainage     Assessment:  Probability that this is more related to structural pressure and does not appear to be broken down     Plan:  Education rendered careful debridement accomplished no breakdown of tissue noted and discussed continue padding for the area and may require other treatments depending on response

## 2018-09-28 NOTE — Progress Notes (Signed)
Cardiology Office Note    Date:  10/06/2018   ID:  Willie Walker, DOB 09-22-1965, MRN [id number]  PCP:  Charlott Rakes, MD  Cardiologist: Dr. Johnsie Cancel  CC: CAD/CABG   History of Present Illness:  Willie Walker is a 53 y.o. male with a history of CAD s/p multiple PCIs and CABG x5V (07/2015), HTN, HLD, PVD and  polysubstance abuse Last EF in 2017 45-50%  History of CAD and multiple PCI procedures in the past. He had several stents placed in [place] with full metal jacket in the RCA and stent to the intermediate. LHC (8/292013) for chest pain showed:  dLM 30%, mLAD 60%, dLAD 40%, IM 30% ISR, D1 40-50%, OM/AV groove branch 95% ISR, pRCA 90% SIR, dRCA 80% ISR, EF 60%. Subsequently underwent cutting Balloon angioplasty to the mid RCA; Cutting Balloon angioplasty to the mid circumflex.   More angina in 2017 Cath by Dr. Martinique.  revealed critical 3 vessel obstructive CAD and he was referred for CABG.  He was admitted from 7/13-7/22/17. He underwent CABG x5V utilizing LIMA to LAD, SVG to Diagonal, Sequential SVG to Ramus Intermediate and OM, and Radial artery to the PDA on 08/05/15 with Dr. Roxan Hockey. He also underwent open harvest of his left radial artery and endoscopic harvest of greater saphenous vein from right thigh. His hospital course was complicated by pericarditis  And elevated Cr with ACE  Has had issues with gout in feet.    No cardiac issues Mother living with him at home [employer] for the [employer] in basketball    Past Medical History:  Diagnosis Date  . Allergy   . Anginal pain ([place])   . Anxiety   . Arthritis   . Childhood asthma   . Chronic lower back pain   . Coronary artery disease   . ELECTROCARDIOGRAM, ABNORMAL   . GERD (gastroesophageal reflux disease)   . Gout   . Headache(784.0) 09/18/2011   "maybe twice/wk; pressure on the brain"  . High cholesterol   . Hypertension   . Kidney stones   . Lower GI bleed 09/18/2011   "clots and everything; not lately"  .  Myocardial infarction ([place]) 10/2005  . Peripheral vascular disease (H[facility])    LLE  . Pneumonia 1990's  . Seizures ([place]) 09/18/2011   "blanks out on me"/wife's report- pt states never had a seizure- said he would black out from pain yrs ago but never had a seizure -  08-15-2018  . Shortness of breath 09/18/11   "@ rest; lying down; w/exertion"  . Type I diabetes mellitus ([place])     Past Surgical History:  Procedure Laterality Date  . [place]   "mugged"  . CARDIAC CATHETERIZATION N/A 08/01/2015   Procedure: Left Heart Cath and Coronary Angiography;  Surgeon: Frazier Balfour M Martinique, MD;  Location: [place] [facility];  Service: Cardiovascular;  Laterality: N/A;  . CORONARY ANGIOPLASTY  09/18/2011  . CORONARY ANGIOPLASTY WITH STENT PLACEMENT  10/2005   "9"  . CORONARY ARTERY BYPASS GRAFT N/A 08/05/2015   Procedure: CORONARY ARTERY BYPASS GRAFTING (CABG), ON PUMP, TIMES FIVE, USING LEFT INTERNAL MAMMARY ARTERY AND LEFT RADIAL ARTERY, RIGHT GREATER SAPHENOUS VEIN HARVESTED ENDOSCOPICALLY;  Surgeon: Melrose Nakayama, MD;  Location: [place];  Service: Open Heart Surgery;  Laterality: N/A;  . [place] "mugging"  . PERCUTANEOUS CORONARY STENT INTERVENTION (PCI-S) N/A 09/18/2011   Procedure: PERCUTANEOUS CORONARY STENT INTERVENTION (PCI-S);  Surgeon: Ander Slade  Martinique, MD;  Location: [facility];  Service: Cardiovascular;  Laterality: N/A;  . RADIAL ARTERY HARVEST Left 08/05/2015   Procedure: RADIAL ARTERY HARVEST;  Surgeon: Melrose Nakayama, MD;  Location: [place];  Service: Open Heart Surgery;  Laterality: Left;  . TEE WITHOUT CARDIOVERSION N/A 08/05/2015   Procedure: TRANSESOPHAGEAL ECHOCARDIOGRAM (TEE);  Surgeon: Melrose Nakayama, MD;  Location: [place];  Service: Open Heart Surgery;  Laterality: N/A;    Current Medications: Outpatient Medications Prior to Visit  Medication Sig Dispense Refill  . Accu-Chek FastClix Lancets MISC Use as directed to test blood sugar  once daily 102 each 12  . allopurinol (ZYLOPRIM) 300 MG tablet Take 1 tablet (300 mg total) by mouth daily. 30 tablet 6  . aspirin 325 MG EC tablet Take 1 tablet (325 mg total) by mouth daily. 60 tablet 3  . Blood Glucose Monitoring Suppl (ACCU-CHEK GUIDE) w/Device KIT 1 each by Does not apply route daily. 1 kit 0  . Colchicine 0.6 MG CAPS Take 2 capsules at the onset of gout flare, may repeat 1 capsule in 2 hours if symptoms persist 30 capsule 2  . cyclobenzaprine (FLEXERIL) 10 MG tablet TAKE 1 TABLET BY MOUTH 2 TIMES DAILY AS NEEDED FOR MUSCLE SPASMS. 60 tablet 1  . fish oil-omega-3 fatty acids 1000 MG capsule Take 1 g by mouth 2 (two) times daily.    . fluocinonide-emollient (LIDEX-E) 0.05 % cream Apply 1 application topically 2 (two) times daily. 30 g 0  . gabapentin (NEURONTIN) 100 MG capsule Take 1 capsule (100 mg total) by mouth 3 (three) times daily. 90 capsule 6  . glucose blood (ACCU-CHEK GUIDE) test strip Use as instructed to test blood sugar once daily 100 each 12  . lisinopril (ZESTRIL) 10 MG tablet Take 1 tablet (10 mg total) by mouth daily. 30 tablet 6  . metFORMIN (GLUCOPHAGE) 500 MG tablet 2 tabs in the morning and one in the evening with food 270 tablet 1  . metoprolol tartrate (LOPRESSOR) 25 MG tablet Take 0.5 tablets (12.5 mg total) by mouth daily. 90 tablet 1  . omeprazole (PRILOSEC) 20 MG capsule Take 1 capsule (20 mg total) by mouth daily. Needs appt for more refills. 30 capsule 6  . ramelteon (ROZEREM) 8 MG tablet Take 1 tablet (8 mg total) by mouth at bedtime. 30 tablet 0  . simvastatin (ZOCOR) 20 MG tablet Take 1 tablet (20 mg total) by mouth at bedtime. 90 tablet 1   No facility-administered medications prior to visit.      Allergies:   Crestor [rosuvastatin]   Social History   Socioeconomic History  . Marital status: Divorced    Spouse name: Not on file  . Number of children: Not on file  . Years of education: Not on file  . Highest education level: Not on  file  Occupational History  . Not on file  Social Needs  . Financial resource strain: Not on file  . Food insecurity    Worry: Not on file    Inability: Not on file  . Transportation needs    Medical: Not on file    Non-medical: Not on file  Tobacco Use  . Smoking status: Former Smoker    Packs/day: 0.00    Years: 20.00    Pack years: 0.00    Types: Cigarettes    Quit date: 11/02/2005    Years since quitting: 12.9  . Smokeless tobacco: Never Used  Substance and Sexual Activity  . Alcohol  use: Yes    Alcohol/week: 4.0 standard drinks    Types: 4 Shots of liquor per week    Comment: 08/01/2015  "coulple shots a couple times/week "  . Drug use: Yes    Types: Marijuana    Comment: 08/01/2015 "did some coke 07/23/2015; nothing since 2010 before that"  . Sexual activity: Not Currently  Lifestyle  . Physical activity    Days per week: Not on file    Minutes per session: Not on file  . Stress: Not on file  Relationships  . Social Herbalist on phone: Not on file    Gets together: Not on file    Attends religious service: Not on file    Active member of club or organization: Not on file    Attends meetings of clubs or organizations: Not on file    Relationship status: Not on file  Other Topics Concern  . Not on file  Social History Narrative  . Not on file     Family History:  The patient's family history includes CVA in his father; Dementia in his mother; Heart attack in his maternal grandmother; Hypertension in his mother.     ROS:   Please see the history of present illness.    ROS All other systems reviewed and are negative.   PHYSICAL EXAM:   VS:  BP 110/74   Pulse (!) 59   Ht _0  (1.803 m)   Wt 177 lb 6.4 oz (80.5 kg)   SpO2 99%   BMI 24.74 kg/m    Affect appropriate Healthy:  appears stated age 77: normal Neck supple with no adenopathy JVP normal no bruits no thyromegaly Lungs clear with no wheezing and good diaphragmatic motion Heart:   S1/S2 no murmur, no rub, gallop or click PMI normal post sternotomy  Abdomen: benighn, BS positve, no tenderness, no AAA no bruit.  No HSM or HJR Left radial harvest  No edema Neuro non-focal Skin warm and dry No muscular weakness   Wt Readings from Last 3 Encounters:  10/06/18 177 lb 6.4 oz (80.5 kg)  08/15/18 174 lb (78.9 kg)  08/02/18 176 lb (79.8 kg)      Studies/Labs Reviewed:   EKG:  08/30/15  NSR HR 70 with non specific ST/TW abnormalities.   Recent Labs: 07/06/2018: ALT 22; BUN 26; Creatinine, Ser 1.53; Hemoglobin 13.6; Platelets 263; Potassium 5.1; Sodium 137   Lipid Panel    Component Value Date/Time   CHOL 269 (H) 05/19/2017 0855   TRIG 200 (H) 05/19/2017 0855   HDL 47 05/19/2017 0855   CHOLHDL 5.7 (H) 05/19/2017 0855   CHOLHDL 4.4 07/31/2015 1051   VLDL 20 07/31/2015 1051   LDLCALC 182 (H) 05/19/2017 0855    Additional studies/ records that were reviewed today include:  LHC 08/01/15 Conclusion   Ost RCA to Mid RCA lesion, 40% stenosed. The lesion was previously treated with a drug-eluting stent greater than two years ago.  Mid RCA to Dist RCA lesion, 100% stenosed. The lesion was previously treated with a drug-eluting stent greater than two years ago.  Acute Mrg lesion, 90% stenosed.  [place] lesion, 40% stenosed. The lesion was previously treated with a drug-eluting stent greater than two years ago.  Prox Cx to Mid Cx lesion, 100% stenosed. The lesion was previously treated with a drug-eluting stent greater than two years ago.  Ost 2nd Diag to 2nd Diag lesion, 70% stenosed.  Mid LAD to Dist LAD lesion, 50% stenosed.  Prox LAD to Mid LAD lesion, 95% stenosed.  The left ventricular systolic function is normal.   1. Critical 3 vessel obstructive CAD 2. Normal LV function  Plan: Admit telemetry. Start IV heparin and Ntg. CT surgery consult for CABG.    2D ECHO: 08/01/2015 LV EF: 45% -   50% Study Conclusions - Left ventricle: The cavity size  was normal. Wall thickness was   normal. Systolic function was mildly reduced. The estimated   ejection fraction was in the range of 45% to 50%. Diffuse   hypokinesis. Left ventricular diastolic function parameters were   normal. - Aorta: Aortic root dimension: 38 mm (ED). - Ascending aorta: The ascending aorta was mildly dilated.    ASSESSMENT & PLAN:   CAD: s/p multiple PCI procedures in the past and now CAGB x5V 2017 . Continue ASA 343m daily, statin and BB.   Polysubstance abuse: cocaine and mariajuana abstaining   HLD: on statin needs repeat labs on zocor had myalgias with crestor  ABI's normal 2019  DMT2: Discussed low carb diet.  Target hemoglobin A1c is 6.5 or less.  Continue current medications.   Mild LV dysfunction: EF 45-50%. TTE 07/2015 no signs of CHF observe history of elevated Cr with ACe Will check renal duplex   Gout:  Avoid diuretics worse in feet on allopurinol per primary uric acid level normal 12/09/17    Tests: Fasting lipid and liver on statin HLD Renal Duplex HTN elevated Cr with ACE   F/U in a year  [employer]

## 2018-10-01 DIAGNOSIS — Z03818 Encounter for observation for suspected exposure to other biological agents ruled out: Secondary | ICD-10-CM | POA: Diagnosis not present

## 2018-10-06 ENCOUNTER — Encounter: Payer: Self-pay | Admitting: Cardiovascular Disease

## 2018-10-06 ENCOUNTER — Other Ambulatory Visit: Payer: Self-pay

## 2018-10-06 ENCOUNTER — Ambulatory Visit (INDEPENDENT_AMBULATORY_CARE_PROVIDER_SITE_OTHER): Payer: Medicaid Other | Admitting: Cardiovascular Disease

## 2018-10-06 VITALS — BP 110/74 | HR 59 | Ht 71.0 in | Wt 177.4 lb

## 2018-10-06 DIAGNOSIS — Z951 Presence of aortocoronary bypass graft: Secondary | ICD-10-CM | POA: Diagnosis not present

## 2018-10-06 NOTE — Patient Instructions (Addendum)

## 2018-10-10 MED FILL — OMEPRAZOLE 20 MG CAP: 20 | 30 days supply | Qty: 30 | Fill #1

## 2018-10-10 MED FILL — LISINOPRIL 10 MG TABS: 10 | 30 days supply | Qty: 30 | Fill #1

## 2018-10-10 MED FILL — METOPROLOL TARTRATE 25 MG T: 25 | 30 days supply | Qty: 15 | Fill #1

## 2018-10-10 MED FILL — MITIGARE 0.6 MG CAPSULE: 0.6 | 10 days supply | Qty: 30 | Fill #1

## 2018-11-08 MED FILL — OMEPRAZOLE 20 MG CAP: 20 | 30 days supply | Qty: 30 | Fill #2

## 2018-11-08 MED FILL — LISINOPRIL 10 MG TABS: 10 | 30 days supply | Qty: 30 | Fill #2

## 2018-11-08 MED FILL — metFORMIN HCL 500 MG TABS: 500 | 30 days supply | Qty: 90 | Fill #1

## 2018-11-08 MED FILL — METOPROLOL TARTRATE 25 MG T: 25 | 30 days supply | Qty: 15 | Fill #2

## 2018-11-11 DIAGNOSIS — M109 Gout, unspecified: Secondary | ICD-10-CM | POA: Diagnosis not present

## 2018-11-11 DIAGNOSIS — Z1211 Encounter for screening for malignant neoplasm of colon: Secondary | ICD-10-CM | POA: Diagnosis not present

## 2018-11-11 DIAGNOSIS — N529 Male erectile dysfunction, unspecified: Secondary | ICD-10-CM | POA: Diagnosis not present

## 2018-11-11 DIAGNOSIS — E119 Type 2 diabetes mellitus without complications: Secondary | ICD-10-CM | POA: Diagnosis not present

## 2018-12-02 DIAGNOSIS — Z23 Encounter for immunization: Secondary | ICD-10-CM | POA: Diagnosis not present

## 2018-12-02 DIAGNOSIS — M1990 Unspecified osteoarthritis, unspecified site: Secondary | ICD-10-CM | POA: Diagnosis not present

## 2018-12-02 DIAGNOSIS — E119 Type 2 diabetes mellitus without complications: Secondary | ICD-10-CM | POA: Diagnosis not present

## 2018-12-02 DIAGNOSIS — M109 Gout, unspecified: Secondary | ICD-10-CM | POA: Diagnosis not present

## 2018-12-21 MED FILL — OMEPRAZOLE 20 MG CAP: 20 | 30 days supply | Qty: 30 | Fill #3

## 2018-12-21 MED FILL — METOPROLOL TARTRATE 25 MG T: 25 | 30 days supply | Qty: 15 | Fill #3

## 2018-12-21 MED FILL — LISINOPRIL 10 MG TABS: 10 | 30 days supply | Qty: 30 | Fill #3

## 2018-12-21 MED FILL — MITIGARE 0.6 MG CAPSULE: 0.6 | 10 days supply | Qty: 30 | Fill #2

## 2018-12-21 MED FILL — metFORMIN HCL 500 MG TABS: 500 | 30 days supply | Qty: 90 | Fill #2

## 2019-01-26 DIAGNOSIS — M109 Gout, unspecified: Secondary | ICD-10-CM | POA: Diagnosis not present

## 2019-01-26 DIAGNOSIS — K219 Gastro-esophageal reflux disease without esophagitis: Secondary | ICD-10-CM | POA: Diagnosis not present

## 2019-01-26 DIAGNOSIS — E119 Type 2 diabetes mellitus without complications: Secondary | ICD-10-CM | POA: Diagnosis not present

## 2019-01-26 DIAGNOSIS — E785 Hyperlipidemia, unspecified: Secondary | ICD-10-CM | POA: Diagnosis not present

## 2019-01-31 ENCOUNTER — Ambulatory Visit: Payer: Medicaid Other | Admitting: Orthopaedic Surgery

## 2019-02-06 ENCOUNTER — Ambulatory Visit: Payer: Medicaid Other | Admitting: Family Medicine

## 2019-02-07 ENCOUNTER — Ambulatory Visit (INDEPENDENT_AMBULATORY_CARE_PROVIDER_SITE_OTHER): Payer: Medicaid Other | Admitting: Orthopaedic Surgery

## 2019-02-07 ENCOUNTER — Encounter: Payer: Self-pay | Admitting: Orthopaedic Surgery

## 2019-02-07 ENCOUNTER — Other Ambulatory Visit: Payer: Self-pay

## 2019-02-07 DIAGNOSIS — M25511 Pain in right shoulder: Secondary | ICD-10-CM | POA: Diagnosis not present

## 2019-02-07 DIAGNOSIS — G8929 Other chronic pain: Secondary | ICD-10-CM

## 2019-02-07 NOTE — Addendum Note (Signed)
Addended by: Albertina Parr on: 02/07/2019 10:35 AM   Modules accepted: Orders

## 2019-02-07 NOTE — Progress Notes (Signed)
Office Visit Note   Patient: Willie Walker           Date of Birth: 1965-06-02           MRN: [id number] Visit Date: 02/07/2019              Requested by: Willie Register, MD [address] [place],  [place] [address] PCP: Willie Register, MD   Assessment & Plan: Visit Diagnoses:  1. Chronic right shoulder pain     Plan: My impression is chronic right shoulder pain without relief from prior cortisone injection.  At this point we will need to obtain an MRI to rule out structural abnormalities.  Follow-up after the MRI.  Follow-Up Instructions: Return in about 10 days (around 02/17/2019).   Orders:  No orders of the defined types were placed in this encounter.  No orders of the defined types were placed in this encounter.     Procedures: No procedures performed   Clinical Data: No additional findings.   Subjective: Chief Complaint  Patient presents with  . Right Shoulder - Pain    Mr. Willie Walker returns today for follow-up of chronic right shoulder pain.  We saw him in June 2020 and he received an [facility] joint injection which did not help significantly.  He continues to have pain throughout his right shoulder that sometimes radiates up into the neck.  Denies any radiculopathy.   Review of Systems  Constitutional: Negative.   All other systems reviewed and are negative.    Objective: Vital Signs: There were no vitals taken for this visit.  Physical Exam Vitals and nursing note reviewed.  Constitutional:      Appearance: He is well-developed.  Pulmonary:     Effort: Pulmonary effort is normal.  Abdominal:     Palpations: Abdomen is soft.  Skin:    General: Skin is warm.  Neurological:     Mental Status: He is alert and oriented to person, place, and time.  Psychiatric:        Behavior: Behavior normal.        Thought Content: Thought content normal.        Judgment: Judgment normal.     Ortho Exam Right shoulder exam shows moderate pain with range of  motion.  Rotator cuff is grossly intact to manual muscle testing with moderate pain.  Moderate pain with speeds test. Specialty Comments:  No specialty comments available.  Imaging: No results found.   PMFS History: Patient Active Problem List   Diagnosis Date Noted  . Blurry vision, bilateral 10/21/2016  . Memory loss 10/21/2016  . Neuropathic pain of left foot 05/20/2016  . Adjustment insomnia 04/22/2016  . Post-op pain 02/19/2016  . Otitis media 02/19/2016  . 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  . Type 2 diabetes mellitus without complication, without long-term current use of insulin (HCC) 07/25/2015  . Renal cyst 07/25/2015  . Essential hypertension, benign 11/01/2014  . Primary gout 11/01/2014  . Polyneuropathy in diabetes(357.2) 07/28/2012  . Diabetic foot ulcer associated with type 2 diabetes mellitus (HCC) 07/28/2012  . Disc disease, degenerative, lumbar or lumbosacral 07/28/2012  . Heel ulcer, right 06/20/2012  . Type II or unspecified type diabetes mellitus without mention of complication, not stated as uncontrolled 06/20/2012  . Erectile dysfunction associated with type 2 diabetes mellitus (HCC) 03/10/2012  . CAD (coronary artery disease) 01/08/2012  . Pancreatitis 01/08/2012  . Unstable angina (HCC) 09/19/2011  . Type 2 diabetes mellitus (HCC) 09/09/2011  .  Mixed hyperlipidemia 09/09/2011  . HTN (hypertension) 09/09/2011  . ELECTROCARDIOGRAM, ABNORMAL 12/12/2008   Past Medical History:  Diagnosis Date  . Allergy   . Anginal pain ([place])   . Anxiety   . Arthritis   . Childhood asthma   . Chronic lower back pain   . Coronary artery disease   . ELECTROCARDIOGRAM, ABNORMAL   . GERD (gastroesophageal reflux disease)   . Gout   . Headache(784.0) 09/18/2011   "maybe twice/wk; pressure on the brain"  . High cholesterol   . Hypertension   . Kidney stones   . Lower GI bleed 09/18/2011   "clots and everything; not lately"  . Myocardial infarction  ([place]) 10/2005  . Peripheral vascular disease (HCC)    LLE  . Pneumonia 1990's  . Seizures ([place]) 09/18/2011   "blanks out on me"/wife's report- pt states never had a seizure- said he would black out from pain yrs ago but never had a seizure -  08-15-2018  . Shortness of breath 09/18/11   "@ rest; lying down; w/exertion"  . Type I diabetes mellitus (HCC)     Family History  Problem Relation Age of Onset  . Hypertension Mother   . Dementia Mother   . CVA Father   . Heart attack Maternal Grandmother   . Colon cancer Neg Hx   . Colon polyps Neg Hx   . Esophageal cancer Neg Hx   . Rectal cancer Neg Hx   . Stomach cancer Neg Hx     Past Surgical History:  Procedure Laterality Date  . [place]   "mugged"  . CARDIAC CATHETERIZATION N/A 08/01/2015   Procedure: Left Heart Cath and Coronary Angiography;  Surgeon: Peter M Martinique, MD;  Location: [place] [facility];  Service: Cardiovascular;  Laterality: N/A;  . CORONARY ANGIOPLASTY  09/18/2011  . CORONARY ANGIOPLASTY WITH STENT PLACEMENT  10/2005   "9"  . CORONARY ARTERY BYPASS GRAFT N/A 08/05/2015   Procedure: CORONARY ARTERY BYPASS GRAFTING (CABG), ON PUMP, TIMES FIVE, USING LEFT INTERNAL MAMMARY ARTERY AND LEFT RADIAL ARTERY, RIGHT GREATER SAPHENOUS VEIN HARVESTED ENDOSCOPICALLY;  Surgeon: Melrose Nakayama, MD;  Location: [place];  Service: Open Heart Surgery;  Laterality: N/A;  . [place] "mugging"  . PERCUTANEOUS CORONARY STENT INTERVENTION (PCI-S) N/A 09/18/2011   Procedure: PERCUTANEOUS CORONARY STENT INTERVENTION (PCI-S);  Surgeon: Peter M Martinique, MD;  Location: [facility] CATH LAB;  Service: Cardiovascular;  Laterality: N/A;  . RADIAL ARTERY HARVEST Left 08/05/2015   Procedure: RADIAL ARTERY HARVEST;  Surgeon: Melrose Nakayama, MD;  Location: [place];  Service: Open Heart Surgery;  Laterality: Left;  . TEE WITHOUT CARDIOVERSION N/A 08/05/2015   Procedure: TRANSESOPHAGEAL ECHOCARDIOGRAM (TEE);  Surgeon:  Melrose Nakayama, MD;  Location: [place];  Service: Open Heart Surgery;  Laterality: N/A;   Social History   Occupational History  . Not on file  Tobacco Use  . Smoking status: Former Smoker    Packs/day: 0.00    Years: 20.00    Pack years: 0.00    Types: Cigarettes    Quit date: 11/02/2005    Years since quitting: 13.2  . Smokeless tobacco: Never Used  Substance and Sexual Activity  . Alcohol use: Yes    Alcohol/week: 4.0 standard drinks    Types: 4 Shots of liquor per week    Comment: 08/01/2015  "coulple shots a couple times/week "  . Drug use: Yes    Types: Marijuana  Comment: 08/01/2015 "did some coke 07/23/2015; nothing since 2010 before that"  . Sexual activity: Not Currently

## 2019-02-21 ENCOUNTER — Other Ambulatory Visit: Payer: Medicaid Other

## 2019-02-23 ENCOUNTER — Other Ambulatory Visit: Payer: Self-pay | Admitting: Family Medicine

## 2019-02-23 DIAGNOSIS — M1000 Idiopathic gout, unspecified site: Secondary | ICD-10-CM

## 2019-02-23 MED FILL — METOPROLOL TARTRATE 25 MG T: 25 | 30 days supply | Qty: 15 | Fill #4

## 2019-02-23 MED FILL — LISINOPRIL 10 MG TABS: 10 | 30 days supply | Qty: 30 | Fill #4

## 2019-02-23 MED FILL — OMEPRAZOLE 20 MG CAP: 20 | 30 days supply | Qty: 30 | Fill #4

## 2019-02-23 MED FILL — MITIGARE 0.6 MG CAPSULE: 0.6 | 15 days supply | Qty: 30 | Fill #0

## 2019-02-27 ENCOUNTER — Other Ambulatory Visit: Payer: Self-pay

## 2019-02-27 ENCOUNTER — Ambulatory Visit
Admission: RE | Admit: 2019-02-27 | Discharge: 2019-02-27 | Disposition: A | Discharge status: Home / Self Care | Payer: Medicaid Other | Source: Ambulatory Visit | Attending: Orthopaedic Surgery | Admitting: Orthopaedic Surgery

## 2019-02-27 DIAGNOSIS — G8929 Other chronic pain: Secondary | ICD-10-CM

## 2019-02-27 DIAGNOSIS — M25511 Pain in right shoulder: Secondary | ICD-10-CM

## 2019-02-28 ENCOUNTER — Ambulatory Visit (INDEPENDENT_AMBULATORY_CARE_PROVIDER_SITE_OTHER): Payer: Medicaid Other | Admitting: Orthopaedic Surgery

## 2019-02-28 ENCOUNTER — Encounter: Payer: Self-pay | Admitting: Orthopaedic Surgery

## 2019-02-28 ENCOUNTER — Other Ambulatory Visit: Payer: Self-pay

## 2019-02-28 ENCOUNTER — Other Ambulatory Visit: Payer: Self-pay | Admitting: Family Medicine

## 2019-02-28 ENCOUNTER — Ambulatory Visit: Payer: Self-pay

## 2019-02-28 ENCOUNTER — Telehealth: Payer: Self-pay | Admitting: Radiology

## 2019-02-28 DIAGNOSIS — G8929 Other chronic pain: Secondary | ICD-10-CM

## 2019-02-28 DIAGNOSIS — M19019 Primary osteoarthritis, unspecified shoulder: Secondary | ICD-10-CM

## 2019-02-28 DIAGNOSIS — M19011 Primary osteoarthritis, right shoulder: Secondary | ICD-10-CM

## 2019-02-28 DIAGNOSIS — M25511 Pain in right shoulder: Secondary | ICD-10-CM | POA: Diagnosis not present

## 2019-02-28 MED ORDER — HYDROCODONE-ACETAMINOPHEN 5-325 MG PO TABS: 1.0000 | ORAL_TABLET | Freq: Four times a day (QID) | ORAL | 0 refills | Status: DC | PRN

## 2019-02-28 NOTE — Telephone Encounter (Signed)
Aud[place] with [facility] states that he received a rx for Hydrocodone and they do not fill that medication.

## 2019-02-28 NOTE — Progress Notes (Signed)
Subjective: Patient is here for ultrasound-guided right AC joint injection.  Objective:  Very tender directly over the ACJ.  Procedure: Ultrasound-guided right ACJ injection: After sterile prep with Betadine, injected 5 cc 1% lidocaine without epinephrine and 40 mg methylprednisolone into the ACJ without complication.  Good immediate relief.

## 2019-02-28 NOTE — Telephone Encounter (Signed)
This Rx was sent to [facility], just after it was erroneously sent to [facility]. The patient is aware.

## 2019-02-28 NOTE — Progress Notes (Signed)
Office Visit Note   Patient: Willie Walker           Date of Birth: 25-Jun-1965           MRN: [id number] Visit Date: 02/28/2019              Requested by: Hoy Register, MD [address] [place],  [place] [address] PCP: [facility], [facility]   Assessment & Plan: Visit Diagnoses:  1. AC joint arthropathy   2. Chronic right shoulder pain     Plan: MRI of the right shoulder shows severe arthropathy of the [facility] joint with bony edema and subcortical cyst formation.  Rotator cuff is intact.  These findings were reviewed with the patient in detail.  Based on discussion he would like to try another ultrasound-guided [facility] joint injection.  We discussed that if the shots fail to provide him with relief he may need to consider arthroscopic distal clavicle excision.  Questions encouraged and answered.  Follow-up as needed.  Follow-Up Instructions: Return if symptoms worsen or fail to improve.   Orders:  No orders of the defined types were placed in this encounter.  No orders of the defined types were placed in this encounter.     Procedures: No procedures performed   Clinical Data: No additional findings.   Subjective: Chief Complaint  Patient presents with  . Right Shoulder - Pain    Willie Walker returns today for follow-up of right shoulder MRI.  No changes in medical history.   Review of Systems  Constitutional: Negative.   All other systems reviewed and are negative.    Objective: Vital Signs: There were no vitals taken for this visit.  Physical Exam Vitals and nursing note reviewed.  Constitutional:      Appearance: He is well-developed.  Pulmonary:     Effort: Pulmonary effort is normal.  Abdominal:     Palpations: Abdomen is soft.  Skin:    General: Skin is warm.  Neurological:     Mental Status: He is alert and oriented to person, place, and time.  Psychiatric:        Behavior: Behavior normal.        Thought Content: Thought  content normal.        Judgment: Judgment normal.     Ortho Exam Right shoulder exam shows positive cross body adduction.  Painful AC joint.  AC joint is prominent to palpation. Specialty Comments:  No specialty comments available.  Imaging: No results found.   PMFS History: Patient Active Problem List   Diagnosis Date Noted  . Blurry vision, bilateral 10/21/2016  . Memory loss 10/21/2016  . Neuropathic pain of left foot 05/20/2016  . Adjustment insomnia 04/22/2016  . Post-op pain 02/19/2016  . Otitis media 02/19/2016  . 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  . Type 2 diabetes mellitus without complication, without long-term current use of insulin (HCC) 07/25/2015  . Renal cyst 07/25/2015  . Essential hypertension, benign 11/01/2014  . Primary gout 11/01/2014  . Polyneuropathy in diabetes(357.2) 07/28/2012  . Diabetic foot ulcer associated with type 2 diabetes mellitus (HCC) 07/28/2012  . Disc disease, degenerative, lumbar or lumbosacral 07/28/2012  . Heel ulcer, right 06/20/2012  . Type II or unspecified type diabetes mellitus without mention of complication, not stated as uncontrolled 06/20/2012  . Erectile dysfunction associated with type 2 diabetes mellitus (HCC) 03/10/2012  . CAD (coronary artery disease) 01/08/2012  . Pancreatitis 01/08/2012  . Unstable angina (HCC)  09/19/2011  . Type 2 diabetes mellitus (HCC) 09/09/2011  . Mixed hyperlipidemia 09/09/2011  . HTN (hypertension) 09/09/2011  . ELECTROCARDIOGRAM, ABNORMAL 12/12/2008   Past Medical History:  Diagnosis Date  . Allergy   . Anginal pain (HCC)   . Anxiety   . Arthritis   . Childhood asthma   . Chronic lower back pain   . Coronary artery disease   . ELECTROCARDIOGRAM, ABNORMAL   . GERD (gastroesophageal reflux disease)   . Gout   . Headache(784.0) 09/18/2011   "maybe twice/wk; pressure on the brain"  . High cholesterol   . Hypertension   . Kidney stones   . Lower GI bleed 09/18/2011     "clots and everything; not lately"  . Myocardial infarction (HCC) 10/2005  . Peripheral vascular disease (HCC)    LLE  . Pneumonia 1990's  . Seizures (HCC) 09/18/2011   "blanks out on me"/wife's report- pt states never had a seizure- said he would black out from pain yrs ago but never had a seizure -  08-15-2018  . Shortness of breath 09/18/11   "@ rest; lying down; w/exertion"  . Type I diabetes mellitus (HCC)     Family History  Problem Relation Age of Onset  . Hypertension Mother   . Dementia Mother   . CVA Father   . Heart attack Maternal Grandmother   . Colon cancer Neg Hx   . Colon polyps Neg Hx   . Esophageal cancer Neg Hx   . Rectal cancer Neg Hx   . Stomach cancer Neg Hx     Past Surgical History:  Procedure Laterality Date  . BURR HOLE OF CRANIUM  1999   "mugged"  . CARDIAC CATHETERIZATION N/A 08/01/2015   Procedure: Left Heart Cath and Coronary Angiography;  Surgeon: Peter M Swaziland, MD;  Location: [facility] INVASIVE CV LAB;  Service: Cardiovascular;  Laterality: N/A;  . CORONARY ANGIOPLASTY  09/18/2011  . CORONARY ANGIOPLASTY WITH STENT PLACEMENT  10/2005   "9"  . CORONARY ARTERY BYPASS GRAFT N/A 08/05/2015   Procedure: CORONARY ARTERY BYPASS GRAFTING (CABG), ON PUMP, TIMES FIVE, USING LEFT INTERNAL MAMMARY ARTERY AND LEFT RADIAL ARTERY, RIGHT GREATER SAPHENOUS VEIN HARVESTED ENDOSCOPICALLY;  Surgeon: Loreli Slot, MD;  Location: [facility] OR;  Service: Open Heart Surgery;  Laterality: N/A;  . LACERATION REPAIR  1999   BLE "mugging"  . PERCUTANEOUS CORONARY STENT INTERVENTION (PCI-S) N/A 09/18/2011   Procedure: PERCUTANEOUS CORONARY STENT INTERVENTION (PCI-S);  Surgeon: Peter M Swaziland, MD;  Location: [facility] CATH LAB;  Service: Cardiovascular;  Laterality: N/A;  . RADIAL ARTERY HARVEST Left 08/05/2015   Procedure: RADIAL ARTERY HARVEST;  Surgeon: Loreli Slot, MD;  Location: [facility] OR;  Service: Open Heart Surgery;  Laterality: Left;  . TEE WITHOUT CARDIOVERSION N/A 08/05/2015    Procedure: TRANSESOPHAGEAL ECHOCARDIOGRAM (TEE);  Surgeon: Loreli Slot, MD;  Location: [facility] OR;  Service: Open Heart Surgery;  Laterality: N/A;   Social History   Occupational History  . Not on file  Tobacco Use  . Smoking status: Former Smoker    Packs/day: 0.00    Years: 20.00    Pack years: 0.00    Types: Cigarettes    Quit date: 11/02/2005    Years since quitting: 13.3  . Smokeless tobacco: Never Used  Substance and Sexual Activity  . Alcohol use: Yes    Alcohol/week: 4.0 standard drinks    Types: 4 Shots of liquor per week    Comment: 08/01/2015  "coulple shots a couple  times/week "  . Drug use: Yes    Types: Marijuana    Comment: 08/01/2015 "did some coke 07/23/2015; nothing since 2010 before that"  . Sexual activity: Not Currently

## 2019-04-24 ENCOUNTER — Other Ambulatory Visit: Payer: Self-pay | Admitting: Family Medicine

## 2019-04-24 DIAGNOSIS — M1000 Idiopathic gout, unspecified site: Secondary | ICD-10-CM

## 2019-04-24 MED FILL — METOPROLOL TARTRATE 25 MG T: 25 | 30 days supply | Qty: 15 | Fill #5

## 2019-04-24 MED FILL — OMEPRAZOLE 20 MG CAP: 20 | 30 days supply | Qty: 30 | Fill #5

## 2019-04-24 MED FILL — metFORMIN HCL 500 MG TABS: 500 | 30 days supply | Qty: 90 | Fill #3

## 2019-05-19 ENCOUNTER — Encounter: Payer: Self-pay | Admitting: Gastroenterology

## 2019-06-06 DIAGNOSIS — H524 Presbyopia: Secondary | ICD-10-CM | POA: Diagnosis not present

## 2019-06-06 DIAGNOSIS — H16223 Keratoconjunctivitis sicca, not specified as Sjogren's, bilateral: Secondary | ICD-10-CM | POA: Diagnosis not present

## 2019-06-06 DIAGNOSIS — H0288A Meibomian gland dysfunction right eye, upper and lower eyelids: Secondary | ICD-10-CM | POA: Diagnosis not present

## 2019-06-06 DIAGNOSIS — H2513 Age-related nuclear cataract, bilateral: Secondary | ICD-10-CM | POA: Diagnosis not present

## 2019-06-06 DIAGNOSIS — H5213 Myopia, bilateral: Secondary | ICD-10-CM | POA: Diagnosis not present

## 2019-06-06 DIAGNOSIS — H0288B Meibomian gland dysfunction left eye, upper and lower eyelids: Secondary | ICD-10-CM | POA: Diagnosis not present

## 2019-06-06 DIAGNOSIS — H52201 Unspecified astigmatism, right eye: Secondary | ICD-10-CM | POA: Diagnosis not present

## 2019-06-06 DIAGNOSIS — E119 Type 2 diabetes mellitus without complications: Secondary | ICD-10-CM | POA: Diagnosis not present

## 2019-06-13 DIAGNOSIS — E785 Hyperlipidemia, unspecified: Secondary | ICD-10-CM | POA: Diagnosis not present

## 2019-06-13 DIAGNOSIS — M109 Gout, unspecified: Secondary | ICD-10-CM | POA: Diagnosis not present

## 2019-06-13 DIAGNOSIS — K219 Gastro-esophageal reflux disease without esophagitis: Secondary | ICD-10-CM | POA: Diagnosis not present

## 2019-06-13 DIAGNOSIS — M1990 Unspecified osteoarthritis, unspecified site: Secondary | ICD-10-CM | POA: Diagnosis not present

## 2019-06-16 ENCOUNTER — Ambulatory Visit: Payer: Medicaid Other | Admitting: Orthopaedic Surgery

## 2019-06-20 ENCOUNTER — Ambulatory Visit: Payer: Medicaid Other | Attending: Critical Care Medicine

## 2019-06-20 ENCOUNTER — Ambulatory Visit: Payer: Medicaid Other | Admitting: Orthopaedic Surgery

## 2019-06-20 DIAGNOSIS — Z23 Encounter for immunization: Secondary | ICD-10-CM

## 2019-06-20 NOTE — Progress Notes (Signed)
   Covid-19 Vaccination Clinic  Name:  Willie Walker    MRN: [id number] DOB: 1965-05-24  06/20/2019  Mr. [name] was observed post Covid-19 immunization for 15 minutes without incident. He was provided with Vaccine Information Sheet and instruction to access the V-Safe system.   Mr. [name] was instructed to call 911 with any severe reactions post vaccine: Marland Kitchen Difficulty breathing  . Swelling of face and throat  . A fast heartbeat  . A bad rash all over body  . Dizziness and weakness   Immunizations Administered    Name Date Dose VIS Date Route   Moderna COVID-19 Vaccine 06/20/2019 10:54 AM 0.5 mL 12/2018 Intramuscular   Manufacturer: Moderna   Lot: [id number]   NDC: 33435-686-16

## 2019-06-21 ENCOUNTER — Other Ambulatory Visit: Payer: Self-pay | Admitting: Family Medicine

## 2019-06-21 DIAGNOSIS — M1000 Idiopathic gout, unspecified site: Secondary | ICD-10-CM

## 2019-06-21 DIAGNOSIS — E785 Hyperlipidemia, unspecified: Secondary | ICD-10-CM | POA: Diagnosis not present

## 2019-06-21 DIAGNOSIS — N189 Chronic kidney disease, unspecified: Secondary | ICD-10-CM | POA: Diagnosis not present

## 2019-07-04 ENCOUNTER — Encounter: Payer: Medicaid Other | Admitting: Gastroenterology

## 2019-07-13 ENCOUNTER — Encounter (HOSPITAL_COMMUNITY): Payer: Self-pay

## 2019-07-13 ENCOUNTER — Ambulatory Visit (HOSPITAL_COMMUNITY)
Admission: EM | Admit: 2019-07-13 | Discharge: 2019-07-13 | Disposition: A | Discharge status: Home / Self Care | Payer: Medicaid Other | Attending: Family Medicine | Admitting: Family Medicine

## 2019-07-13 ENCOUNTER — Other Ambulatory Visit: Payer: Self-pay

## 2019-07-13 DIAGNOSIS — M109 Gout, unspecified: Secondary | ICD-10-CM | POA: Diagnosis not present

## 2019-07-13 DIAGNOSIS — M1000 Idiopathic gout, unspecified site: Secondary | ICD-10-CM

## 2019-07-13 MED ORDER — COLCHICINE 0.6 MG PO CAPS: ORAL_CAPSULE | ORAL | 0 refills | Status: DC

## 2019-07-13 NOTE — ED Provider Notes (Signed)
[facility]   [id number] 07/13/19 Arrival Time: 1601  ASSESSMENT & PLAN:  1. Acute gout of right elbow, unspecified cause   2. Acute idiopathic gout, unspecified site     Unknown trigger.  Meds ordered this encounter  Medications  . Colchicine (MITIGARE) 0.6 MG CAPS    Sig: Take 2 caps po at onset of flare. May r/p 1 cap in 2 hours if symptoms persist. Must have office visit for refills    Dispense:  30 capsule    Refill:  0    Must have office visit for refills    Recommend:  Follow-up Information    [facility], [facility].   Specialty: Pediatrics Why: As needed. Contact information: [address] [place] [address] [phone]               Reviewed expectations re: course of current medical issues. Questions answered. Outlined signs and symptoms indicating need for more acute intervention. Patient verbalized understanding. After Visit Summary given.  SUBJECTIVE: History from: patient. Willie Walker is a 54 y.o. male who reports a gout flare of his right elbow. Fairly abrupt onset; few days; no injury. Out of colchicine. No new medications or illnesses. No extremity sensation changes or weakness.   Past Surgical History:  Procedure Laterality Date  . BURR HOLE OF CRANIUM  1999   "mugged"  . CARDIAC CATHETERIZATION N/A 08/01/2015   Procedure: Left Heart Cath and Coronary Angiography;  Surgeon: Peter M Swaziland, MD;  Location: [facility] INVASIVE CV LAB;  Service: Cardiovascular;  Laterality: N/A;  . CORONARY ANGIOPLASTY  09/18/2011  . CORONARY ANGIOPLASTY WITH STENT PLACEMENT  10/2005   "9"  . CORONARY ARTERY BYPASS GRAFT N/A 08/05/2015   Procedure: CORONARY ARTERY BYPASS GRAFTING (CABG), ON PUMP, TIMES FIVE, USING LEFT INTERNAL MAMMARY ARTERY AND LEFT RADIAL ARTERY, RIGHT GREATER SAPHENOUS VEIN HARVESTED ENDOSCOPICALLY;  Surgeon: Loreli Slot, MD;  Location: [facility] OR;  Service: Open Heart Surgery;  Laterality: N/A;  .  LACERATION REPAIR  1999   BLE "mugging"  . PERCUTANEOUS CORONARY STENT INTERVENTION (PCI-S) N/A 09/18/2011   Procedure: PERCUTANEOUS CORONARY STENT INTERVENTION (PCI-S);  Surgeon: Peter M Swaziland, MD;  Location: [facility] CATH LAB;  Service: Cardiovascular;  Laterality: N/A;  . RADIAL ARTERY HARVEST Left 08/05/2015   Procedure: RADIAL ARTERY HARVEST;  Surgeon: Loreli Slot, MD;  Location: [facility] OR;  Service: Open Heart Surgery;  Laterality: Left;  . TEE WITHOUT CARDIOVERSION N/A 08/05/2015   Procedure: TRANSESOPHAGEAL ECHOCARDIOGRAM (TEE);  Surgeon: Loreli Slot, MD;  Location: [facility] OR;  Service: Open Heart Surgery;  Laterality: N/A;      OBJECTIVE:  Vitals:   07/13/19 1636  BP: 107/73  Pulse: (!) 113  Resp: 16  Temp: 98.4 F (36.9 C)  SpO2: 96%    General appearance: alert; no distress HEENT: Willie Walker; AT Neck: supple with FROM Resp: unlabored respirations Extremities: . LUE: warm with well perfused appearance; poorly localized moderate tenderness over right elbow; without gross deformities; swelling: none; bruising: none; elbow ROM: normal, with discomfort CV: brisk extremity capillary refill of RUE; 2+ radial pulse of RUE. Skin: warm and dry; no visible rashes Neurologic: gait normal; normal sensation and strength of RUE Psychological: alert and cooperative; normal mood and affect     Allergies  Allergen Reactions  . Crestor [Rosuvastatin] Other (See Comments)    "Makes my legs hurt" - tolerates simvastatin     Past Medical History:  Diagnosis Date  . Allergy   .  Anginal pain ([place])   . Anxiety   . Arthritis   . Childhood asthma   . Chronic lower back pain   . Coronary artery disease   . ELECTROCARDIOGRAM, ABNORMAL   . GERD (gastroesophageal reflux disease)   . Gout   . Headache(784.0) 09/18/2011   "maybe twice/wk; pressure on the brain"  . High cholesterol   . Hypertension   . Kidney stones   . Lower GI bleed 09/18/2011   "clots and everything; not lately"  .  Myocardial infarction ([place]) 10/2005  . Peripheral vascular disease (HCC)    LLE  . Pneumonia 1990's  . Seizures ([place]) 09/18/2011   "blanks out on me"/wife's report- pt states never had a seizure- said he would black out from pain yrs ago but never had a seizure -  08-15-2018  . Shortness of breath 09/18/11   "@ rest; lying down; w/exertion"  . Type I diabetes mellitus ([place])    Social History   Socioeconomic History  . Marital status: Divorced    Spouse name: Not on file  . Number of children: Not on file  . Years of education: Not on file  . Highest education level: Not on file  Occupational History  . Not on file  Tobacco Use  . Smoking status: Former Smoker    Packs/day: 0.00    Years: 20.00    Pack years: 0.00    Types: Cigarettes    Quit date: 11/02/2005    Years since quitting: 13.7  . Smokeless tobacco: Never Used  Substance and Sexual Activity  . Alcohol use: Yes    Alcohol/week: 4.0 standard drinks    Types: 4 Shots of liquor per week    Comment: 08/01/2015  "coulple shots a couple times/week "  . Drug use: Yes    Types: Marijuana    Comment: 08/01/2015 "did some coke 07/23/2015; nothing since 2010 before that"  . Sexual activity: Not Currently  Other Topics Concern  . Not on file  Social History Narrative  . Not on file   Social Determinants of Health   Financial Resource Strain:   . Difficulty of Paying Living Expenses:   Food Insecurity:   . Worried About Charity fundraiser in the Last Year:   . Arboriculturist in the Last Year:   Transportation Needs:   . Film/video editor (Medical):   Marland Kitchen Lack of Transportation (Non-Medical):   Physical Activity:   . Days of Exercise per Week:   . Minutes of Exercise per Session:   Stress:   . Feeling of Stress :   Social Connections:   . Frequency of Communication with Friends and Family:   . Frequency of Social Gatherings with Friends and Family:   . Attends Religious Services:   . Active Member of Clubs or  Organizations:   . Attends Archivist Meetings:   Marland Kitchen Marital Status:    Family History  Problem Relation Age of Onset  . Hypertension Mother   . Dementia Mother   . CVA Father   . Heart attack Maternal Grandmother   . Colon cancer Neg Hx   . Colon polyps Neg Hx   . Esophageal cancer Neg Hx   . Rectal cancer Neg Hx   . Stomach cancer Neg Hx    Past Surgical History:  Procedure Laterality Date  . [place]   "mugged"  . CARDIAC CATHETERIZATION N/A 08/01/2015   Procedure: Left Heart Cath  and Coronary Angiography;  Surgeon: Peter M Swaziland, MD;  Location: [facility] INVASIVE CV LAB;  Service: Cardiovascular;  Laterality: N/A;  . CORONARY ANGIOPLASTY  09/18/2011  . CORONARY ANGIOPLASTY WITH STENT PLACEMENT  10/2005   "9"  . CORONARY ARTERY BYPASS GRAFT N/A 08/05/2015   Procedure: CORONARY ARTERY BYPASS GRAFTING (CABG), ON PUMP, TIMES FIVE, USING LEFT INTERNAL MAMMARY ARTERY AND LEFT RADIAL ARTERY, RIGHT GREATER SAPHENOUS VEIN HARVESTED ENDOSCOPICALLY;  Surgeon: Loreli Slot, MD;  Location: [facility] OR;  Service: Open Heart Surgery;  Laterality: N/A;  . LACERATION REPAIR  1999   BLE "mugging"  . PERCUTANEOUS CORONARY STENT INTERVENTION (PCI-S) N/A 09/18/2011   Procedure: PERCUTANEOUS CORONARY STENT INTERVENTION (PCI-S);  Surgeon: Peter M Swaziland, MD;  Location: [facility] CATH LAB;  Service: Cardiovascular;  Laterality: N/A;  . RADIAL ARTERY HARVEST Left 08/05/2015   Procedure: RADIAL ARTERY HARVEST;  Surgeon: Loreli Slot, MD;  Location: [facility] OR;  Service: Open Heart Surgery;  Laterality: Left;  . TEE WITHOUT CARDIOVERSION N/A 08/05/2015   Procedure: TRANSESOPHAGEAL ECHOCARDIOGRAM (TEE);  Surgeon: Loreli Slot, MD;  Location: [facility] OR;  Service: Open Heart Surgery;  Laterality: N/A;      Mardella Layman, MD 07/13/19 1744

## 2019-07-13 NOTE — ED Triage Notes (Signed)
Patient reports a gout flare up. Reports it has been in his elbow for a few days and woke up this morning with another flare in his foot.

## 2019-07-14 MED FILL — MITIGARE 0.6 MG CAPSULE: 0.6 | 10 days supply | Qty: 30 | Fill #0

## 2019-07-18 ENCOUNTER — Ambulatory Visit: Payer: Medicaid Other | Attending: Critical Care Medicine

## 2019-07-18 DIAGNOSIS — Z23 Encounter for immunization: Secondary | ICD-10-CM

## 2019-07-18 NOTE — Progress Notes (Signed)
   Covid-19 Vaccination Clinic  Name:  Willie Walker    MRN: [id number] DOB: December 09, 1965  07/18/2019  [name] was observed post Covid-19 immunization for 15 minutes without incident. He was provided with Vaccine Information Sheet and instruction to access the V-Safe system.   [name] was instructed to call 911 with any severe reactions post vaccine: Marland Kitchen Difficulty breathing  . Swelling of face and throat  . A fast heartbeat  . A bad rash all over body  . Dizziness and weakness   Immunizations Administered    Name Date Dose VIS Date Route   Moderna COVID-19 Vaccine 07/18/2019 10:53 AM 0.5 mL 12/2018 Intramuscular   Manufacturer: Moderna   Lot: [id number]   NDC: 62863-817-71

## 2019-07-20 DIAGNOSIS — Z419 Encounter for procedure for purposes other than remedying health state, unspecified: Secondary | ICD-10-CM | POA: Diagnosis not present

## 2019-07-27 ENCOUNTER — Other Ambulatory Visit: Payer: Self-pay

## 2019-07-27 ENCOUNTER — Ambulatory Visit (AMBULATORY_SURGERY_CENTER): Payer: Self-pay

## 2019-07-27 VITALS — Ht 71.0 in | Wt 163.0 lb

## 2019-07-27 DIAGNOSIS — Z1211 Encounter for screening for malignant neoplasm of colon: Secondary | ICD-10-CM

## 2019-07-27 MED ORDER — NA SULFATE-K SULFATE-MG SULF 17.5-3.13-1.6 GM/177ML PO SOLN: 1.0000 | Freq: Once | ORAL | 0 refills | Status: AC

## 2019-07-27 NOTE — Progress Notes (Signed)
No egg or soy allergy known to patient  No issues with past sedation with any surgeries  or procedures, no intubation problems  No diet pills per patient No home 02 use per patient  No blood thinners per patient  Pt denies issues with constipation  No A fib or A flutter  EMMI video sent to pt's e mail  COVID 19 guidelines implemented in PV today   COVID vaccines completed on 07/18/2019  Pre visit completed via phone due to patient's failure to arrive in person for pre visit-pt confirmed address for mailing of pre instructions per patient request  Due to the COVID-19 pandemic we are asking patients to follow these guidelines. Please only bring one care partner. Please be aware that your care partner may wait in the car in the parking lot or if they feel like they will be too hot to wait in the car, they may wait in the lobby on the 4th floor. All care partners are required to wear a mask the entire time (we do not have any that we can provide them), they need to practice social distancing, and we will do a Covid check for all patient's and care partners when you arrive. Also we will check their temperature and your temperature. If the care partner waits in their car they need to stay in the parking lot the entire time and we will call them on their cell phone when the patient is ready for discharge so they can bring the car to the front of the building. Also all patient's will need to wear a mask into building.

## 2019-08-10 ENCOUNTER — Encounter: Payer: Medicaid Other | Admitting: Gastroenterology

## 2019-08-15 ENCOUNTER — Ambulatory Visit: Payer: Medicaid Other | Admitting: Orthopaedic Surgery

## 2019-08-17 ENCOUNTER — Encounter: Payer: Self-pay | Admitting: Podiatry

## 2019-08-17 ENCOUNTER — Other Ambulatory Visit: Payer: Self-pay

## 2019-08-17 ENCOUNTER — Ambulatory Visit (INDEPENDENT_AMBULATORY_CARE_PROVIDER_SITE_OTHER): Payer: Medicaid Other | Admitting: Podiatry

## 2019-08-17 ENCOUNTER — Ambulatory Visit: Payer: Medicaid Other

## 2019-08-17 ENCOUNTER — Ambulatory Visit (INDEPENDENT_AMBULATORY_CARE_PROVIDER_SITE_OTHER): Payer: Medicaid Other

## 2019-08-17 DIAGNOSIS — M1000 Idiopathic gout, unspecified site: Secondary | ICD-10-CM

## 2019-08-17 DIAGNOSIS — M779 Enthesopathy, unspecified: Secondary | ICD-10-CM

## 2019-08-17 MED ORDER — PREDNISONE 10 MG PO TABS: ORAL_TABLET | ORAL | 0 refills | Status: DC

## 2019-08-17 MED FILL — predniSONE 10 MG TABS: 10 | 12 days supply | Qty: 48 | Fill #0

## 2019-08-17 NOTE — Progress Notes (Signed)
Subjective:   Patient ID: Willie Walker, male   DOB: 54 y.o.   MRN: [id number]   HPI Patient presents stating he has had an acute attack on both feet gout and has very much concerned about this and was on allopurinol every day and is not able to get colchicine as its not covered by insurance anymore and he does not afford it   ROS      Objective:  Physical Exam  Neurovascular status was found to be intact with acute discomfort in the sinus tarsi right first MPJ right and the lateral foot left with inflammation fluid buildup noted.  There is multiple signs of gout of a significant nature bilateral      Assessment:  Appears to be acute gout attack which started the left and is now migrated to the right foot     Plan:  H&P educated him on gout and reviewed with him medications we will start him on a Sterapred DS Dosepak.  Due to the intense discomfort I did inject the sinus tarsi right 3 mg Kenalog 5 g liken in the lateral tenderness complex left 3 mg Kenalog 5 mg Xylocaine advised on reduced activity and continued allopurinol.  If symptoms do not get better he will let us know

## 2019-08-22 ENCOUNTER — Encounter: Payer: Self-pay | Admitting: Orthopaedic Surgery

## 2019-08-22 ENCOUNTER — Ambulatory Visit (INDEPENDENT_AMBULATORY_CARE_PROVIDER_SITE_OTHER): Payer: Medicaid Other | Admitting: Orthopaedic Surgery

## 2019-08-22 VITALS — Ht 71.5 in | Wt 157.0 lb

## 2019-08-22 DIAGNOSIS — M25511 Pain in right shoulder: Secondary | ICD-10-CM | POA: Diagnosis not present

## 2019-08-22 DIAGNOSIS — G8929 Other chronic pain: Secondary | ICD-10-CM

## 2019-08-22 DIAGNOSIS — M19019 Primary osteoarthritis, unspecified shoulder: Secondary | ICD-10-CM | POA: Diagnosis not present

## 2019-08-22 NOTE — Progress Notes (Signed)
Office Visit Note   Patient: Willie Walker           Date of Birth: 1965-11-24           MRN: [id number] Visit Date: 08/22/2019              Requested by: [facility] [address] [place],  [place] [address] PCP: [facility]   Assessment & Plan: Visit Diagnoses:  1. AC joint arthropathy   2. Chronic right shoulder pain     Plan: Based on our discussion and the lack of relief from conservative treatments the patient has elected to move forward with scheduling for right shoulder arthroscopy with distal clavicle excision.  Risk benefits rehab recovery reviewed in detail with the patient.  Questions encouraged and answered.  He would like to have this scheduled in October.  Follow-Up Instructions: Return if symptoms worsen or fail to improve.   Orders:  No orders of the defined types were placed in this encounter.  No orders of the defined types were placed in this encounter.     Procedures: No procedures performed   Clinical Data: No additional findings.   Subjective: Chief Complaint  Patient presents with  . Right Shoulder - Pain    Willie Walker returns today for follow-up of his chronic right shoulder pain due to his severe AC joint arthropathy.  Dr. Prince Rome performed ultrasound-guided [facility] joint in February which he states helped for about 2 weeks.  Denies any changes otherwise.   Review of Systems  Constitutional: Negative.   All other systems reviewed and are negative.    Objective: Vital Signs: Ht 5' 11.5" (1.816 m)   Wt 157 lb (71.2 kg)   BMI 21.59 kg/m   Physical Exam Vitals and nursing note reviewed.  Constitutional:      Appearance: He is well-developed.  Pulmonary:     Effort: Pulmonary effort is normal.  Abdominal:     Palpations: Abdomen is soft.  Skin:    General: Skin is warm.  Neurological:     Mental Status: He is alert and oriented to person, place, and time.  Psychiatric:         Behavior: Behavior normal.        Thought Content: Thought content normal.        Judgment: Judgment normal.     Ortho Exam Right shoulder exam is unchanged.  Positive cross body adduction.  Pain with palpation over the [facility] joint. Specialty Comments:  No specialty comments available.  Imaging: No results found.   PMFS History: Patient Active Problem List   Diagnosis Date Noted  . Blurry vision, bilateral 10/21/2016  . Memory loss 10/21/2016  . Neuropathic pain of left foot 05/20/2016  . Adjustment insomnia 04/22/2016  . Post-op pain 02/19/2016  . Otitis media 02/19/2016  . 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  . Type 2 diabetes mellitus without complication, without long-term current use of insulin (HCC) 07/25/2015  . Renal cyst 07/25/2015  . Essential hypertension, benign 11/01/2014  . Primary gout 11/01/2014  . Polyneuropathy in diabetes(357.2) 07/28/2012  . Diabetic foot ulcer associated with type 2 diabetes mellitus (HCC) 07/28/2012  . Disc disease, degenerative, lumbar or lumbosacral 07/28/2012  . Heel ulcer, right 06/20/2012  . Type II or unspecified type diabetes mellitus without mention of complication, not stated as uncontrolled 06/20/2012  . Erectile dysfunction associated with type 2 diabetes mellitus (HCC) 03/10/2012  . CAD (  coronary artery disease) 01/08/2012  . Pancreatitis 01/08/2012  . Unstable angina (HCC) 09/19/2011  . Type 2 diabetes mellitus (HCC) 09/09/2011  . Mixed hyperlipidemia 09/09/2011  . HTN (hypertension) 09/09/2011  . ELECTROCARDIOGRAM, ABNORMAL 12/12/2008   Past Medical History:  Diagnosis Date  . Allergy   . Anginal pain (HCC)   . Anxiety   . Arthritis   . Childhood asthma   . Chronic lower back pain   . Coronary artery disease   . ELECTROCARDIOGRAM, ABNORMAL   . GERD (gastroesophageal reflux disease)   . Gout   . Headache(784.0) 09/18/2011   "maybe twice/wk; pressure on the brain"  . High cholesterol   .  Hypertension   . Kidney stones   . Lower GI bleed 09/18/2011   "clots and everything; not lately"  . Myocardial infarction (HCC) 10/2005  . Peripheral vascular disease (HCC)    LLE  . Pneumonia 1990's  . Seizures (HCC) 09/18/2011   "blanks out on me"/wife's report- pt states never had a seizure- said he would black out from pain yrs ago but never had a seizure -  08-15-2018  . Shortness of breath 09/18/11   "@ rest; lying down; w/exertion"  . Type I diabetes mellitus (HCC)     Family History  Problem Relation Age of Onset  . Hypertension Mother   . Dementia Mother   . CVA Father   . Heart attack Maternal Grandmother   . Colon cancer Neg Hx   . Colon polyps Neg Hx   . Esophageal cancer Neg Hx   . Rectal cancer Neg Hx   . Stomach cancer Neg Hx     Past Surgical History:  Procedure Laterality Date  . BURR HOLE OF CRANIUM  1999   "mugged"  . CARDIAC CATHETERIZATION N/A 08/01/2015   Procedure: Left Heart Cath and Coronary Angiography;  Surgeon: Peter M Swaziland, MD;  Location: [facility] INVASIVE CV LAB;  Service: Cardiovascular;  Laterality: N/A;  . CORONARY ANGIOPLASTY  09/18/2011  . CORONARY ANGIOPLASTY WITH STENT PLACEMENT  10/2005   "9"  . CORONARY ARTERY BYPASS GRAFT N/A 08/05/2015   Procedure: CORONARY ARTERY BYPASS GRAFTING (CABG), ON PUMP, TIMES FIVE, USING LEFT INTERNAL MAMMARY ARTERY AND LEFT RADIAL ARTERY, RIGHT GREATER SAPHENOUS VEIN HARVESTED ENDOSCOPICALLY;  Surgeon: Loreli Slot, MD;  Location: [facility] OR;  Service: Open Heart Surgery;  Laterality: N/A;  . LACERATION REPAIR  1999   BLE "mugging"  . PERCUTANEOUS CORONARY STENT INTERVENTION (PCI-S) N/A 09/18/2011   Procedure: PERCUTANEOUS CORONARY STENT INTERVENTION (PCI-S);  Surgeon: Peter M Swaziland, MD;  Location: [facility] CATH LAB;  Service: Cardiovascular;  Laterality: N/A;  . RADIAL ARTERY HARVEST Left 08/05/2015   Procedure: RADIAL ARTERY HARVEST;  Surgeon: Loreli Slot, MD;  Location: [facility] OR;  Service: Open Heart Surgery;   Laterality: Left;  . TEE WITHOUT CARDIOVERSION N/A 08/05/2015   Procedure: TRANSESOPHAGEAL ECHOCARDIOGRAM (TEE);  Surgeon: Loreli Slot, MD;  Location: [facility] OR;  Service: Open Heart Surgery;  Laterality: N/A;   Social History   Occupational History  . Not on file  Tobacco Use  . Smoking status: Former Smoker    Packs/day: 0.00    Years: 20.00    Pack years: 0.00    Types: Cigarettes    Quit date: 11/02/2005    Years since quitting: 13.8  . Smokeless tobacco: Never Used  Substance and Sexual Activity  . Alcohol use: Yes    Alcohol/week: 4.0 standard drinks    Types: 4 Shots of liquor  per week    Comment: 08/01/2015  "coulple shots a couple times/week "  . Drug use: Yes    Types: Marijuana    Comment: 08/01/2015 "did some coke 07/23/2015; nothing since 2010 before that"  . Sexual activity: Not Currently

## 2019-10-04 ENCOUNTER — Encounter: Payer: Self-pay | Admitting: Podiatry

## 2019-10-04 ENCOUNTER — Ambulatory Visit: Payer: Medicaid Other | Admitting: Podiatry

## 2019-10-04 ENCOUNTER — Encounter: Payer: Medicaid Other | Admitting: Gastroenterology

## 2019-10-04 ENCOUNTER — Other Ambulatory Visit: Payer: Self-pay

## 2019-10-04 DIAGNOSIS — M779 Enthesopathy, unspecified: Secondary | ICD-10-CM

## 2019-10-04 DIAGNOSIS — M7751 Other enthesopathy of right foot: Secondary | ICD-10-CM

## 2019-10-04 DIAGNOSIS — E119 Type 2 diabetes mellitus without complications: Secondary | ICD-10-CM

## 2019-10-04 NOTE — Progress Notes (Signed)
Subjective:   Patient ID: Willie Walker, male   DOB: 54 y.o.   MRN: [id number]   HPI Patient presents stating that he has had continued for gout attack despite injections and medicine and is not able to get the medicine he wants because his insurance does not cover   ROS      Objective:  Physical Exam  Neurovascular status intact with patient found to have inflammation of both feet with discomfort which is moving around and also is affecting his legs.  He did very well when he was on the colchicine but has not been able to afford taking it and has not seen his family physician     Assessment:  Appears to be an inflammatory condition which did not respond to the injections we did previously with patient found to have allopurinol that has only been mildly effective for him     Plan:  H&P reviewed condition with patient.  We discussed different treatment options and I do think one where another he needs to get on the colchicine and he will return to his family physician for check and consideration of colchicine.  Patient at this point we will try soaks will try stretching and support shoes to see if his symptoms can stay reasonably under control

## 2019-10-05 ENCOUNTER — Encounter (HOSPITAL_BASED_OUTPATIENT_CLINIC_OR_DEPARTMENT_OTHER): Payer: Self-pay | Admitting: Orthopaedic Surgery

## 2019-10-05 ENCOUNTER — Other Ambulatory Visit: Payer: Self-pay

## 2019-10-05 NOTE — Progress Notes (Signed)
chart reviewed by Dr.Miller, patient will need to see cardiology prior to surgery and clearance. Will also need to hold ASA per Dr.Nishan recommendations. Once these are obtain, will proceed with surgery as scheduled at [facility]. Message to [facility] at Dr. Roda Shutters office about above. Patient is also aware that he will need to see cardiology prior to surgery.

## 2019-10-06 ENCOUNTER — Telehealth: Payer: Self-pay

## 2019-10-06 ENCOUNTER — Telehealth: Payer: Self-pay | Admitting: *Deleted

## 2019-10-06 NOTE — Telephone Encounter (Signed)
2 months roughly

## 2019-10-06 NOTE — Telephone Encounter (Signed)
   [facility] Pre-operative Risk Assessment    HEARTCARE STAFF: - Please ensure there is not already an duplicate clearance open for this procedure. - Under Visit Info/Reason for Call, type in Other and utilize the format Clearance MM/DD/YY or Clearance TBD. Do not use dashes or single digits. - If request is for dental extraction, please clarify the # of teeth to be extracted.  Request for surgical clearance:  1. What type of surgery is being performed? Right shoulder arthoscopy   2. When is this surgery scheduled? 10/11/2019   3. What type of clearance is required (medical clearance vs. Pharmacy clearance to hold med vs. Both)? Both  4. Are there any medications that need to be held prior to surgery and how long?Asa 325 mg qd   5. Practice name and name of physician performing surgery? [facility] Dr Eduard Roux   6. What is the office phone number? [phone]   7.   What is the office fax number? [place]  8.   Anesthesia type (None, local, MAC, general) ? General+Block   [place], Veryl Winemiller L 10/06/2019, 5:43 PM  _________________________________________________________________   (provider comments below)

## 2019-10-06 NOTE — Telephone Encounter (Signed)
Patient needs to know recovery time from surgery.

## 2019-10-07 ENCOUNTER — Inpatient Hospital Stay (HOSPITAL_COMMUNITY): Admission: RE | Admit: 2019-10-07 | Payer: Medicaid Other | Source: Ambulatory Visit

## 2019-10-09 NOTE — Telephone Encounter (Signed)
Patient aware. He would like to have SU in Nov. Call transferred to [facility].

## 2019-10-09 NOTE — Telephone Encounter (Signed)
Pt was contacted regarding surgical clearance and the need of clearance.  Pt advised me that he has cancelled his surgery but went ahead and scheduled his yearly follow-up with Dr. Eden Emms.   Will route back to the requesting surgeon's office to make them aware pt cancelled surgery.

## 2019-10-09 NOTE — Telephone Encounter (Signed)
° °  Primary Cardiologist:Peter Eden Emms, MD  Chart reviewed as part of pre-operative protocol coverage. Because of Willie Walker's past medical history and time since last visit, they will require a follow-up visit in order to better assess preoperative cardiovascular risk.  Last OV was >1 year ago, complex cardiac hx noted.  Pre-op covering staff: - Please schedule appointment and call patient to inform them. - Please contact requesting surgeon's office via preferred method (i.e, phone, fax) to inform them of need for appointment prior to surgery.  Determination of holding ASA can be decided at time of OV, as it also raises question of long term dosing plans a well.  Laurann Montana, PA-C  10/09/2019, 8:37 AM

## 2019-10-10 ENCOUNTER — Other Ambulatory Visit: Payer: Self-pay

## 2019-10-11 ENCOUNTER — Ambulatory Visit (HOSPITAL_BASED_OUTPATIENT_CLINIC_OR_DEPARTMENT_OTHER): Admission: RE | Admit: 2019-10-11 | Payer: Medicaid Other | Source: Home / Self Care | Admitting: Orthopaedic Surgery

## 2019-10-11 SURGERY — SHOULDER ARTHROSCOPY WITH SUBACROMIAL DECOMPRESSION AND DISTAL CLAVICLE EXCISION
Anesthesia: General | Site: Shoulder | Laterality: Right

## 2019-10-19 ENCOUNTER — Inpatient Hospital Stay: Payer: Medicaid Other | Admitting: Orthopaedic Surgery

## 2019-11-21 ENCOUNTER — Ambulatory Visit: Payer: Medicaid Other | Admitting: Orthopaedic Surgery

## 2019-11-28 ENCOUNTER — Ambulatory Visit: Payer: Medicaid Other

## 2019-12-19 ENCOUNTER — Telehealth: Payer: Self-pay

## 2019-12-19 DIAGNOSIS — Z951 Presence of aortocoronary bypass graft: Secondary | ICD-10-CM

## 2019-12-19 NOTE — Progress Notes (Signed)
Cardiology Office Note    Date:  12/21/2019   ID:  Willie Walker, DOB 1965-02-03, MRN [id number]  PCP:  [facility]  Cardiologist: Dr. Johnsie Cancel  CC: CAD/CABG   History of Present Illness:  Willie Walker is a 54 y.o. male with a history of CAD s/p multiple PCIs and CABG x5V (07/2015), HTN, HLD, PVD and  polysubstance abuse Last EF in 2017 45-50% PCI"s involved full metal jacket to RCA and cutting balloon mid circumflex ans well as stents in IM, LAD    Progressive angina in 2017 Cath by Dr. Martinique.  revealed critical 3 vessel obstructive CAD and he was referred for CABG. x5V utilizing LIMA to LAD, SVG to Diagonal, Sequential SVG to Ramus Intermediate and OM, and Radial artery to the PDA on 08/05/15 with Dr. Roxan Hockey. He also underwent open harvest of his left radial artery and endoscopic harvest of greater saphenous vein from right thigh. His hospital course was complicated by pericarditis  And elevated Cr with ACE  No angina Mother living with him at home [employer] for the [employer] in basketball  Has chronic right shoulder pain Needs arthroscopy with DR Erlinda Hong.  Ol to proceed  Should not have to hold ASA for this   No angina    Past Medical History:  Diagnosis Date  . Allergy   . Anginal pain ([place])   . Anxiety   . Arthritis   . Childhood asthma   . Chronic lower back pain   . Coronary artery disease   . ELECTROCARDIOGRAM, ABNORMAL   . GERD (gastroesophageal reflux disease)   . Gout   . Headache(784.0) 09/18/2011   "maybe twice/wk; pressure on the brain"  . High cholesterol   . Hypertension   . Kidney stones   . Lower GI bleed 09/18/2011   "clots and everything; not lately"  . Myocardial infarction ([place]) 10/2005  . Peripheral vascular disease (HCC)    LLE  . Pneumonia 1990's  . Seizures ([place]) 09/18/2011   "blanks out on me"/wife's report- pt states never had a seizure- said he would black out from pain yrs ago but never had a seizure -  08-15-2018    . Shortness of breath 09/18/11   "@ rest; lying down; w/exertion"  . Type I diabetes mellitus ([place])     Past Surgical History:  Procedure Laterality Date  . [place]   "mugged"  . CARDIAC CATHETERIZATION N/A 08/01/2015   Procedure: Left Heart Cath and Coronary Angiography;  Surgeon: Kaytelyn Glore M [place]ique, MD;  Location: [place] CV LAB;  Service: Cardiovascular;  Laterality: N/A;  . CORONARY ANGIOPLASTY  09/18/2011  . CORONARY ANGIOPLASTY WITH STENT PLACEMENT  10/2005   "9"  . CORONARY ARTERY BYPASS GRAFT N/A 08/05/2015   Procedure: CORONARY ARTERY BYPASS GRAFTING (CABG), ON PUMP, TIMES FIVE, USING LEFT INTERNAL MAMMARY ARTERY AND LEFT RADIAL ARTERY, RIGHT GREATER SAPHENOUS VEIN HARVESTED ENDOSCOPICALLY;  Surgeon: Melrose Nakayama, MD;  Location: [place];  Service: Open Heart Surgery;  Laterality: N/A;  . [place] "mugging"  . PERCUTANEOUS CORONARY STENT INTERVENTION (PCI-S) N/A 09/18/2011   Procedure: PERCUTANEOUS CORONARY STENT INTERVENTION (PCI-S);  Surgeon: Linzey Ramser M [place]ique, MD;  Location: [facility] CATH LAB;  Service: Cardiovascular;  Laterality: N/A;  . RADIAL ARTERY HARVEST Left 08/05/2015   Procedure: RADIAL ARTERY HARVEST;  Surgeon: Melrose Nakayama, MD;  Location: [place];  Service: Open Heart Surgery;  Laterality: Left;  .  TEE WITHOUT CARDIOVERSION N/A 08/05/2015   Procedure: TRANSESOPHAGEAL ECHOCARDIOGRAM (TEE);  Surgeon: Melrose Nakayama, MD;  Location: [place];  Service: Open Heart Surgery;  Laterality: N/A;    Current Medications: Outpatient Medications Prior to Visit  Medication Sig Dispense Refill  . Accu-Chek FastClix Lancets MISC Use as directed to test blood sugar once daily 102 each 12  . allopurinol (ZYLOPRIM) 300 MG tablet Take 1 tablet (300 mg total) by mouth daily. 30 tablet 6  . aspirin 325 MG EC tablet Take 1 tablet (325 mg total) by mouth daily. 60 tablet 3  . Blood Glucose Monitoring Suppl (ACCU-CHEK GUIDE) w/Device KIT 1 each by  Does not apply route daily. 1 kit 0  . fish oil-omega-3 fatty acids 1000 MG capsule Take 1 g by mouth 2 (two) times daily.    Marland Kitchen glucose blood (ACCU-CHEK GUIDE) test strip Use as instructed to test blood sugar once daily 100 each 12  . metFORMIN (GLUCOPHAGE) 500 MG tablet 2 tabs in the morning and one in the evening with food 270 tablet 1  . metoprolol tartrate (LOPRESSOR) 25 MG tablet Take 0.5 tablets (12.5 mg total) by mouth daily. 90 tablet 1  . omeprazole (PRILOSEC) 20 MG capsule Take 1 capsule (20 mg total) by mouth daily. Needs appt for more refills. 30 capsule 6  . simvastatin (ZOCOR) 20 MG tablet Take 1 tablet (20 mg total) by mouth at bedtime. 90 tablet 1  . SUPREP BOWEL PREP KIT 17.5-3.13-1.6 GM/177ML SOLN Take by mouth once.     No facility-administered medications prior to visit.     Allergies:   Crestor [rosuvastatin]   Social History   Socioeconomic History  . Marital status: Divorced    Spouse name: Not on file  . Number of children: Not on file  . Years of education: Not on file  . Highest education level: Not on file  Occupational History  . Not on file  Tobacco Use  . Smoking status: Former Smoker    Packs/day: 0.00    Years: 20.00    Pack years: 0.00    Types: Cigarettes    Quit date: 11/02/2005    Years since quitting: 14.1  . Smokeless tobacco: Never Used  . Tobacco comment: advised no smoking marijuana 24 hours prior to surgey   Substance and Sexual Activity  . Alcohol use: Yes    Alcohol/week: 4.0 standard drinks    Types: 4 Shots of liquor per week    Comment: 08/01/2015  "coulple shots a couple times/week "  . Drug use: Yes    Types: Marijuana    Comment: 08/01/2015 "did some coke 07/23/2015; nothing since 2010 before that"  . Sexual activity: Not Currently  Other Topics Concern  . Not on file  Social History Narrative  . Not on file   Social Determinants of Health   Financial Resource Strain:   . Difficulty of Paying Living Expenses: Not on file   Food Insecurity:   . Worried About Charity fundraiser in the Last Year: Not on file  . Ran Out of Food in the Last Year: Not on file  Transportation Needs:   . Lack of Transportation (Medical): Not on file  . Lack of Transportation (Non-Medical): Not on file  Physical Activity:   . Days of Exercise per Week: Not on file  . Minutes of Exercise per Session: Not on file  Stress:   . Feeling of Stress : Not on file  Social Connections:   .  Frequency of Communication with Friends and Family: Not on file  . Frequency of Social Gatherings with Friends and Family: Not on file  . Attends Religious Services: Not on file  . Active Member of Clubs or Organizations: Not on file  . Attends Archivist Meetings: Not on file  . Marital Status: Not on file     Family History:  The patient's family history includes CVA in his father; Dementia in his mother; Heart attack in his maternal grandmother; Hypertension in his mother.     ROS:   Please see the history of present illness.    ROS All other systems reviewed and are negative.   PHYSICAL EXAM:   VS:  BP 110/88   Pulse 64   Ht 5' 11" (1.803 m)   Wt 79.4 kg   SpO2 99%   BMI 24.41 kg/m    Affect appropriate Healthy:  appears stated age 81: normal Neck supple with no adenopathy JVP normal no bruits no thyromegaly Lungs clear with no wheezing and good diaphragmatic motion Heart:  S1/S2 no murmur, no rub, gallop or click PMI normal post sternotomy  Abdomen: benighn, BS positve, no tenderness, no AAA no bruit.  No HSM or HJR Left radial harvest  No edema Neuro non-focal Skin warm and dry No muscular weakness Restricted ROM right shoulder    Wt Readings from Last 3 Encounters:  12/21/19 79.4 kg  08/22/19 71.2 kg  07/27/19 73.9 kg      Studies/Labs Reviewed:   EKG:  08/30/15  NSR HR 70 with non specific ST/TW abnormalities. 12/21/2019 NSR rate 64 normal   Recent Labs: 12/20/2019: ALT 32   Lipid Panel      Component Value Date/Time   CHOL 264 (H) 12/20/2019 1435   TRIG 186 (H) 12/20/2019 1435   HDL 51 12/20/2019 1435   CHOLHDL 5.2 (H) 12/20/2019 1435   CHOLHDL 4.4 07/31/2015 1051   VLDL 20 07/31/2015 1051   LDLCALC 178 (H) 12/20/2019 1435    Additional studies/ records that were reviewed today include:  LHC 08/01/15 Conclusion   Ost RCA to Mid RCA lesion, 40% stenosed. The lesion was previously treated with a drug-eluting stent greater than two years ago.  Mid RCA to Dist RCA lesion, 100% stenosed. The lesion was previously treated with a drug-eluting stent greater than two years ago.  Acute Mrg lesion, 90% stenosed.  [place] lesion, 40% stenosed. The lesion was previously treated with a drug-eluting stent greater than two years ago.  Prox Cx to Mid Cx lesion, 100% stenosed. The lesion was previously treated with a drug-eluting stent greater than two years ago.  Ost 2nd Diag to 2nd Diag lesion, 70% stenosed.  Mid LAD to Dist LAD lesion, 50% stenosed.  Prox LAD to Mid LAD lesion, 95% stenosed.  The left ventricular systolic function is normal.   1. Critical 3 vessel obstructive CAD 2. Normal LV function  Plan: Admit telemetry. Start IV heparin and Ntg. CT surgery consult for CABG.    2D ECHO: 08/01/2015 LV EF: 45% -   50% Study Conclusions - Left ventricle: The cavity size was normal. Wall thickness was   normal. Systolic function was mildly reduced. The estimated   ejection fraction was in the range of 45% to 50%. Diffuse   hypokinesis. Left ventricular diastolic function parameters were   normal. - Aorta: Aortic root dimension: 38 mm (ED). - Ascending aorta: The ascending aorta was mildly dilated.    ASSESSMENT & PLAN:  CAD: s/p multiple PCI procedures in the past and now CAGB x5V 2017 . Continue ASA 81 mg daily, statin and BB. No angina stable  Refill for lopressor given   Polysubstance abuse: cocaine and mariajuana in remission   HLD: Tolerating zocor had  myalgias with crestor Needs updated labs   DMT2: Discussed low carb diet.  Target hemoglobin A1c is 6.5 or less.  Continue current medications.  Mild LV dysfunction: EF 45-50%. TTE 07/2015 no signs of CHF observe history of elevated Cr with ACe see below    Ortho:  Right shoulder pain chronic not as bad now that he has help taking care of his mom May delay surgery     Tests:  None    F/U in a year  Jenkins Rouge

## 2019-12-19 NOTE — Telephone Encounter (Signed)
Patient needs fasting lipid and liver panel before appointment. Will have patient schedule renal duplex at the office visit on Thursday.

## 2019-12-20 ENCOUNTER — Other Ambulatory Visit: Payer: Medicaid Other

## 2019-12-20 ENCOUNTER — Other Ambulatory Visit: Payer: Self-pay

## 2019-12-20 DIAGNOSIS — Z951 Presence of aortocoronary bypass graft: Secondary | ICD-10-CM

## 2019-12-21 ENCOUNTER — Other Ambulatory Visit: Payer: Self-pay

## 2019-12-21 ENCOUNTER — Ambulatory Visit (INDEPENDENT_AMBULATORY_CARE_PROVIDER_SITE_OTHER): Payer: Medicaid Other | Admitting: Cardiovascular Disease

## 2019-12-21 ENCOUNTER — Encounter: Payer: Self-pay | Admitting: Cardiovascular Disease

## 2019-12-21 VITALS — BP 110/88 | HR 64 | Ht 71.0 in | Wt 175.0 lb

## 2019-12-21 DIAGNOSIS — Z01810 Encounter for preprocedural cardiovascular examination: Secondary | ICD-10-CM

## 2019-12-21 DIAGNOSIS — E785 Hyperlipidemia, unspecified: Secondary | ICD-10-CM | POA: Diagnosis not present

## 2019-12-21 DIAGNOSIS — I10 Essential (primary) hypertension: Secondary | ICD-10-CM

## 2019-12-21 DIAGNOSIS — I2510 Atherosclerotic heart disease of native coronary artery without angina pectoris: Secondary | ICD-10-CM

## 2019-12-21 LAB — LIPID PANEL
Chol/HDL Ratio: 5.2 ratio — ABNORMAL HIGH (ref 0.0–5.0)
Cholesterol, Total: 264 mg/dL — ABNORMAL HIGH (ref 100–199)
HDL: 51 mg/dL (ref >39)
LDL Chol Calc (NIH): 178 mg/dL — ABNORMAL HIGH (ref 0–99)
Triglycerides: 186 mg/dL — ABNORMAL HIGH (ref 0–149)
VLDL Cholesterol Cal: 35 mg/dL (ref 5–40)

## 2019-12-21 LAB — HEPATIC FUNCTION PANEL
ALT: 32 IU/L (ref 0–44)
AST: 36 IU/L (ref 0–40)
Albumin: 4.1 g/dL (ref 3.8–4.9)
Alkaline Phosphatase: 110 IU/L (ref 44–121)
Bilirubin Total: <0.2 mg/dL (ref 0.0–1.2)
Bilirubin, Direct: <0.1 mg/dL (ref 0.00–0.40)
Total Protein: 7.5 g/dL (ref 6.0–8.5)

## 2019-12-21 MED ORDER — METOPROLOL TARTRATE 25 MG PO TABS: 12.5000 mg | ORAL_TABLET | Freq: Two times a day (BID) | ORAL | 3 refills | Status: DC

## 2019-12-21 MED ORDER — ASPIRIN EC 81 MG PO TBEC: 81.0000 mg | DELAYED_RELEASE_TABLET | Freq: Every day | ORAL | Status: DC

## 2019-12-21 NOTE — Patient Instructions (Addendum)
Medication Instructions:  Your physician recommends that you continue on your current medications as directed. Please refer to the Current Medication list given to you today. 1-Reduce Asprin 81 mg by mouth daily  *If you need a refill on your cardiac medications before your next appointment, please call your pharmacy*  Lab Work: If you have labs (blood work) drawn today and your tests are completely normal, you will receive your results only by: Marland Kitchen MyChart Message (if you have MyChart) OR . A paper copy in the mail If you have any lab test that is abnormal or we need to change your treatment, we will call you to review the results.  Follow-Up: At [facility], you and your health needs are our priority.  As part of our continuing mission to provide you with exceptional heart care, we have created designated Provider Care Teams.  These Care Teams include your primary Cardiologist (physician) and Advanced Practice Providers (APPs -  Physician Assistants and Nurse Practitioners) who all work together to provide you with the care you need, when you need it.  We recommend signing up for the patient portal called "MyChart".  Sign up information is provided on this After Visit Summary.  MyChart is used to connect with patients for Virtual Visits (Telemedicine).  Patients are able to view lab/test results, encounter notes, upcoming appointments, etc.  Non-urgent messages can be sent to your provider as well.   To learn more about what you can do with MyChart, go to [email or web address].    Your next appointment:   1 year(s)  The format for your next appointment:   In Person  Provider:   You may see Charlton Haws, MD or one of the following Advanced Practice Providers on your designated Care Team:    Norma Fredrickson, NP  Nada Boozer, NP  Georgie Chard, NP   You have been referred to Lipid Clinic in 3 months.

## 2019-12-26 ENCOUNTER — Ambulatory Visit: Payer: Medicaid Other

## 2020-01-05 ENCOUNTER — Ambulatory Visit (HOSPITAL_COMMUNITY)
Admission: RE | Admit: 2020-01-05 | Payer: Medicaid Other | Source: Ambulatory Visit | Attending: Cardiovascular Disease | Admitting: Cardiovascular Disease

## 2020-01-26 ENCOUNTER — Ambulatory Visit (HOSPITAL_COMMUNITY)
Admission: RE | Admit: 2020-01-26 | Payer: Medicaid Other | Source: Ambulatory Visit | Attending: Cardiovascular Disease | Admitting: Cardiovascular Disease

## 2020-01-31 ENCOUNTER — Ambulatory Visit: Payer: Medicaid Other

## 2020-02-07 ENCOUNTER — Inpatient Hospital Stay (HOSPITAL_COMMUNITY): Admission: RE | Admit: 2020-02-07 | Payer: Medicaid Other | Source: Ambulatory Visit

## 2020-02-14 ENCOUNTER — Other Ambulatory Visit: Payer: Self-pay

## 2020-02-14 ENCOUNTER — Ambulatory Visit: Payer: Medicaid Other | Attending: Internal Medicine

## 2020-02-14 DIAGNOSIS — Z23 Encounter for immunization: Secondary | ICD-10-CM

## 2020-02-14 NOTE — Progress Notes (Signed)
   Covid-19 Vaccination Clinic  Name:  Willie Walker    MRN: [id number] DOB: [birth year]  02/14/2020  [name] was observed post Covid-19 immunization for 15 minutes without incident. He was provided with Vaccine Information Sheet and instruction to access the V-Safe system.   [name] was instructed to call 911 with any severe reactions post vaccine: Marland Kitchen Difficulty breathing  . Swelling of face and throat  . A fast heartbeat  . A bad rash all over body  . Dizziness and weakness   Immunizations Administered    Name Date Dose VIS Date Route   Moderna Covid-19 Booster Vaccine 02/14/2020 10:50 AM 0.25 mL 11/08/2019 Intramuscular   Manufacturer: Moderna   Lot: [id number]   NDC: 19379-024-09

## 2020-03-05 ENCOUNTER — Other Ambulatory Visit: Payer: Self-pay

## 2020-03-05 ENCOUNTER — Ambulatory Visit (HOSPITAL_COMMUNITY)
Admission: RE | Admit: 2020-03-05 | Discharge: 2020-03-05 | Disposition: A | Discharge status: Home / Self Care | Payer: Medicaid Other | Source: Ambulatory Visit | Attending: Cardiology | Admitting: Cardiology

## 2020-03-05 DIAGNOSIS — Z951 Presence of aortocoronary bypass graft: Secondary | ICD-10-CM | POA: Diagnosis not present

## 2020-03-27 ENCOUNTER — Ambulatory Visit: Payer: Medicaid Other

## 2020-03-27 NOTE — Progress Notes (Deleted)
Patient ID: Willie Walker                 DOB: [birth year]                    MRN: [id number]     HPI: Willie Walker is a 55 y.o. male patient referred to lipid clinic by Dr Johnsie Cancel. PMH is significant for   Current Medications: simvastatin 74m Intolerances:  Risk Factors:  LDL goal:   Diet:   Exercise:   Family History:   Social History:   Labs:  TC 264, HDL 51, Trigs 186, LDL 178 (12/20/19 - on simvastatin 267m  Past Medical History:  Diagnosis Date  . Allergy   . Anginal pain ([facility]  . Anxiety   . Arthritis   . Childhood asthma   . Chronic lower back pain   . Coronary artery disease   . ELECTROCARDIOGRAM, ABNORMAL   . GERD (gastroesophageal reflux disease)   . Gout   . Headache(784.0) 09/18/2011   "maybe twice/wk; pressure on the brain"  . High cholesterol   . Hypertension   . Kidney stones   . Lower GI bleed 09/18/2011   "clots and everything; not lately"  . Myocardial infarction ([facility]/2007  . Peripheral vascular disease (HCC)    LLE  . Pneumonia 1990's  . Seizures ([facility]/30/2013   "blanks out on me"/wife's report- pt states never had a seizure- said he would black out from pain yrs ago but never had a seizure -  08-15-2018  . Shortness of breath 09/18/11   "@ rest; lying down; w/exertion"  . Type I diabetes mellitus ([facility]    Current Outpatient Medications on File Prior to Visit  Medication Sig Dispense Refill  . Accu-Chek FastClix Lancets MISC Use as directed to test blood sugar once daily 102 each 12  . allopurinol (ZYLOPRIM) 300 MG tablet Take 1 tablet (300 mg total) by mouth daily. 30 tablet 6  . aspirin 81 MG tablet Take 1 tablet (81 mg total) by mouth daily.    . Blood Glucose Monitoring Suppl (ACCU-CHEK GUIDE) w/Device KIT 1 each by Does not apply route daily. 1 kit 0  . fish oil-omega-3 fatty acids 1000 MG capsule Take 1 g by mouth 2 (two) times daily.    . Marland Kitchenlucose blood (ACCU-CHEK GUIDE) test strip Use as instructed to test blood sugar once  daily 100 each 12  . metFORMIN (GLUCOPHAGE) 500 MG tablet 2 tabs in the morning and one in the evening with food 270 tablet 1  . metoprolol tartrate (LOPRESSOR) 25 MG tablet Take 0.5 tablets (12.5 mg total) by mouth 2 (two) times daily. 90 tablet 3  . omeprazole (PRILOSEC) 20 MG capsule Take 1 capsule (20 mg total) by mouth daily. Needs appt for more refills. 30 capsule 6  . simvastatin (ZOCOR) 20 MG tablet Take 1 tablet (20 mg total) by mouth at bedtime. 90 tablet 1  . SUPREP BOWEL PREP KIT 17.5-3.13-1.6 GM/177ML SOLN Take by mouth once.     No current facility-administered medications on file prior to visit.    Allergies  Allergen Reactions  . Crestor [Rosuvastatin] Other (See Comments)    "Makes my legs hurt" - tolerates simvastatin     Assessment/Plan:  1. Hyperlipidemia -

## 2020-04-02 ENCOUNTER — Other Ambulatory Visit: Payer: Self-pay

## 2020-04-02 ENCOUNTER — Ambulatory Visit (INDEPENDENT_AMBULATORY_CARE_PROVIDER_SITE_OTHER): Payer: Medicaid Other | Admitting: Student-PharmD

## 2020-04-02 DIAGNOSIS — E782 Mixed hyperlipidemia: Secondary | ICD-10-CM

## 2020-04-02 DIAGNOSIS — E785 Hyperlipidemia, unspecified: Secondary | ICD-10-CM

## 2020-04-02 LAB — LIPID PANEL
Chol/HDL Ratio: 4.6 ratio (ref 0.0–5.0)
Cholesterol, Total: 223 mg/dL — ABNORMAL HIGH (ref 100–199)
HDL: 49 mg/dL (ref >39)
LDL Chol Calc (NIH): 120 mg/dL — ABNORMAL HIGH (ref 0–99)
Triglycerides: 307 mg/dL — ABNORMAL HIGH (ref 0–149)
VLDL Cholesterol Cal: 54 mg/dL — ABNORMAL HIGH (ref 5–40)

## 2020-04-02 LAB — HEPATIC FUNCTION PANEL
ALT: 32 IU/L (ref 0–44)
AST: 34 IU/L (ref 0–40)
Albumin: 4.2 g/dL (ref 3.8–4.9)
Alkaline Phosphatase: 114 IU/L (ref 44–121)
Bilirubin Total: 0.2 mg/dL (ref 0.0–1.2)
Bilirubin, Direct: <0.1 mg/dL (ref 0.00–0.40)
Total Protein: 7.5 g/dL (ref 6.0–8.5)

## 2020-04-02 LAB — LDL CHOLESTEROL, DIRECT: LDL Direct: 123 mg/dL — ABNORMAL HIGH (ref 0–99)

## 2020-04-02 NOTE — Progress Notes (Signed)
Patient ID: Willie Walker                 DOB: 04/27/1965                    MRN: [id number]     HPI: Willie Walker is a 55 y.o. male patient referred to lipid clinic by Dr. Johnsie Cancel. 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 Last EF in 2017 45-50%. PCI's involved full metal jacket to RCA and cutting balloon mid circumflex as well as stents in IM, LAD.   At last visit with Dr. Johnsie Cancel on 12/21/19, patient reported tolerating simvastatin (previously had myalgias with rosuvastatin) and lipid panel was updated. When these results came back with very elevated LDL, patient reported that he had not been taking simvastatin, so simvastatin 20 mg daily was resumed and he was referred to the lipid clinic for further management.   Today, patient reports that he has been taking simvastatin 20 mg daily since his last visit with only about 2 missed doses over the last couple of weeks. Endorses living an active lifestyle but states he does not understand why he has been gaining some weight recently despite only eating one meal per day. He also endorses eating desserts every other day and snacking throughout the day as well. He reports myalgias with rosuvastatin in the past, but states that he does not know why atorvastatin was discontinued, as far as he knows he tolerated this medication well.   Current Medications: Simvastatin 20 mg daily, omega-3 fatty acids 1g BID Previously Tried: Atorvastatin 80 mg daily (unclear why this was discontinued, no reaction per patient) Intolerances: Rosuvastatin 40 mg (myalgias) Risk Factors: CAD s/p CABG, HLD, HTN, PVD, DM LDL goal: <55 mg/dL  Diet:  Reports eating healthy, says he only eats 1 meal/day (sandwich). Snacks throughout the day. Eats desserts/sweets about every other day. Has eaten a chicken and cheese omelet this morning.  Exercise: Walks 5-6 miles daily  Family History: The  patient's family history includes CVA in his father; Dementia in his mother; Heart attack in his maternal grandmother; Hypertension in his mother.     Social History: Former smoker (quit 2007), history of polysubstance abuse  Labs: 12/20/19: TC 264, TG 186, HDL 51, LDL 178 (on no lipid lowering therapy)  Past Medical History:  Diagnosis Date  . Allergy   . Anginal pain ([place])   . Anxiety   . Arthritis   . Childhood asthma   . Chronic lower back pain   . Coronary artery disease   . ELECTROCARDIOGRAM, ABNORMAL   . GERD (gastroesophageal reflux disease)   . Gout   . Headache(784.0) 09/18/2011   "maybe twice/wk; pressure on the brain"  . High cholesterol   . Hypertension   . Kidney stones   . Lower GI bleed 09/18/2011   "clots and everything; not lately"  . Myocardial infarction ([place]) 10/2005  . Peripheral vascular disease (HCC)    LLE  . Pneumonia 1990's  . Seizures ([place]) 09/18/2011   "blanks out on me"/wife's report- pt states never had a seizure- said he would black out from pain yrs ago but never had a seizure -  08-15-2018  . Shortness of breath 09/18/11   "@ rest; lying down; w/exertion"  . Type I diabetes mellitus ([place])     Current Outpatient Medications on File Prior to Visit  Medication Sig Dispense  Refill  . Accu-Chek FastClix Lancets MISC Use as directed to test blood sugar once daily 102 each 12  . allopurinol (ZYLOPRIM) 300 MG tablet Take 1 tablet (300 mg total) by mouth daily. 30 tablet 6  . aspirin 81 MG tablet Take 1 tablet (81 mg total) by mouth daily.    . Blood Glucose Monitoring Suppl (ACCU-CHEK GUIDE) w/Device KIT 1 each by Does not apply route daily. 1 kit 0  . fish oil-omega-3 fatty acids 1000 MG capsule Take 1 g by mouth 2 (two) times daily.    Marland Kitchen glucose blood (ACCU-CHEK GUIDE) test strip Use as instructed to test blood sugar once daily 100 each 12  . metFORMIN (GLUCOPHAGE) 500 MG tablet 2 tabs in the morning and one in the evening with food 270 tablet 1  .  metoprolol tartrate (LOPRESSOR) 25 MG tablet Take 0.5 tablets (12.5 mg total) by mouth 2 (two) times daily. 90 tablet 3  . omeprazole (PRILOSEC) 20 MG capsule Take 1 capsule (20 mg total) by mouth daily. Needs appt for more refills. 30 capsule 6  . simvastatin (ZOCOR) 20 MG tablet Take 1 tablet (20 mg total) by mouth at bedtime. 90 tablet 1  . SUPREP BOWEL PREP KIT 17.5-3.13-1.6 GM/177ML SOLN Take by mouth once.     No current facility-administered medications on file prior to visit.    Allergies  Allergen Reactions  . Crestor [Rosuvastatin] Other (See Comments)    "Makes my legs hurt" - tolerates simvastatin     Assessment/Plan:  1. Hyperlipidemia - LDL elevated above goal <55 mg/dL, with last LDL 178 in 12/2019. Will get updated lipid panel today as patient has now been taking simvastatin 20 mg daily for the past 3 months. Patient ate this morning around 9am so we will also check a direct LDL. Pending results of lipid panel, could consider increasing simvastatin to 40 mg daily or switching to high-intensity atorvastatin since it seems the patient previously tolerated this medication well. However, even with these changes, doubt that LDL will be reduced to goal given significantly elevated baseline LDL in December. Discussed LDL goal and likely need for another medication with patient. Options include PCKS9 inhibitor such as Repatha or Praluent which can help lower LDL by 60%, or Nexlizet which could lower LDL by 40% (less of a preference for this option given patient's gout history). Patient states that he doesn't like needles, however after showing patient demonstration pens and discussing LDL lowering benefits, he would actually prefer PCSK9i over oral therapy with Nexlizet and thinks he would be able to inject medication with no issues. Educated patient on proper administration of PCKS9i and to store in the [place].   Will call patient with lipid panel results and discuss changes in therapy  pending results.    Rebbeca Paul, PharmD PGY1 Pharmacy Resident 04/02/2020 10:48 AM

## 2020-04-02 NOTE — Patient Instructions (Signed)
Nice to see you today!  Keep up the good work with diet and exercise. Aim for a diet full of vegetables, fruit and lean meats (chicken, [place], fish). Try to limit carbs (bread, pasta, sugar, rice) and red meat consumption.  Your goal LDL is less than 55 mg/dL, you're currently at 704 mg/dL (from December lab work). We are checking this again today.   We will call you when your labs result to discuss what medication changes we want to make. The injectable medication we discussed today are called Repatha and Praluent, which both lower your LDL by 60%. These are given every 2 weeks in the fatty tissue of your stomach and stored in the fridge. We also have another medication that is taken by mouth called Nexlizet which can lower your LDL by 40%.   Please give [place] a call at [phone] with any questions or concerns.

## 2020-04-04 ENCOUNTER — Telehealth: Payer: Self-pay | Admitting: Student-PharmD

## 2020-04-04 DIAGNOSIS — E785 Hyperlipidemia, unspecified: Secondary | ICD-10-CM

## 2020-04-04 NOTE — Telephone Encounter (Signed)
Called patient and discussed lab results. LDL has improved from 178 mg/dL 3 months ago to 837 mg/dL with addition of simvastatin 20 mg daily. Patient's LDL goal is <55 mg/dL given history of premature ASCVD. Continue simvastatin 20 mg daily. Triglycerides are increased secondary to patient not fasting when labs were drawn. I have submitted a prior authorization for Repatha and will update the patient when we hear back.

## 2020-04-09 MED ORDER — REPATHA SURECLICK 140 MG/ML ~~LOC~~ SOAJ: 140.0000 mg | SUBCUTANEOUS | 3 refills | Status: DC

## 2020-04-09 NOTE — Telephone Encounter (Signed)
Prior authorization for Repatha has been approved. Sent in prescription for Repatha 140 mg SQ every 14 days to patient's pharmacy. Called patient to inform him of this and to schedule 3 month fasting lipid panel after starting Repatha. Left voicemail requesting call back.

## 2020-04-09 NOTE — Telephone Encounter (Signed)
Called [employer] since have not received response from prior authorization sent on 04/04/20. They have not received prior authorization. Resubmitted PA to [facility].

## 2020-04-11 NOTE — Telephone Encounter (Signed)
Called patient to inform him of PA approval for Repatha and that the prescription has been sent to his pharmacy. Counseled him to inject 1 injection every 14 days and to store medication in the fridge. Scheduled fasting lipid panel/LFTs for 3 months.

## 2020-06-12 DIAGNOSIS — H5213 Myopia, bilateral: Secondary | ICD-10-CM | POA: Diagnosis not present

## 2020-06-12 LAB — HM DIABETES EYE EXAM

## 2020-07-09 ENCOUNTER — Other Ambulatory Visit: Payer: Medicaid Other

## 2020-07-12 ENCOUNTER — Telehealth: Payer: Self-pay | Admitting: Pharmacist

## 2020-07-12 NOTE — Telephone Encounter (Signed)
Pt missed lab work to assess efficacy of Repatha. Called cell, no answer and unable to leave VM. Called home #, his sister answered and provided another cell # to try [phone]. Called and left message.

## 2020-07-16 NOTE — Telephone Encounter (Signed)
2nd message left for pt.

## 2020-07-17 MED ORDER — NEXLIZET 180-10 MG PO TABS: 1.0000 | ORAL_TABLET | Freq: Every day | ORAL | 3 refills | Status: DC

## 2020-07-17 NOTE — Telephone Encounter (Signed)
PA for Nexlizet approved through 07/17/21, pt aware to start therapy. Will recheck lipids 8/31.

## 2020-07-17 NOTE — Telephone Encounter (Signed)
PA denied bc pt does not have active coverage with [employer] anymore. Called his pharmacy, they stated he has a [facility] Medicaid plan now:  BIN [id number] PCN [id number] GRP ACUNC ID [id number] R  Will submit new authorization. Of note, Medicaid card spells pt's first name as Darryl, not Baron as Epic shows.

## 2020-07-17 NOTE — Telephone Encounter (Signed)
Called pt again. He states he never started Repatha because he doesn't want to take an injectable medication. Reminded pt that at his visit in the lipid clinic, both Repatha and Nexlizet were discussed and he chose to start Repatha. Now states he will not since it's an injection. He is agreeable to try Nexlizet and continue on simvastatin 20mg  daily (max tolerated statin dose). LDL-D currently 123 above goal < 55 given premature and extensive CAD s/p CABG, PVD, and DM.  Rx sent to pharmacy, demographic info on PA submitted to [facility] but immediate response received stating "Case cannot be processed electronically. Your request has been received and will be reviewed manually. You will receive a decision via fax within 72 hours." There was no opportunity to attach any clinical info so hopefully we will receive a fax requesting this. Also tried calling Medicaid to see if they could provide me with a fax # so I could preemptively send over clinical info since they never asked for it on the prior auth form. Waited on hold for 50 minutes and no one ever answered.

## 2020-07-24 ENCOUNTER — Other Ambulatory Visit: Payer: Medicaid Other

## 2020-08-02 DIAGNOSIS — H524 Presbyopia: Secondary | ICD-10-CM | POA: Diagnosis not present

## 2020-08-07 ENCOUNTER — Ambulatory Visit: Payer: Medicaid Other | Attending: Nurse Practitioner | Admitting: Nurse Practitioner

## 2020-08-07 ENCOUNTER — Other Ambulatory Visit: Payer: Self-pay

## 2020-08-07 DIAGNOSIS — I10 Essential (primary) hypertension: Secondary | ICD-10-CM | POA: Diagnosis not present

## 2020-08-07 NOTE — Progress Notes (Signed)
Unable to lvm. Will need to reschedule as a new patient.

## 2020-08-07 NOTE — Progress Notes (Signed)
Mr Lanuza had bad phone service. He instructed me to call him at 1030 once he got home.  As of now I have called him 3 times with no answer and phone does not allow voicemails.   Call Times 11:33 am 11:00 am 10:50 am

## 2020-08-08 ENCOUNTER — Other Ambulatory Visit: Payer: Self-pay | Admitting: Nurse Practitioner

## 2020-08-08 DIAGNOSIS — I10 Essential (primary) hypertension: Secondary | ICD-10-CM

## 2020-08-08 DIAGNOSIS — M1000 Idiopathic gout, unspecified site: Secondary | ICD-10-CM

## 2020-08-08 DIAGNOSIS — E1169 Type 2 diabetes mellitus with other specified complication: Secondary | ICD-10-CM

## 2020-08-08 DIAGNOSIS — Z951 Presence of aortocoronary bypass graft: Secondary | ICD-10-CM

## 2020-08-08 DIAGNOSIS — E1149 Type 2 diabetes mellitus with other diabetic neurological complication: Secondary | ICD-10-CM

## 2020-08-08 DIAGNOSIS — R12 Heartburn: Secondary | ICD-10-CM

## 2020-08-08 LAB — LIPID PANEL
Chol/HDL Ratio: 6 ratio — ABNORMAL HIGH (ref 0.0–5.0)
Cholesterol, Total: 246 mg/dL — ABNORMAL HIGH (ref 100–199)
HDL: 41 mg/dL (ref >39)
LDL Chol Calc (NIH): 101 mg/dL — ABNORMAL HIGH (ref 0–99)
Triglycerides: 610 mg/dL (ref 0–149)
VLDL Cholesterol Cal: 104 mg/dL — ABNORMAL HIGH (ref 5–40)

## 2020-08-08 LAB — CMP14+EGFR
ALT: 25 IU/L (ref 0–44)
AST: 28 IU/L (ref 0–40)
Albumin/Globulin Ratio: 1.3 (ref 1.2–2.2)
Albumin: 4 g/dL (ref 3.8–4.9)
Alkaline Phosphatase: 117 IU/L (ref 44–121)
BUN/Creatinine Ratio: 15 (ref 9–20)
BUN: 20 mg/dL (ref 6–24)
Bilirubin Total: <0.2 mg/dL (ref 0.0–1.2)
CO2: 22 mmol/L (ref 20–29)
Calcium: 9.6 mg/dL (ref 8.7–10.2)
Chloride: 103 mmol/L (ref 96–106)
Creatinine, Ser: 1.35 mg/dL — ABNORMAL HIGH (ref 0.76–1.27)
Globulin, Total: 3 g/dL (ref 1.5–4.5)
Glucose: 204 mg/dL — ABNORMAL HIGH (ref 65–99)
Potassium: 5 mmol/L (ref 3.5–5.2)
Sodium: 139 mmol/L (ref 134–144)
Total Protein: 7 g/dL (ref 6.0–8.5)
eGFR: 61 mL/min/{1.73_m2} (ref >59)

## 2020-08-08 LAB — MICROALBUMIN / CREATININE URINE RATIO
Creatinine, Urine: 62.2 mg/dL
Microalb/Creat Ratio: 162 mg/g creat — ABNORMAL HIGH (ref 0–29)
Microalbumin, Urine: 100.5 ug/mL

## 2020-08-08 LAB — CBC
Hematocrit: 39 % (ref 37.5–51.0)
Hemoglobin: 13.4 g/dL (ref 13.0–17.7)
MCH: 30.5 pg (ref 26.6–33.0)
MCHC: 34.4 g/dL (ref 31.5–35.7)
MCV: 89 fL (ref 79–97)
Platelets: 230 10*3/uL (ref 150–450)
RBC: 4.4 x10E6/uL (ref 4.14–5.80)
RDW: 14.5 % (ref 11.6–15.4)
WBC: 6.1 10*3/uL (ref 3.4–10.8)

## 2020-08-08 LAB — HEMOGLOBIN A1C
Est. average glucose Bld gHb Est-mCnc: 171 mg/dL
Hgb A1c MFr Bld: 7.6 % — ABNORMAL HIGH (ref 4.8–5.6)

## 2020-08-08 LAB — PSA: Prostate Specific Ag, Serum: 1.4 ng/mL (ref 0.0–4.0)

## 2020-08-08 MED ORDER — ALLOPURINOL 300 MG PO TABS: 300.0000 mg | ORAL_TABLET | Freq: Every day | ORAL | 6 refills | Status: DC

## 2020-08-08 MED ORDER — ASPIRIN EC 81 MG PO TBEC: 81.0000 mg | DELAYED_RELEASE_TABLET | Freq: Every day | ORAL | 3 refills | Status: AC

## 2020-08-08 MED ORDER — ACCU-CHEK GUIDE VI STRP: ORAL_STRIP | 12 refills | Status: DC

## 2020-08-08 MED ORDER — METOPROLOL TARTRATE 25 MG PO TABS: 12.5000 mg | ORAL_TABLET | Freq: Two times a day (BID) | ORAL | 3 refills | Status: DC

## 2020-08-08 MED ORDER — DAPAGLIFLOZIN PROPANEDIOL 5 MG PO TABS: 5.0000 mg | ORAL_TABLET | Freq: Every day | ORAL | 1 refills | Status: AC

## 2020-08-08 MED ORDER — SIMVASTATIN 40 MG PO TABS: 40.0000 mg | ORAL_TABLET | Freq: Every day | ORAL | 3 refills | Status: DC

## 2020-08-08 MED ORDER — OMEPRAZOLE 20 MG PO CPDR: 20.0000 mg | DELAYED_RELEASE_CAPSULE | Freq: Every day | ORAL | 6 refills | Status: DC

## 2020-08-08 MED ORDER — METFORMIN HCL 500 MG PO TABS: 1000.0000 mg | ORAL_TABLET | Freq: Two times a day (BID) | ORAL | 3 refills | Status: DC

## 2020-08-08 MED ORDER — OMEGA-3-ACID ETHYL ESTERS 1 G PO CAPS: 2.0000 g | ORAL_CAPSULE | Freq: Two times a day (BID) | ORAL | 6 refills | Status: DC

## 2020-08-09 ENCOUNTER — Ambulatory Visit: Payer: Self-pay | Admitting: *Deleted

## 2020-08-09 ENCOUNTER — Telehealth: Payer: Self-pay | Admitting: Nurse Practitioner

## 2020-08-09 ENCOUNTER — Telehealth: Payer: Self-pay

## 2020-08-09 NOTE — Telephone Encounter (Signed)
Patient was called and a voicemail was left informing patient to return phone call for lab results.   CRM created. 

## 2020-08-09 NOTE — Telephone Encounter (Signed)
Patient has not been seen in this clinic since 2020. Will forward message to Zelda. It looks like she sent the rxs.

## 2020-08-09 NOTE — Telephone Encounter (Signed)
Oge calling from [facility] is calling to clarify instructions on metFORMIN (GLUCOPHAGE) 500 MG tablet [[id number]]  Cb- [id number]

## 2020-08-09 NOTE — Telephone Encounter (Signed)
-----   Message from Claiborne Rigg, NP sent at 08/08/2020 10:19 PM EDT ----- CBC does not indicate any anemia or bleeding disorders. Prostate level is normal. Cholesterol levels are very elevated. Increased risk of heart attack or stroke. Kidney, liver function and electrolytes are normal.  A1c in diabetes range. Please pick up new medications from the pharmacy. Reschedule appt. I tried several times to reach him over the phone for his appt on Wednesday

## 2020-08-09 NOTE — Telephone Encounter (Signed)
[facility] pharmacy calling needing clarification on the dosage for the metformin prescribed by Dr. Alvis Lemmings.  There are 2 sets of instructions:  Take 2 tablets twice a day and  Take 2 tablets in the morning and 1 in the evening with a meal.  [phone]  Reason for Disposition  [1] Pharmacy calling with prescription question AND [2] triager unable to answer question  Answer Assessment - Initial Assessment Questions 1. NAME of MEDICATION: "What medicine are you calling about?"     Metformin 500 mg 2. QUESTION: "What is your question?" (e.g., double dose of medicine, side effect)     There are 2 sets of direction.  Need clarification on which set of directions to use for the dosing.   3. PRESCRIBING HCP: "Who prescribed it?" Reason: if prescribed by specialist, call should be referred to that group.     Dr. Alvis Lemmings at [facility] 4. SYMPTOMS: "Do you have any symptoms?"     N/A 5. SEVERITY: If symptoms are present, ask "Are they mild, moderate or severe?"     N/A 6. PREGNANCY:  "Is there any chance that you are pregnant?" "When was your last menstrual period?"     N/A  Protocols used: Medication Question Call-A-AH

## 2020-08-11 ENCOUNTER — Other Ambulatory Visit: Payer: Self-pay | Admitting: Nurse Practitioner

## 2020-08-11 DIAGNOSIS — E1149 Type 2 diabetes mellitus with other diabetic neurological complication: Secondary | ICD-10-CM

## 2020-08-11 MED ORDER — METFORMIN HCL 500 MG PO TABS: 1000.0000 mg | ORAL_TABLET | Freq: Two times a day (BID) | ORAL | 0 refills | Status: DC

## 2020-09-18 ENCOUNTER — Other Ambulatory Visit: Payer: Medicaid Other

## 2020-09-26 ENCOUNTER — Other Ambulatory Visit: Payer: Medicaid Other | Admitting: *Deleted

## 2020-09-26 ENCOUNTER — Other Ambulatory Visit: Payer: Self-pay

## 2020-09-26 DIAGNOSIS — E785 Hyperlipidemia, unspecified: Secondary | ICD-10-CM

## 2020-09-26 LAB — HEPATIC FUNCTION PANEL
ALT: 22 IU/L (ref 0–44)
AST: 37 IU/L (ref 0–40)
Albumin: 4.1 g/dL (ref 3.8–4.9)
Alkaline Phosphatase: 131 IU/L — ABNORMAL HIGH (ref 44–121)
Bilirubin Total: <0.2 mg/dL (ref 0.0–1.2)
Bilirubin, Direct: <0.1 mg/dL (ref 0.00–0.40)
Total Protein: 7.5 g/dL (ref 6.0–8.5)

## 2020-09-26 LAB — LIPID PANEL
Chol/HDL Ratio: 6 ratio — ABNORMAL HIGH (ref 0.0–5.0)
Cholesterol, Total: 263 mg/dL — ABNORMAL HIGH (ref 100–199)
HDL: 44 mg/dL (ref >39)
LDL Chol Calc (NIH): 135 mg/dL — ABNORMAL HIGH (ref 0–99)
Triglycerides: 462 mg/dL — ABNORMAL HIGH (ref 0–149)
VLDL Cholesterol Cal: 84 mg/dL — ABNORMAL HIGH (ref 5–40)

## 2020-09-27 NOTE — Progress Notes (Signed)
F/U lipid clinic consider PSK9 and Vascepa Triglycerides better but still very high And LDL not at target

## 2020-10-02 ENCOUNTER — Telehealth: Payer: Self-pay

## 2020-10-02 NOTE — Telephone Encounter (Signed)
Copied from CRM [id number]. Topic: Referral - Request for Referral >> Sep 30, 2020  2:24 PM Gaetana Michaelis A wrote: Has patient seen PCP for this complaint? No  *If NO, is insurance requiring patient see PCP for this issue before PCP can refer them?  Referral for which specialty: Dentistry    Preferred provider/office: Patient has no preference   Reason for referral: Patient is experiencing tooth pain   Was speaking with Patient and call was disconnected. Before a referral can be place the patient will need to schedule a new patient appt with any provider Per Zelda last encounter on 08/07/20. If patient returns call please schedule an appt or transfer to office.

## 2020-10-24 ENCOUNTER — Telehealth: Payer: Self-pay | Admitting: Student-PharmD

## 2020-10-24 DIAGNOSIS — E785 Hyperlipidemia, unspecified: Secondary | ICD-10-CM

## 2020-10-24 MED ORDER — FENOFIBRATE 48 MG PO TABS: 48.0000 mg | ORAL_TABLET | Freq: Every day | ORAL | 3 refills | Status: DC

## 2020-10-24 NOTE — Telephone Encounter (Signed)
Patient had lipids checked 09/26/20 after he should have started Nexlizet in June. LDL however increased from 101 in July to 135 in September. Triglycerides also elevated to 462. Plan was to inquire whether he was actually taking Nexlizet since this should lower LDL by 40%. He is also supposed to be taking simvastatin 40 mg daily and Lovaza 2g BID. Given TG elevation, plan to also start fenofibrate 48 mg daily (lower dose adjusted for renal impairment) and recheck lipids with direct LDL in 2 months. Unfortunately the patient was not reached and so these changes have not been addressed.   Of note, he was previously approved and agreeable to start Repatha in March, but when [place] checked in with him in June he never started it because he did not want to use injections. Started on Nexlizet at that time (PA approved through 07/17/21), which he was agreeable to doing. LDL goal is <55 given premature and extensive CAD s/p CABG, PVD, and DM.   I spoke with the patient today to review his lab results. He seemed unaware that he was supposed to start taking Nexlizet and has not done this. He has only been taking simvastatin (which I discovered he is only taking 20 mg rather than 40 mg as prescribed) and Lovaza. He mentioned that he has had multiple family members pass away recently and went through a hard time but he wants to work on his health. Explained that he needs to start taking Nexlizet to help his LDL and also take the increased dose of simvastatin 40 mg and he confirms that he will start these. Explained that because his triglycerides are elevated he also needs to start a new medication called fenofibrate. He wrote down the names and doses of each of these 4 medications so he knows what to pick up from the pharmacy. Used teach back method for him to tell me what he needs to pick up and start taking. He is agreeable to these changes and says he will pick up all 4 prescriptions and start taking them. Reiterated  multiple times the importance of taking these medications every day, which he says he will do. I have scheduled him for follow up labs on December 13th (lipid panel, direct LDL, LFTs) which he put on his calendar.

## 2020-10-24 NOTE — Telephone Encounter (Signed)
Agree with plan as documented.

## 2020-11-07 ENCOUNTER — Other Ambulatory Visit (HOSPITAL_COMMUNITY): Payer: Self-pay

## 2020-11-07 MED ORDER — ACETAMINOPHEN-CODEINE #3 300-30 MG PO TABS
1.0000 | ORAL_TABLET | Freq: Four times a day (QID) | ORAL | 0 refills | Status: DC | PRN
  Filled 2020-11-07: qty 12, 3d supply, fill #0

## 2020-11-07 MED ORDER — AMOXICILLIN 500 MG PO CAPS
500.0000 mg | ORAL_CAPSULE | Freq: Three times a day (TID) | ORAL | 0 refills | Status: DC
Start: 2020-11-07 — End: 2021-08-13
  Filled 2020-11-07: qty 21, 7d supply, fill #0

## 2020-12-31 ENCOUNTER — Other Ambulatory Visit: Payer: Medicaid Other

## 2021-01-03 ENCOUNTER — Ambulatory Visit: Payer: Medicaid Other | Admitting: Internal Medicine

## 2021-01-07 ENCOUNTER — Telehealth: Payer: Self-pay | Admitting: Student-PharmD

## 2021-01-07 DIAGNOSIS — Z951 Presence of aortocoronary bypass graft: Secondary | ICD-10-CM

## 2021-01-07 DIAGNOSIS — E785 Hyperlipidemia, unspecified: Secondary | ICD-10-CM

## 2021-01-07 NOTE — Telephone Encounter (Signed)
Patient missed his lab appointment last week to check lipids after starting Nexlizet and fenofibrate and increasing simvastatin. Called patient to reschedule lab appt and to confirm whether he is taking his lipid medications correctly since he has been confused in the past about what he is supposed to be taking. He did not answer and I cannot leave a VM as his mailbox is full.   I called his pharmacy to determine last fill dates for his medications. He is supposed to be taking simvastatin 40 mg daily, fenofibrate 48 mg daily, Nexlizet 180-10 mg daily, Lovaza 2g BID. His pharmacy told me that he has never picked up any of these medications.   No need to reschedule his lipid panel since he is not taking any of his medications. Will try to call him again to determine why he is not taking his medications as he told me at the last phone call that he was going to pick them up and take them. However this is the third time since his initial visit in March that he has been agreeable to a medication plan and confirmed understanding then not taken the medications.

## 2021-01-28 MED ORDER — SIMVASTATIN 40 MG PO TABS: 40.0000 mg | ORAL_TABLET | Freq: Every day | ORAL | 3 refills | Status: DC

## 2021-01-28 MED ORDER — NEXLIZET 180-10 MG PO TABS: 1.0000 | ORAL_TABLET | Freq: Every day | ORAL | 3 refills | Status: DC

## 2021-01-28 MED ORDER — OMEGA-3-ACID ETHYL ESTERS 1 G PO CAPS: 2.0000 g | ORAL_CAPSULE | Freq: Two times a day (BID) | ORAL | 6 refills | Status: DC

## 2021-01-28 MED ORDER — FENOFIBRATE 48 MG PO TABS: 48.0000 mg | ORAL_TABLET | Freq: Every day | ORAL | 3 refills | Status: DC

## 2021-01-28 NOTE — Addendum Note (Signed)
Addended by: Pervis Hocking B on: 01/28/2021 11:35 AM   Modules accepted: Orders

## 2021-01-28 NOTE — Telephone Encounter (Signed)
Called patient for the third time and was able to reach him. He reports he is taking the increased dose of simvastatin (which his pharmacy told me he had not yet picked up) but he is not taking the other medications because when he went to the pharmacy they didn't give them to him. This is likely because he went after they had already returned them to stock. I have resent all of his prescriptions in hopes that he does pick them up and start taking them which he says he will do. I scheduled him for follow up labs in 2 months.

## 2021-02-04 ENCOUNTER — Telehealth: Payer: Self-pay

## 2021-02-04 NOTE — Telephone Encounter (Signed)
**Note De-Identified Jimmi Sidener Obfuscation** Omega-3 acid ethyl esters PA started through covermymeds. Key: [id number]

## 2021-03-20 ENCOUNTER — Other Ambulatory Visit: Payer: Medicaid Other

## 2021-07-02 DIAGNOSIS — H5213 Myopia, bilateral: Secondary | ICD-10-CM | POA: Diagnosis not present

## 2021-07-02 DIAGNOSIS — H2513 Age-related nuclear cataract, bilateral: Secondary | ICD-10-CM | POA: Diagnosis not present

## 2021-07-02 DIAGNOSIS — H16223 Keratoconjunctivitis sicca, not specified as Sjogren's, bilateral: Secondary | ICD-10-CM | POA: Diagnosis not present

## 2021-07-02 DIAGNOSIS — H0288B Meibomian gland dysfunction left eye, upper and lower eyelids: Secondary | ICD-10-CM | POA: Diagnosis not present

## 2021-07-02 DIAGNOSIS — E119 Type 2 diabetes mellitus without complications: Secondary | ICD-10-CM | POA: Diagnosis not present

## 2021-07-02 DIAGNOSIS — H0288A Meibomian gland dysfunction right eye, upper and lower eyelids: Secondary | ICD-10-CM | POA: Diagnosis not present

## 2021-08-11 ENCOUNTER — Other Ambulatory Visit: Payer: Self-pay | Admitting: Nurse Practitioner

## 2021-08-11 DIAGNOSIS — R12 Heartburn: Secondary | ICD-10-CM

## 2021-08-11 DIAGNOSIS — I10 Essential (primary) hypertension: Secondary | ICD-10-CM

## 2021-08-13 ENCOUNTER — Ambulatory Visit: Payer: Medicaid Other | Attending: Nurse Practitioner | Admitting: Nurse Practitioner

## 2021-08-13 ENCOUNTER — Other Ambulatory Visit (HOSPITAL_COMMUNITY)
Admission: RE | Admit: 2021-08-13 | Discharge: 2021-08-13 | Disposition: A | Discharge status: Home / Self Care | Payer: Medicaid Other | Source: Ambulatory Visit | Attending: Nurse Practitioner | Admitting: Nurse Practitioner

## 2021-08-13 ENCOUNTER — Encounter: Payer: Self-pay | Admitting: Nurse Practitioner

## 2021-08-13 VITALS — BP 117/78 | HR 61 | Temp 98.0°F | Resp 16 | Wt 160.2 lb

## 2021-08-13 DIAGNOSIS — Z7689 Persons encountering health services in other specified circumstances: Secondary | ICD-10-CM

## 2021-08-13 DIAGNOSIS — K219 Gastro-esophageal reflux disease without esophagitis: Secondary | ICD-10-CM | POA: Diagnosis not present

## 2021-08-13 DIAGNOSIS — Z7251 High risk heterosexual behavior: Secondary | ICD-10-CM | POA: Insufficient documentation

## 2021-08-13 DIAGNOSIS — Z125 Encounter for screening for malignant neoplasm of prostate: Secondary | ICD-10-CM

## 2021-08-13 DIAGNOSIS — Z114 Encounter for screening for human immunodeficiency virus [HIV]: Secondary | ICD-10-CM | POA: Diagnosis not present

## 2021-08-13 DIAGNOSIS — E1149 Type 2 diabetes mellitus with other diabetic neurological complication: Secondary | ICD-10-CM

## 2021-08-13 DIAGNOSIS — M1000 Idiopathic gout, unspecified site: Secondary | ICD-10-CM | POA: Diagnosis not present

## 2021-08-13 DIAGNOSIS — I25119 Atherosclerotic heart disease of native coronary artery with unspecified angina pectoris: Secondary | ICD-10-CM | POA: Diagnosis not present

## 2021-08-13 DIAGNOSIS — I10 Essential (primary) hypertension: Secondary | ICD-10-CM

## 2021-08-13 DIAGNOSIS — Z1211 Encounter for screening for malignant neoplasm of colon: Secondary | ICD-10-CM

## 2021-08-13 DIAGNOSIS — E785 Hyperlipidemia, unspecified: Secondary | ICD-10-CM

## 2021-08-13 DIAGNOSIS — E1169 Type 2 diabetes mellitus with other specified complication: Secondary | ICD-10-CM

## 2021-08-13 DIAGNOSIS — R12 Heartburn: Secondary | ICD-10-CM

## 2021-08-13 MED ORDER — NEXLIZET 180-10 MG PO TABS: 1.0000 | ORAL_TABLET | Freq: Every day | ORAL | 3 refills | Status: DC

## 2021-08-13 MED ORDER — OMEGA-3-ACID ETHYL ESTERS 1 G PO CAPS
2.0000 g | ORAL_CAPSULE | Freq: Two times a day (BID) | ORAL | 6 refills | Status: DC
Start: 2021-08-13 — End: 2022-09-02

## 2021-08-13 MED ORDER — ALLOPURINOL 300 MG PO TABS: 300.0000 mg | ORAL_TABLET | Freq: Every day | ORAL | 6 refills | Status: DC

## 2021-08-13 MED ORDER — ACCU-CHEK GUIDE W/DEVICE KIT: 1.0000 | PACK | Freq: Every day | 0 refills | Status: DC

## 2021-08-13 MED ORDER — METOPROLOL TARTRATE 25 MG PO TABS: 12.5000 mg | ORAL_TABLET | Freq: Two times a day (BID) | ORAL | 3 refills | Status: DC

## 2021-08-13 MED ORDER — ACCU-CHEK GUIDE VI STRP: ORAL_STRIP | 12 refills | Status: DC

## 2021-08-13 MED ORDER — FENOFIBRATE 48 MG PO TABS: 48.0000 mg | ORAL_TABLET | Freq: Every day | ORAL | 3 refills | Status: DC

## 2021-08-13 MED ORDER — ACCU-CHEK FASTCLIX LANCETS MISC: 12 refills | Status: DC

## 2021-08-13 MED ORDER — METFORMIN HCL 500 MG PO TABS: 1000.0000 mg | ORAL_TABLET | Freq: Two times a day (BID) | ORAL | 3 refills | Status: DC

## 2021-08-13 MED ORDER — OMEPRAZOLE 20 MG PO CPDR: 20.0000 mg | DELAYED_RELEASE_CAPSULE | Freq: Every day | ORAL | 6 refills | Status: DC

## 2021-08-13 NOTE — Progress Notes (Signed)
Assessment & Plan:  Willie Walker was seen today for shoulder pain and establish care.  Diagnoses and all orders for this visit:  Encounter to establish care  Type 2 diabetes mellitus with other specified complication, without long-term current use of insulin ([place]) -     metFORMIN (GLUCOPHAGE) 500 MG tablet; Take 2 tablets (1,000 mg total) by mouth 2 (two) times daily with a meal. = -     Discontinue: glucose blood (ACCU-CHEK GUIDE) test strip; Use as instructed to test blood sugar once daily -     CMP14+EGFR -     Hemoglobin A1c -     Blood Glucose Monitoring Suppl (ACCU-CHEK GUIDE) w/Device KIT; 1 each by Does not apply route daily. -     glucose blood (ACCU-CHEK GUIDE) test strip; Use as instructed to test blood sugar once daily -     Accu-Chek FastClix Lancets MISC; Use as directed to test blood sugar once daily  Primary hypertension -     metoprolol tartrate (LOPRESSOR) 25 MG tablet; Take 0.5 tablets (12.5 mg total) by mouth 2 (two) times daily. -     CMP14+EGFR  Dyslipidemia, goal LDL below 70 -     fenofibrate (TRICOR) 48 MG tablet; Take 1 tablet (48 mg total) by mouth daily. -     Lipid panel  Gastroesophageal reflux disease without esophagitis -     omeprazole (PRILOSEC) 20 MG capsule; Take 1 capsule (20 mg total) by mouth daily. Needs appt for more refills.  Acute idiopathic gout, unspecified site -     allopurinol (ZYLOPRIM) 300 MG tablet; Take 1 tablet (300 mg total) by mouth daily.  Colon cancer screening -     Ambulatory referral to Gastroenterology  High risk heterosexual behavior -     Urine cytology ancillary only  Prostate cancer screening -     PSA  Encounter for screening for HIV -     HIV antibody (with reflex)  Coronary artery disease involving native coronary artery of native heart with angina pectoris (HCC) -     fenofibrate (TRICOR) 48 MG tablet; Take 1 tablet (48 mg total) by mouth daily. -     Bempedoic Acid-Ezetimibe (NEXLIZET) 180-10 MG TABS; Take  1 tablet by mouth daily. -     omega-3 acid ethyl esters (LOVAZA) 1 g capsule; Take 2 capsules (2 g total) by mouth 2 (two) times daily. -     Lipid panel    Patient has been counseled on age-appropriate routine health concerns for screening and prevention. These are reviewed and up-to-date. Referrals have been placed accordingly. Immunizations are up-to-date or declined.    Subjective:   Chief Complaint  Patient presents with   Shoulder Pain    Right and Left shoulder pain, right ribs pain   Establish Care   HPI Willie Walker 56 y.o. male presents to office today to establish care.  PMH: Seasonal Allergies, Anxiety, Arthritis, Childhood asthma, Chronic lower back pain, Coronary artery disease, GERD, Gout, High cholesterol, Hypertension, Kidney stones, Lower GI bleed (09/18/2011), Myocardial infarction (10/2005), Peripheral vascular disease, Seizures  (09/18/2011),  Type 2 diabetes mellitus   Patient has been counseled on age-appropriate routine health concerns for screening and prevention. These are reviewed and up-to-date. Referrals have been placed accordingly. Immunizations are up-to-date or declined.     PSA: ordered today COLONOSCOPY: referred to GI  Has complaints of Right shoulder pain. Needs to follow up with Dr. Erlinda Hong for this as he was expected to have right  shoulder arthroscopy in 2021 and the surgery was canceled.    HTN Blood pressure is well controlled. Taking lopressor 12.5 mg BID as prescribed.  BP Readings from Last 3 Encounters:  08/13/21 117/78  12/21/19 110/88  07/13/19 107/73    DM 2 Diabetes is poorly controlled. Will refill metformin 1000 mg BID and start Jardiance 10 mg and Lantus 10 units nightly. Will need to return in 4 weeks for meter check. LDL not at goal. Needs to follow up with cardiology as well. Currently prescribed nexlizet 180-10 mg daily, lovaza 2g BID and tricor 48 mg daily. Feels discomfort in left chest at times. Not related to activity. Occurs  at rest. Last a few seconds and goes away on its own. Last occurrence was 7 days ago.  Also needs to resume PPI.  He is not taking any of his cholesterol medication as prescribed and states he does not want to take any medications that have potential side effects. I did explain to him that not taking his mediations increases his risk for another heart attack or stroke. He denies any current chest pain or shortness of breath.    Review of Systems  Constitutional:  Negative for fever, malaise/fatigue and weight loss.  HENT: Negative.  Negative for nosebleeds.   Eyes: Negative.  Negative for blurred vision, double vision and photophobia.  Respiratory: Negative.  Negative for cough and shortness of breath.   Cardiovascular:  Positive for chest pain. Negative for palpitations and leg swelling.  Gastrointestinal:  Positive for heartburn. Negative for nausea and vomiting.  Musculoskeletal: Negative.  Negative for myalgias.  Neurological: Negative.  Negative for dizziness, focal weakness, seizures and headaches.  Psychiatric/Behavioral: Negative.  Negative for suicidal ideas.     Past Medical History:  Diagnosis Date   Allergy    Anginal pain ([place])    Anxiety    Arthritis    Childhood asthma    Chronic lower back pain    Coronary artery disease    Diabetes mellitus without complication ([place])    ELECTROCARDIOGRAM, ABNORMAL    GERD (gastroesophageal reflux disease)    Gout    Headache(784.0) 09/18/2011   "maybe twice/wk; pressure on the brain"   High cholesterol    Hypertension    Kidney stones    Lower GI bleed 09/18/2011   "clots and everything; not lately"   Myocardial infarction [facility]) 10/2005   Peripheral vascular disease ([place])    LLE   Pneumonia 1990's   Seizures ([place]) 09/18/2011   "blanks out on me"/wife's report- pt states never had a seizure- said he would black out from pain yrs ago but never had a seizure -  08-15-2018   Shortness of breath 09/18/2011   "@ rest; lying down;  w/exertion"    Past Surgical History:  Procedure Laterality Date   [place]   "mugged"   CARDIAC CATHETERIZATION N/A 08/01/2015   Procedure: Left Heart Cath and Coronary Angiography;  Surgeon: Peter M Martinique, MD;  Location: [facility];  Service: Cardiovascular;  Laterality: N/A;   CORONARY ANGIOPLASTY  09/18/2011   CORONARY ANGIOPLASTY WITH STENT PLACEMENT  10/2005   "9"   CORONARY ARTERY BYPASS GRAFT N/A 08/05/2015   Procedure: CORONARY ARTERY BYPASS GRAFTING (CABG), ON PUMP, TIMES FIVE, USING LEFT INTERNAL MAMMARY ARTERY AND LEFT RADIAL ARTERY, RIGHT GREATER SAPHENOUS VEIN HARVESTED ENDOSCOPICALLY;  Surgeon: Melrose Nakayama, MD;  Location: [facility];  Service: Open Heart Surgery;  Laterality: N/A;   LACERATION  REPAIR  1999   BLE "mugging"   PERCUTANEOUS CORONARY STENT INTERVENTION (PCI-S) N/A 09/18/2011   Procedure: PERCUTANEOUS CORONARY STENT INTERVENTION (PCI-S);  Surgeon: Peter M Martinique, MD;  Location: [facility];  Service: Cardiovascular;  Laterality: N/A;   RADIAL ARTERY HARVEST Left 08/05/2015   Procedure: RADIAL ARTERY HARVEST;  Surgeon: Melrose Nakayama, MD;  Location: [facility];  Service: Open Heart Surgery;  Laterality: Left;   TEE WITHOUT CARDIOVERSION N/A 08/05/2015   Procedure: TRANSESOPHAGEAL ECHOCARDIOGRAM (TEE);  Surgeon: Melrose Nakayama, MD;  Location: [facility];  Service: Open Heart Surgery;  Laterality: N/A;    Family History  Problem Relation Age of Onset   Hypertension Mother    Dementia Mother    CVA Father    Heart attack Maternal Grandmother    Colon cancer Neg Hx    Colon polyps Neg Hx    Esophageal cancer Neg Hx    Rectal cancer Neg Hx    Stomach cancer Neg Hx     Social History Reviewed with no changes to be made today.   Outpatient Medications Prior to Visit  Medication Sig Dispense Refill   acetaminophen-codeine (TYLENOL #3) 300-30 MG tablet Take 1 tablet by mouth every 6-8 hours as needed for pain 12 tablet 0    simvastatin (ZOCOR) 40 MG tablet Take 1 tablet (40 mg total) by mouth at bedtime. 90 tablet 3   Blood Glucose Monitoring Suppl (ACCU-CHEK GUIDE) w/Device KIT 1 each by Does not apply route daily. 1 kit 0   glucose blood (ACCU-CHEK GUIDE) test strip Use as instructed to test blood sugar once daily 100 each 12   metoprolol tartrate (LOPRESSOR) 25 MG tablet Take 0.5 tablets (12.5 mg total) by mouth 2 (two) times daily. 90 tablet 3   SUPREP BOWEL PREP KIT 17.5-3.13-1.6 GM/177ML SOLN Take by mouth once.     Accu-Chek FastClix Lancets MISC Use as directed to test blood sugar once daily (Patient not taking: Reported on 08/13/2021) 102 each 12   allopurinol (ZYLOPRIM) 300 MG tablet Take 1 tablet (300 mg total) by mouth daily. (Patient not taking: Reported on 08/13/2021) 30 tablet 6   amoxicillin (AMOXIL) 500 MG capsule Take 1 capsule (500 mg total) by mouth every 8 (eight) hours for 7 days. (Patient not taking: Reported on 08/13/2021) 21 capsule 0   Bempedoic Acid-Ezetimibe (NEXLIZET) 180-10 MG TABS Take 1 tablet by mouth daily. (Patient not taking: Reported on 08/13/2021) 90 tablet 3   fenofibrate (TRICOR) 48 MG tablet Take 1 tablet (48 mg total) by mouth daily. (Patient not taking: Reported on 08/13/2021) 90 tablet 3   metFORMIN (GLUCOPHAGE) 500 MG tablet Take 2 tablets (1,000 mg total) by mouth 2 (two) times daily with a meal. = 360 tablet 0   omega-3 acid ethyl esters (LOVAZA) 1 g capsule Take 2 capsules (2 g total) by mouth 2 (two) times daily. (Patient not taking: Reported on 08/13/2021) 120 capsule 6   omeprazole (PRILOSEC) 20 MG capsule Take 1 capsule (20 mg total) by mouth daily. Needs appt for more refills. (Patient not taking: Reported on 08/13/2021) 30 capsule 6   No facility-administered medications prior to visit.    Allergies  Allergen Reactions   Crestor [Rosuvastatin] Other (See Comments)    "Makes my legs hurt" - tolerates simvastatin        Objective:    BP 117/78 (BP Location: Left  Arm, Patient Position: Sitting, Cuff Size: Small)   Pulse 61   Temp 98 F (36.7  C) (Oral)   Resp 16   Wt 160 lb 3.2 oz (72.7 kg)   SpO2 98%   BMI 22.34 kg/m  Wt Readings from Last 3 Encounters:  08/13/21 160 lb 3.2 oz (72.7 kg)  12/21/19 175 lb (79.4 kg)  08/22/19 157 lb (71.2 kg)    Physical Exam Vitals and nursing note reviewed.  Constitutional:      Appearance: He is well-developed.  HENT:     Head: Normocephalic and atraumatic.  Cardiovascular:     Rate and Rhythm: Normal rate and regular rhythm.     Heart sounds: Normal heart sounds. No murmur heard.    No friction rub. No gallop.  Pulmonary:     Effort: Pulmonary effort is normal. No tachypnea or respiratory distress.     Breath sounds: Normal breath sounds. No decreased breath sounds, wheezing, rhonchi or rales.  Chest:     Chest wall: No tenderness.  Abdominal:     General: Bowel sounds are normal.     Palpations: Abdomen is soft.  Musculoskeletal:        General: Normal range of motion.     Cervical back: Normal range of motion.  Skin:    General: Skin is warm and dry.  Neurological:     Mental Status: He is alert and oriented to person, place, and time.     Coordination: Coordination normal.  Psychiatric:        Behavior: Behavior normal. Behavior is cooperative.        Thought Content: Thought content normal.        Judgment: Judgment normal.          Patient has been counseled extensively about nutrition and exercise as well as the importance of adherence with medications and regular follow-up. The patient was given clear instructions to go to ER or return to medical center if symptoms don't improve, worsen or new problems develop. The patient verbalized understanding.   Follow-up: Return in about 3 months (around 11/13/2021).   Gildardo Pounds, FNP-BC [facility] and [facility] [place], [place]   08/18/2021, 10:30 PM

## 2021-08-13 NOTE — Patient Instructions (Addendum)
Please call Dr. Roda Shutters for Shoulder Naiping Glee Arvin, MD Address: [address] [place], [place] [address] Phone: [phone]   Call Cardiology to schedule Noralyn Pick. Eden Emms, MD Address: [address], [place], [place] [address] Phone: [phone]

## 2021-08-13 NOTE — Progress Notes (Signed)
Feeling pain on the R Shoulder, L shoulder, R ribs. Going on for 6 months Pain is 4 Screenx for STD

## 2021-08-14 LAB — CMP14+EGFR
ALT: 18 IU/L (ref 0–44)
AST: 26 IU/L (ref 0–40)
Albumin/Globulin Ratio: 1.3 (ref 1.2–2.2)
Albumin: 4.4 g/dL (ref 3.8–4.9)
Alkaline Phosphatase: 114 IU/L (ref 44–121)
BUN/Creatinine Ratio: 20 (ref 9–20)
BUN: 28 mg/dL — ABNORMAL HIGH (ref 6–24)
Bilirubin Total: <0.2 mg/dL (ref 0.0–1.2)
CO2: 25 mmol/L (ref 20–29)
Calcium: 9.8 mg/dL (ref 8.7–10.2)
Chloride: 100 mmol/L (ref 96–106)
Creatinine, Ser: 1.39 mg/dL — ABNORMAL HIGH (ref 0.76–1.27)
Globulin, Total: 3.3 g/dL (ref 1.5–4.5)
Glucose: 406 mg/dL — ABNORMAL HIGH (ref 70–99)
Potassium: 4.8 mmol/L (ref 3.5–5.2)
Sodium: 140 mmol/L (ref 134–144)
Total Protein: 7.7 g/dL (ref 6.0–8.5)
eGFR: 59 mL/min/{1.73_m2} — ABNORMAL LOW (ref >59)

## 2021-08-14 LAB — LIPID PANEL
Chol/HDL Ratio: 4.8 ratio (ref 0.0–5.0)
Cholesterol, Total: 213 mg/dL — ABNORMAL HIGH (ref 100–199)
HDL: 44 mg/dL (ref >39)
LDL Chol Calc (NIH): 113 mg/dL — ABNORMAL HIGH (ref 0–99)
Triglycerides: 325 mg/dL — ABNORMAL HIGH (ref 0–149)
VLDL Cholesterol Cal: 56 mg/dL — ABNORMAL HIGH (ref 5–40)

## 2021-08-14 LAB — URINE CYTOLOGY ANCILLARY ONLY
Bacterial Vaginitis-Urine: NEGATIVE
Candida Urine: NEGATIVE
Chlamydia: NEGATIVE
Comment: NEGATIVE
Comment: NEGATIVE
Comment: NORMAL
Neisseria Gonorrhea: NEGATIVE
Trichomonas: NEGATIVE

## 2021-08-14 LAB — PSA: Prostate Specific Ag, Serum: 1.3 ng/mL (ref 0.0–4.0)

## 2021-08-14 LAB — HEMOGLOBIN A1C
Est. average glucose Bld gHb Est-mCnc: >398 mg/dL
Hgb A1c MFr Bld: >15.5 % — ABNORMAL HIGH (ref 4.8–5.6)

## 2021-08-14 LAB — HIV ANTIBODY (ROUTINE TESTING W REFLEX): HIV Screen 4th Generation wRfx: NONREACTIVE

## 2021-08-18 ENCOUNTER — Encounter: Payer: Self-pay | Admitting: Nurse Practitioner

## 2021-08-18 MED ORDER — EMPAGLIFLOZIN 10 MG PO TABS: 10.0000 mg | ORAL_TABLET | Freq: Every day | ORAL | 1 refills | Status: DC

## 2021-08-18 MED ORDER — LANTUS SOLOSTAR 100 UNIT/ML ~~LOC~~ SOPN: 10.0000 [IU] | PEN_INJECTOR | Freq: Every day | SUBCUTANEOUS | 99 refills | Status: DC

## 2021-08-21 ENCOUNTER — Encounter: Payer: Self-pay | Admitting: *Deleted

## 2021-09-01 ENCOUNTER — Telehealth: Payer: Self-pay

## 2021-09-01 NOTE — Telephone Encounter (Signed)
Noted patient has been given results

## 2021-09-01 NOTE — Telephone Encounter (Signed)
Pt called to review lab results after receiving letter in the mail. Shared provider's note with pt. PT has glucose meter and has picked up medications from pharmacy. PT will make an appt with cardiology.  No further questions.  Claiborne Rigg, NP  08/18/2021 11:02 PM EDT     A1c is >15.Should be less than 7. I have added 2 additional diabetes medications: Insulin for nighttime and jardiance pill for daytime. Pick these up from [facility]. Continue metformin. Will need to see LUKE on September 5th at 130 and bring his meter that was sent to pharmacy. If he is not able to get his meter he needs to let us know as soon as possible.    Kidney function shows slight decline (take all meds as prescribed), liver function and electrolytes are normal.     Cholesterol too high. Increased risk of heart attack or stroke. Take all 3 cholesterol meds as prescribed. Follow up with cardiology as soon as possible.    Normal prostate level, HIV negative, Urine cytology neg for any STIs. We did not test for syphilis.

## 2021-09-23 ENCOUNTER — Ambulatory Visit: Payer: Medicaid Other | Admitting: Pharmacist

## 2021-11-19 ENCOUNTER — Ambulatory Visit: Payer: Medicaid Other | Admitting: Nurse Practitioner

## 2021-12-15 ENCOUNTER — Encounter: Payer: Self-pay | Admitting: Family Medicine

## 2021-12-23 ENCOUNTER — Encounter: Payer: Self-pay | Admitting: Gastroenterology

## 2022-01-16 ENCOUNTER — Telehealth: Payer: Self-pay | Admitting: *Deleted

## 2022-01-16 NOTE — Telephone Encounter (Signed)
Attempted to contact pt @ [phone] x [phone] received message that call can not be completed as dialed. Attempted [phone] [phone] mailbox is full and unable to leave message. A no show letter will be sent and pre-visit and procedure cancelled.

## 2022-02-12 ENCOUNTER — Encounter: Payer: Medicaid Other | Admitting: Gastroenterology

## 2022-05-04 ENCOUNTER — Other Ambulatory Visit: Payer: Self-pay | Admitting: Cardiovascular Disease

## 2022-05-04 DIAGNOSIS — Z951 Presence of aortocoronary bypass graft: Secondary | ICD-10-CM

## 2022-09-02 ENCOUNTER — Encounter: Payer: Self-pay | Admitting: Physician Assistant

## 2022-09-02 ENCOUNTER — Ambulatory Visit: Payer: Medicaid Other | Attending: Physician Assistant | Admitting: Physician Assistant

## 2022-09-02 VITALS — BP 138/79 | HR 56 | Wt 163.2 lb

## 2022-09-02 DIAGNOSIS — M25511 Pain in right shoulder: Secondary | ICD-10-CM | POA: Diagnosis not present

## 2022-09-02 DIAGNOSIS — E1165 Type 2 diabetes mellitus with hyperglycemia: Secondary | ICD-10-CM | POA: Diagnosis not present

## 2022-09-02 DIAGNOSIS — Z7984 Long term (current) use of oral hypoglycemic drugs: Secondary | ICD-10-CM | POA: Diagnosis not present

## 2022-09-02 DIAGNOSIS — I10 Essential (primary) hypertension: Secondary | ICD-10-CM | POA: Diagnosis not present

## 2022-09-02 DIAGNOSIS — G8929 Other chronic pain: Secondary | ICD-10-CM

## 2022-09-02 DIAGNOSIS — M1000 Idiopathic gout, unspecified site: Secondary | ICD-10-CM | POA: Diagnosis not present

## 2022-09-02 DIAGNOSIS — K219 Gastro-esophageal reflux disease without esophagitis: Secondary | ICD-10-CM | POA: Diagnosis not present

## 2022-09-02 DIAGNOSIS — E785 Hyperlipidemia, unspecified: Secondary | ICD-10-CM

## 2022-09-02 DIAGNOSIS — I25119 Atherosclerotic heart disease of native coronary artery with unspecified angina pectoris: Secondary | ICD-10-CM | POA: Diagnosis not present

## 2022-09-02 DIAGNOSIS — Z951 Presence of aortocoronary bypass graft: Secondary | ICD-10-CM | POA: Diagnosis not present

## 2022-09-02 LAB — POCT GLYCOSYLATED HEMOGLOBIN (HGB A1C): HbA1c, POC (controlled diabetic range): 8.4 % — AB (ref 0.0–7.0)

## 2022-09-02 LAB — GLUCOSE, POCT (MANUAL RESULT ENTRY): POC Glucose: 295 mg/dl — AB (ref 70–99)

## 2022-09-02 MED ORDER — ALLOPURINOL 300 MG PO TABS
300.0000 mg | ORAL_TABLET | Freq: Every day | ORAL | 6 refills | Status: DC
Start: 2022-09-02 — End: 2023-04-15

## 2022-09-02 MED ORDER — OMEGA-3-ACID ETHYL ESTERS 1 G PO CAPS
2.0000 g | ORAL_CAPSULE | Freq: Two times a day (BID) | ORAL | 6 refills | Status: DC
Start: 2022-09-02 — End: 2022-11-09

## 2022-09-02 MED ORDER — METFORMIN HCL 500 MG PO TABS
1000.0000 mg | ORAL_TABLET | Freq: Two times a day (BID) | ORAL | 3 refills | Status: DC
Start: 2022-09-02 — End: 2023-04-15

## 2022-09-02 MED ORDER — OMEPRAZOLE 20 MG PO CPDR
20.0000 mg | DELAYED_RELEASE_CAPSULE | Freq: Every day | ORAL | 6 refills | Status: DC
Start: 2022-09-02 — End: 2023-04-15

## 2022-09-02 MED ORDER — METOPROLOL TARTRATE 25 MG PO TABS: 12.5000 mg | ORAL_TABLET | Freq: Two times a day (BID) | ORAL | 3 refills | Status: DC

## 2022-09-02 MED ORDER — FENOFIBRATE 48 MG PO TABS
48.0000 mg | ORAL_TABLET | Freq: Every day | ORAL | 3 refills | Status: DC
Start: 2022-09-02 — End: 2022-11-09

## 2022-09-02 NOTE — Progress Notes (Signed)
Patient ID: Willie Walker, male   DOB: Jun 05, 1965, 57 y.o.   MRN: [id number]   Willie Walker, is a 57 y.o. male  [id number]  [id number]  DOB - 1965-05-12  Chief Complaint  Patient presents with   Shoulder Pain   Medication Refill   Diabetes       Subjective:   Willie Walker is a 57 y.o. male here today for med RF. He ran out of the ones he was taking about 10 days ago.  He had been caring for his mom who passed in April and he wants to take better care of himself.   He has been taking metformin 500mg  bid(sometimes misses a dose), allopurinol.  He never took lantus or jardiance.  He is out of fenofibrate and statin for a while.  Needs to schedule appt with Dr Eden Emms.    Would like referral back to Dr Roda Shutters.  He had surgery scheduled then lost to f/up for R shoulder when his mom's conditions worsened  No problems updated.  ALLERGIES: Allergies  Allergen Reactions   Crestor [Rosuvastatin] Other (See Comments)    "Makes my legs hurt" - tolerates simvastatin     PAST MEDICAL HISTORY: Past Medical History:  Diagnosis Date   Allergy    Anginal pain (HCC)    Anxiety    Arthritis    Childhood asthma    Chronic lower back pain    Coronary artery disease    Diabetes mellitus without complication (HCC)    ELECTROCARDIOGRAM, ABNORMAL    GERD (gastroesophageal reflux disease)    Gout    Headache(784.0) 09/18/2011   "maybe twice/wk; pressure on the brain"   High cholesterol    Hypertension    Kidney stones    Lower GI bleed 09/18/2011   "clots and everything; not lately"   Myocardial infarction [facility]) 10/2005   Peripheral vascular disease (HCC)    LLE   Pneumonia 1990's   Seizures (HCC) 09/18/2011   "blanks out on me"/wife's report- pt states never had a seizure- said he would black out from pain yrs ago but never had a seizure -  08-15-2018   Shortness of breath 09/18/2011   "@ rest; lying down; w/exertion"    MEDICATIONS AT HOME: Prior to Admission medications    Medication Sig Start Date End Date Taking? Authorizing Provider  Accu-Chek FastClix Lancets MISC Use as directed to test blood sugar once daily 08/13/21   Claiborne Rigg, NP  acetaminophen-codeine (TYLENOL #3) 300-30 MG tablet Take 1 tablet by mouth every 6-8 hours as needed for pain 11/07/20     allopurinol (ZYLOPRIM) 300 MG tablet Take 1 tablet (300 mg total) by mouth daily. 09/02/22   Anders Simmonds, PA-C  Blood Glucose Monitoring Suppl (ACCU-CHEK GUIDE) w/Device KIT 1 each by Does not apply route daily. 08/13/21   Claiborne Rigg, NP  fenofibrate (TRICOR) 48 MG tablet Take 1 tablet (48 mg total) by mouth daily. 09/02/22   Anders Simmonds, PA-C  glucose blood (ACCU-CHEK GUIDE) test strip Use as instructed to test blood sugar once daily 08/13/21   Claiborne Rigg, NP  metFORMIN (GLUCOPHAGE) 500 MG tablet Take 2 tablets (1,000 mg total) by mouth 2 (two) times daily with a meal. = 09/02/22   Anders Simmonds, PA-C  metoprolol tartrate (LOPRESSOR) 25 MG tablet Take 0.5 tablets (12.5 mg total) by mouth 2 (two) times daily. 09/02/22   Anders Simmonds, PA-C  omega-3 acid ethyl esters (LOVAZA) 1 g capsule  Take 2 capsules (2 g total) by mouth 2 (two) times daily. 09/02/22   Anders Simmonds, PA-C  omeprazole (PRILOSEC) 20 MG capsule Take 1 capsule (20 mg total) by mouth daily. Needs appt for more refills. 09/02/22   Anders Simmonds, PA-C  simvastatin (ZOCOR) 40 MG tablet TAKE 1 TABLET BY MOUTH AT BEDTIME 05/06/22   Wendall Stade, MD    ROS: Neg HEENT Neg resp Neg cardiac Neg GI Neg GU Neg psych Neg neuro  Objective:   Vitals:   09/02/22 1034  BP: 138/79  Pulse: (!) 56  SpO2: 97%  Weight: 163 lb 3.2 oz (74 kg)   Exam General appearance : Awake, alert, not in any distress. Speech Clear. Not toxic looking HEENT: Atraumatic and Normocephalic Neck: Supple, no JVD. No cervical lymphadenopathy.  Chest: Good air entry bilaterally, CTAB.  No rales/rhonchi/wheezing CVS: S1 S2 regular,  no murmurs.  Extremities: B/L Lower Ext shows no edema, both legs are warm to touch Neurology: Awake alert, and oriented X 3, CN II-XII intact, Non focal Skin: No Rash  Data Review Lab Results  Component Value Date   HGBA1C 8.4 (A) 09/02/2022   HGBA1C >15.5 (H) 08/13/2021   HGBA1C 7.6 (H) 08/07/2020    Assessment & Plan   1. Type 2 diabetes mellitus with hyperglycemia, unspecified whether long term insulin use (HCC) Encouraged alarm setting so he remebers second dose and increase to 1000mg  bid bc he was only taking 500mg  bid - Glucose (CBG) - HgB A1c - Comprehensive metabolic panel - metFORMIN (GLUCOPHAGE) 500 MG tablet; Take 2 tablets (1,000 mg total) by mouth 2 (two) times daily with a meal. =  Dispense: 360 tablet; Refill: 3  2. Acute idiopathic gout, unspecified site - allopurinol (ZYLOPRIM) 300 MG tablet; Take 1 tablet (300 mg total) by mouth daily.  Dispense: 30 tablet; Refill: 6  3. Coronary artery disease involving native coronary artery of native heart with angina pectoris (HCC) resume - omega-3 acid ethyl esters (LOVAZA) 1 g capsule; Take 2 capsules (2 g total) by mouth 2 (two) times daily.  Dispense: 120 capsule; Refill: 6 - fenofibrate (TRICOR) 48 MG tablet; Take 1 tablet (48 mg total) by mouth daily.  Dispense: 90 tablet; Refill: 3 Make appt with Dr Eden Emms  4. Gastroesophageal reflux disease without esophagitis - omeprazole (PRILOSEC) 20 MG capsule; Take 1 capsule (20 mg total) by mouth daily. Needs appt for more refills.  Dispense: 30 capsule; Refill: 6  5. Dyslipidemia, goal LDL below 70 - Comprehensive metabolic panel - fenofibrate (TRICOR) 48 MG tablet; Take 1 tablet (48 mg total) by mouth daily.  Dispense: 90 tablet; Refill: 3  6. Primary hypertension DASH diet and continue - Comprehensive metabolic panel - metoprolol tartrate (LOPRESSOR) 25 MG tablet; Take 0.5 tablets (12.5 mg total) by mouth 2 (two) times daily.  Dispense: 90 tablet; Refill: 3  7.  Chronic right shoulder pain Was scheduled for surgery and his mom got sick - Ambulatory referral to Orthopedic Surgery    Return in about 4 months (around 01/02/2023) for PCP([facility]) for chronic conditions.  The patient was given clear instructions to go to ER or return to medical center if symptoms don't improve, worsen or new problems develop. The patient verbalized understanding. The patient was told to call to get lab results if they haven't heard anything in the next week.      Georgian Co, PA-C [facility] [place], [place] [phone]   09/02/2022, 12:22 PM

## 2022-09-03 ENCOUNTER — Telehealth: Payer: Self-pay

## 2022-09-03 LAB — COMPREHENSIVE METABOLIC PANEL
ALT: 16 IU/L (ref 0–44)
AST: 17 IU/L (ref 0–40)
Albumin: 4.2 g/dL (ref 3.8–4.9)
Alkaline Phosphatase: 115 IU/L (ref 44–121)
BUN/Creatinine Ratio: 16 (ref 9–20)
BUN: 28 mg/dL — ABNORMAL HIGH (ref 6–24)
Bilirubin Total: <0.2 mg/dL (ref 0.0–1.2)
CO2: 21 mmol/L (ref 20–29)
Calcium: 10.3 mg/dL — ABNORMAL HIGH (ref 8.7–10.2)
Chloride: 100 mmol/L (ref 96–106)
Creatinine, Ser: 1.72 mg/dL — ABNORMAL HIGH (ref 0.76–1.27)
Globulin, Total: 3.5 g/dL (ref 1.5–4.5)
Glucose: 224 mg/dL — ABNORMAL HIGH (ref 70–99)
Potassium: 5 mmol/L (ref 3.5–5.2)
Sodium: 135 mmol/L (ref 134–144)
Total Protein: 7.7 g/dL (ref 6.0–8.5)
eGFR: 45 mL/min/{1.73_m2} — ABNORMAL LOW (ref >59)

## 2022-09-03 NOTE — Telephone Encounter (Signed)
Pt was called and is aware of results, DOB was confirmed.  ?

## 2022-09-03 NOTE — Telephone Encounter (Signed)
-----   Message from Georgian Co sent at 09/03/2022  1:32 PM EDT ----- Please increase your water intake and decrease sugar intake to improve kidney function.  Liver function and electrolytes are normal.  Thanks, Georgian Co, PA-C

## 2022-09-11 ENCOUNTER — Ambulatory Visit (INDEPENDENT_AMBULATORY_CARE_PROVIDER_SITE_OTHER): Payer: Medicaid Other | Admitting: Orthopaedic Surgery

## 2022-09-11 ENCOUNTER — Other Ambulatory Visit (INDEPENDENT_AMBULATORY_CARE_PROVIDER_SITE_OTHER): Payer: Medicaid Other

## 2022-09-11 DIAGNOSIS — G8929 Other chronic pain: Secondary | ICD-10-CM

## 2022-09-11 DIAGNOSIS — M25512 Pain in left shoulder: Secondary | ICD-10-CM

## 2022-09-11 DIAGNOSIS — M25511 Pain in right shoulder: Secondary | ICD-10-CM

## 2022-09-11 NOTE — Progress Notes (Signed)
Office Visit Note   Patient: Willie Walker           Date of Birth: 12/27/1965           MRN: [id number] Visit Date: 09/11/2022              Requested by: Anders Simmonds, PA-C [address] [place],  [place] [address] PCP: Claiborne Rigg, NP   Assessment & Plan: Visit Diagnoses:  1. Acute pain of both shoulders     Plan: Impression is 57 year old gentleman with symptomatic left AC joint arthritis.  He does have a fair amount of arthritic changes on the x-rays.  Treatment options were explained to include oral NSAIDs, cortisone injection, arthroscopic distal clavicle excision.  He will think about his options and let us know.  Follow-Up Instructions: No follow-ups on file.   Orders:  Orders Placed This Encounter  Procedures   XR Shoulder Left   XR Shoulder Right   No orders of the defined types were placed in this encounter.     Procedures: No procedures performed   Clinical Data: No additional findings.   Subjective: Chief Complaint  Patient presents with   Left Shoulder - Pain   Right Shoulder - Pain    HPI Willie Walker is a 57 year old gentleman that I saw a little over 3 years ago for right AC joint arthritis.  He had failed conservative treatments and was scheduled for distal clavicle excision but he was unable to have surgery because he was caring for his mother at the time.  His mother has since passed.  Fortunately his right shoulder is feeling much better.  He has noticed that the left shoulder is feeling reminiscent of the right shoulder.  Denies any injuries.  He reports constant pain around the [facility] joint that is worse with use of the arm especially when he raises it above his head. Review of Systems  Constitutional: Negative.   HENT: Negative.    Eyes: Negative.   Respiratory: Negative.    Cardiovascular: Negative.   Gastrointestinal: Negative.   Endocrine: Negative.   Genitourinary: Negative.   Skin: Negative.   Allergic/Immunologic:  Negative.   Neurological: Negative.   Hematological: Negative.   Psychiatric/Behavioral: Negative.    All other systems reviewed and are negative.    Objective: Vital Signs: There were no vitals taken for this visit.  Physical Exam Vitals and nursing note reviewed.  Constitutional:      Appearance: He is well-developed.  HENT:     Head: Normocephalic and atraumatic.  Eyes:     Pupils: Pupils are equal, round, and reactive to light.  Pulmonary:     Effort: Pulmonary effort is normal.  Abdominal:     Palpations: Abdomen is soft.  Musculoskeletal:        General: Normal range of motion.     Cervical back: Neck supple.  Skin:    General: Skin is warm.  Neurological:     Mental Status: He is alert and oriented to person, place, and time.  Psychiatric:        Behavior: Behavior normal.        Thought Content: Thought content normal.        Judgment: Judgment normal.     Ortho Exam Examination of the left shoulder shows tenderness to the [facility] joint.  Pain with cross body adduction.  Manual muscle testing of the rotator cuff is normal.  He has normal range of motion. Specialty Comments:  No specialty comments available.  Imaging: No results found.   PMFS History: Patient Active Problem List   Diagnosis Date Noted   Blurry vision, bilateral 10/21/2016   Memory loss 10/21/2016   Neuropathic pain of left foot 05/20/2016   Adjustment insomnia 04/22/2016   Post-op pain 02/19/2016   Otitis media 02/19/2016   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   Type 2 diabetes mellitus without complication, without long-term current use of insulin (HCC) 07/25/2015   Renal cyst 07/25/2015   Essential hypertension, benign 11/01/2014   Primary gout 11/01/2014   Polyneuropathy in diabetes(357.2) 07/28/2012   Disc disease, degenerative, lumbar or lumbosacral 07/28/2012   Heel ulcer, right 06/20/2012   Type II or unspecified type diabetes mellitus without mention of  complication, not stated as uncontrolled 06/20/2012   Erectile dysfunction associated with type 2 diabetes mellitus (HCC) 03/10/2012   Pancreatitis 01/08/2012   Coronary artery disease involving native coronary artery of native heart with angina pectoris (HCC)    Unstable angina (HCC) 09/19/2011   Type 2 diabetes mellitus (HCC) 09/09/2011   Mixed hyperlipidemia 09/09/2011   HTN (hypertension) 09/09/2011   ELECTROCARDIOGRAM, ABNORMAL 12/12/2008   Past Medical History:  Diagnosis Date   Allergy    Anginal pain (HCC)    Anxiety    Arthritis    Childhood asthma    Chronic lower back pain    Coronary artery disease    Diabetes mellitus without complication (HCC)    ELECTROCARDIOGRAM, ABNORMAL    GERD (gastroesophageal reflux disease)    Gout    Headache(784.0) 09/18/2011   "maybe twice/wk; pressure on the brain"   High cholesterol    Hypertension    Kidney stones    Lower GI bleed 09/18/2011   "clots and everything; not lately"   Myocardial infarction [facility]) 10/2005   Peripheral vascular disease (HCC)    LLE   Pneumonia 1990's   Seizures (HCC) 09/18/2011   "blanks out on me"/wife's report- pt states never had a seizure- said he would black out from pain yrs ago but never had a seizure -  08-15-2018   Shortness of breath 09/18/2011   "@ rest; lying down; w/exertion"    Family History  Problem Relation Age of Onset   Hypertension Mother    Dementia Mother    CVA Father    Heart attack Maternal Grandmother    Colon cancer Neg Hx    Colon polyps Neg Hx    Esophageal cancer Neg Hx    Rectal cancer Neg Hx    Stomach cancer Neg Hx     Past Surgical History:  Procedure Laterality Date   BURR HOLE OF CRANIUM  1999   "mugged"   CARDIAC CATHETERIZATION N/A 08/01/2015   Procedure: Left Heart Cath and Coronary Angiography;  Surgeon: Peter M Swaziland, MD;  Location: [facility] INVASIVE CV LAB;  Service: Cardiovascular;  Laterality: N/A;   CORONARY ANGIOPLASTY  09/18/2011   CORONARY  ANGIOPLASTY WITH STENT PLACEMENT  10/2005   "9"   CORONARY ARTERY BYPASS GRAFT N/A 08/05/2015   Procedure: CORONARY ARTERY BYPASS GRAFTING (CABG), ON PUMP, TIMES FIVE, USING LEFT INTERNAL MAMMARY ARTERY AND LEFT RADIAL ARTERY, RIGHT GREATER SAPHENOUS VEIN HARVESTED ENDOSCOPICALLY;  Surgeon: Loreli Slot, MD;  Location: [facility] OR;  Service: Open Heart Surgery;  Laterality: N/A;   LACERATION REPAIR  1999   BLE "mugging"   PERCUTANEOUS CORONARY STENT INTERVENTION (PCI-S) N/A 09/18/2011   Procedure: PERCUTANEOUS CORONARY STENT INTERVENTION (PCI-S);  Surgeon: Peter M Swaziland,  MD;  Location: [facility] CATH LAB;  Service: Cardiovascular;  Laterality: N/A;   RADIAL ARTERY HARVEST Left 08/05/2015   Procedure: RADIAL ARTERY HARVEST;  Surgeon: Loreli Slot, MD;  Location: [facility] OR;  Service: Open Heart Surgery;  Laterality: Left;   TEE WITHOUT CARDIOVERSION N/A 08/05/2015   Procedure: TRANSESOPHAGEAL ECHOCARDIOGRAM (TEE);  Surgeon: Loreli Slot, MD;  Location: [facility] OR;  Service: Open Heart Surgery;  Laterality: N/A;   Social History   Occupational History   Not on file  Tobacco Use   Smoking status: Former    Current packs/day: 0.00    Types: Cigarettes    Quit date: 11/03/1982    Years since quitting: 39.8   Smokeless tobacco: Never   Tobacco comments:    advised no smoking marijuana 24 hours prior to surgey   Substance and Sexual Activity   Alcohol use: Yes    Alcohol/week: 4.0 standard drinks of alcohol    Types: 4 Shots of liquor per week    Comment: 08/01/2015  "coulple shots a couple times/week "   Drug use: Yes    Types: Marijuana    Comment: 08/01/2015 "did some coke 07/23/2015; nothing since 2010 before that"   Sexual activity: Not Currently

## 2022-10-22 ENCOUNTER — Other Ambulatory Visit: Payer: Self-pay | Admitting: Nurse Practitioner

## 2022-10-22 DIAGNOSIS — K219 Gastro-esophageal reflux disease without esophagitis: Secondary | ICD-10-CM

## 2022-10-22 NOTE — Telephone Encounter (Signed)
Requested Prescriptions  Refused Prescriptions Disp Refills   omeprazole (PRILOSEC) 20 MG capsule [Pharmacy Med Name: Omeprazole 20 MG Oral Capsule Delayed Release] 30 capsule 0    Sig: TAKE 1 CAPSULE BY MOUTH ONCE DAILY . APPOINTMENT REQUIRED FOR FUTURE REFILLS     Gastroenterology: Proton Pump Inhibitors Passed - 10/22/2022 12:55 PM      Passed - Valid encounter within last 12 months    Recent Outpatient Visits           1 month ago Type 2 diabetes mellitus with hyperglycemia, unspecified whether long term insulin use [facility])   [facility] [place], [place], [place]   1 year ago Encounter to establish care   [facility] [place], Shea Stakes, NP   4 years ago Callus of foot   [facility] Hoy Register, MD   4 years ago Chest pain, unspecified type   [facility] Cain Saupe, MD   4 years ago Acute idiopathic gout, unspecified site   [facility] Hoy Register, MD       Future Appointments             In 2 weeks Claiborne Rigg, NP [facility]   In 2 months Claiborne Rigg, NP [facility]

## 2022-11-09 ENCOUNTER — Encounter: Payer: Self-pay | Admitting: Nurse Practitioner

## 2022-11-09 ENCOUNTER — Ambulatory Visit: Payer: Medicaid Other | Attending: Nurse Practitioner | Admitting: Nurse Practitioner

## 2022-11-09 VITALS — BP 127/88 | HR 84 | Ht 71.0 in | Wt 159.0 lb

## 2022-11-09 DIAGNOSIS — Z7984 Long term (current) use of oral hypoglycemic drugs: Secondary | ICD-10-CM

## 2022-11-09 DIAGNOSIS — E782 Mixed hyperlipidemia: Secondary | ICD-10-CM | POA: Diagnosis not present

## 2022-11-09 DIAGNOSIS — K089 Disorder of teeth and supporting structures, unspecified: Secondary | ICD-10-CM

## 2022-11-09 DIAGNOSIS — G8929 Other chronic pain: Secondary | ICD-10-CM

## 2022-11-09 DIAGNOSIS — R590 Localized enlarged lymph nodes: Secondary | ICD-10-CM | POA: Diagnosis not present

## 2022-11-09 DIAGNOSIS — E1165 Type 2 diabetes mellitus with hyperglycemia: Secondary | ICD-10-CM

## 2022-11-09 DIAGNOSIS — M25512 Pain in left shoulder: Secondary | ICD-10-CM | POA: Insufficient documentation

## 2022-11-09 DIAGNOSIS — R1032 Left lower quadrant pain: Secondary | ICD-10-CM | POA: Insufficient documentation

## 2022-11-09 NOTE — Progress Notes (Signed)
Assessment & Plan:  Willie Walker was seen today for referral.  Diagnoses and all orders for this visit:  Chronic left shoulder pain Follow up with ortho  Type 2 diabetes mellitus with hyperglycemia, unspecified whether long term insulin use (HCC) -     Urine Albumin/Creatinine with ratio (send out) [LAB689] -     Ambulatory referral to Ophthalmology  Poor dentition -     Ambulatory referral to Dentistry  Inguinal adenopathy -     US Pelvis Limited; Future  Mixed hyperlipidemia -     Lipid Panel    Patient has been counseled on age-appropriate routine health concerns for screening and prevention. These are reviewed and up-to-date. Referrals have been placed accordingly. Immunizations are up-to-date or declined.    Subjective:   Chief Complaint  Patient presents with   Referral   Willie Walker presents with complaints of left shoulder pain. Willie Walker also requests refills on Acetaminophen #3 that were not prescribed by his PCP. Patient has been evaluated by orthopedics on 09/11/22. Recommendations were given and patient was to follow up with that office. Explained that Willie Walker needed for follow up with that office.   Willie Walker is concerned about a "knot" near his left groin.   Patient reports Willie Walker is not taking Lovaza or Fenofibrate due to the side effects but Willie Walker can not recall any of the side effects today. Willie Walker states "the other cholesterol medication made me hurt".   Willie Walker would also like a referral to dental office and ophthalmology.     Review of Systems  Constitutional: Negative.  Negative for fever, malaise/fatigue and weight loss.  HENT: Negative.  Negative for nosebleeds.   Eyes: Negative.  Negative for blurred vision, double vision and photophobia.  Respiratory: Negative.  Negative for cough and shortness of breath.   Cardiovascular: Negative.  Negative for chest pain, palpitations and leg swelling.  Gastrointestinal: Negative.  Negative for heartburn, nausea and vomiting.   Genitourinary:        "Knot in near stomach" since April 2024  Musculoskeletal:  Positive for joint pain. Negative for myalgias.       Left shoulder pain  Neurological: Negative.  Negative for dizziness, focal weakness, seizures and headaches.  Psychiatric/Behavioral: Negative.  Negative for suicidal ideas.     Past Medical History:  Diagnosis Date   Allergy    Anginal pain (HCC)    Anxiety    Arthritis    Childhood asthma    Chronic lower back pain    Coronary artery disease    Diabetes mellitus without complication (HCC)    ELECTROCARDIOGRAM, ABNORMAL    GERD (gastroesophageal reflux disease)    Gout    Headache(784.0) 09/18/2011   "maybe twice/wk; pressure on the brain"   High cholesterol    Hypertension    Kidney stones    Lower GI bleed 09/18/2011   "clots and everything; not lately"   Myocardial infarction [facility]) 10/2005   Peripheral vascular disease (HCC)    LLE   Pneumonia 1990's   Seizures (HCC) 09/18/2011   "blanks out on me"/wife's report- pt states never had a seizure- said Willie Walker would black out from pain yrs ago but never had a seizure -  08-15-2018   Shortness of breath 09/18/2011   "@ rest; lying down; w/exertion"    Past Surgical History:  Procedure Laterality Date   BURR HOLE OF CRANIUM  1999   "mugged"   CARDIAC CATHETERIZATION N/A 08/01/2015   Procedure: Left Heart Cath and Coronary  Angiography;  Surgeon: Peter M Swaziland, MD;  Location: [facility] INVASIVE CV LAB;  Service: Cardiovascular;  Laterality: N/A;   CORONARY ANGIOPLASTY  09/18/2011   CORONARY ANGIOPLASTY WITH STENT PLACEMENT  10/2005   "9"   CORONARY ARTERY BYPASS GRAFT N/A 08/05/2015   Procedure: CORONARY ARTERY BYPASS GRAFTING (CABG), ON PUMP, TIMES FIVE, USING LEFT INTERNAL MAMMARY ARTERY AND LEFT RADIAL ARTERY, RIGHT GREATER SAPHENOUS VEIN HARVESTED ENDOSCOPICALLY;  Surgeon: Loreli Slot, MD;  [facility] OR;  Service: Open Heart Surgery;  Laterality: N/A;   LACERATION REPAIR  1999   BLE  "mugging"   PERCUTANEOUS CORONARY STENT INTERVENTION (PCI-S) N/A 09/18/2011   Procedure: PERCUTANEOUS CORONARY STENT INTERVENTION (PCI-S);  Surgeon: Peter M Swaziland, MD;  Location: [facility] CATH LAB;  Service: Cardiovascular;  Laterality: N/A;   RADIAL ARTERY HARVEST Left 08/05/2015   Procedure: RADIAL ARTERY HARVEST;  Surgeon: Loreli Slot, MD;  Location: [facility] OR;  Service: Open Heart Surgery;  Laterality: Left;   TEE WITHOUT CARDIOVERSION N/A 08/05/2015   Procedure: TRANSESOPHAGEAL ECHOCARDIOGRAM (TEE);  Surgeon: Loreli Slot, MD;  Location: [facility] OR;  Service: Open Heart Surgery;  Laterality: N/A;    Family History  Problem Relation Age of Onset   Hypertension Mother    Dementia Mother    CVA Father    Heart attack Maternal Grandmother    Colon cancer Neg Hx    Colon polyps Neg Hx    Esophageal cancer Neg Hx    Rectal cancer Neg Hx    Stomach cancer Neg Hx     Social History Reviewed with no changes to be made today.   Outpatient Medications Prior to Visit  Medication Sig Dispense Refill   allopurinol (ZYLOPRIM) 300 MG tablet Take 1 tablet (300 mg total) by mouth daily. 30 tablet 6   metFORMIN (GLUCOPHAGE) 500 MG tablet Take 2 tablets (1,000 mg total) by mouth 2 (two) times daily with a meal. = 360 tablet 3   omeprazole (PRILOSEC) 20 MG capsule Take 1 capsule (20 mg total) by mouth daily. Needs appt for more refills. 30 capsule 6   Accu-Chek FastClix Lancets MISC Use as directed to test blood sugar once daily (Patient not taking: Reported on 11/09/2022) 102 each 12   Blood Glucose Monitoring Suppl (ACCU-CHEK GUIDE) w/Device KIT 1 each by Does not apply route daily. (Patient not taking: Reported on 11/09/2022) 1 kit 0   glucose blood (ACCU-CHEK GUIDE) test strip Use as instructed to test blood sugar once daily (Patient not taking: Reported on 11/09/2022) 100 each 12   metoprolol tartrate (LOPRESSOR) 25 MG tablet Take 0.5 tablets (12.5 mg total) by mouth 2 (two) times daily.  (Patient not taking: Reported on 11/09/2022) 90 tablet 3   simvastatin (ZOCOR) 40 MG tablet TAKE 1 TABLET BY MOUTH AT BEDTIME (Patient not taking: Reported on 11/09/2022) 30 tablet 0   acetaminophen-codeine (TYLENOL #3) 300-30 MG tablet Take 1 tablet by mouth every 6-8 hours as needed for pain (Patient not taking: Reported on 11/09/2022) 12 tablet 0   fenofibrate (TRICOR) 48 MG tablet Take 1 tablet (48 mg total) by mouth daily. (Patient not taking: Reported on 11/09/2022) 90 tablet 3   omega-3 acid ethyl esters (LOVAZA) 1 g capsule Take 2 capsules (2 g total) by mouth 2 (two) times daily. (Patient not taking: Reported on 11/09/2022) 120 capsule 6   No facility-administered medications prior to visit.    Allergies  Allergen Reactions   Crestor [Rosuvastatin] Other (See Comments)    "Makes  my legs hurt" - tolerates simvastatin        Objective:    BP 127/88 (BP Location: Left Arm, Patient Position: Sitting, Cuff Size: Normal)   Pulse 84   Ht 5\' 11"  (1.803 m)   Wt 159 lb (72.1 kg)   SpO2 99%   BMI 22.18 kg/m  Wt Readings from Last 3 Encounters:  11/09/22 159 lb (72.1 kg)  09/02/22 163 lb 3.2 oz (74 kg)  08/13/21 160 lb 3.2 oz (72.7 kg)    Physical Exam Vitals and nursing note reviewed.  Constitutional:      Appearance: Willie Walker is well-developed.  HENT:     Head: Normocephalic and atraumatic.     Mouth/Throat:     Dentition: Abnormal dentition.     Comments: Poor dentition; plaque and gingivitis noted Cardiovascular:     Rate and Rhythm: Normal rate and regular rhythm.     Pulses: Normal pulses.     Heart sounds: Normal heart sounds. No murmur heard.    No friction rub. No gallop.  Pulmonary:     Effort: Pulmonary effort is normal. No tachypnea or respiratory distress.     Breath sounds: Normal breath sounds. No decreased breath sounds, wheezing, rhonchi or rales.  Chest:     Chest wall: No tenderness.  Abdominal:     General: Abdomen is flat. Bowel sounds are normal.      Palpations: Abdomen is soft.     Tenderness: There is abdominal tenderness in the suprapubic area and left upper quadrant. There is guarding.     Comments: Marble-sized firmness in left groin region  Musculoskeletal:        General: Normal range of motion.     Cervical back: Normal range of motion.  Skin:    General: Skin is warm and dry.  Neurological:     Mental Status: Willie Walker is alert and oriented to person, place, and time.     Coordination: Coordination normal.  Psychiatric:        Behavior: Behavior normal. Behavior is cooperative.        Thought Content: Thought content normal.        Judgment: Judgment normal.          Patient has been counseled extensively about nutrition and exercise as well as the importance of adherence with medications and regular follow-up. The patient was given clear instructions to go to ER or return to medical center if symptoms don't improve, worsen or new problems develop. The patient verbalized understanding.   Follow-up: Return in about 8 weeks (around 01/04/2023), or if symptoms worsen or fail to improve.   I have seen and examined this patient with the advanced practice provider student Burnard Bunting and agree with the above note .  Claiborne Rigg, FNP-BC [facility] [place], [place] [phone]   11/09/2022, 8:29 PM

## 2022-11-09 NOTE — Assessment & Plan Note (Signed)
Patient reports he is not taking Lovaza or Fenofibrate due to the side effects. He was not able to verbalize what the side effects were. He says the other "other cholesterol medication made me hurt". Will obtain lipid panel.

## 2022-11-10 LAB — LIPID PANEL
Chol/HDL Ratio: 5.2 ratio — ABNORMAL HIGH (ref 0.0–5.0)
Cholesterol, Total: 298 mg/dL — ABNORMAL HIGH (ref 100–199)
HDL: 57 mg/dL (ref >39)
LDL Chol Calc (NIH): 169 mg/dL — ABNORMAL HIGH (ref 0–99)
Triglycerides: 375 mg/dL — ABNORMAL HIGH (ref 0–149)
VLDL Cholesterol Cal: 72 mg/dL — ABNORMAL HIGH (ref 5–40)

## 2022-11-10 LAB — MICROALBUMIN / CREATININE URINE RATIO
Creatinine, Urine: 105.6 mg/dL
Microalb/Creat Ratio: 420 mg/g{creat} — ABNORMAL HIGH (ref 0–29)
Microalbumin, Urine: 444 ug/mL

## 2022-11-12 ENCOUNTER — Other Ambulatory Visit: Payer: Medicaid Other

## 2022-11-15 ENCOUNTER — Other Ambulatory Visit: Payer: Self-pay | Admitting: Nurse Practitioner

## 2022-11-15 DIAGNOSIS — R809 Proteinuria, unspecified: Secondary | ICD-10-CM

## 2022-11-15 MED ORDER — LISINOPRIL 5 MG PO TABS
5.0000 mg | ORAL_TABLET | Freq: Every day | ORAL | 3 refills | Status: DC
Start: 2022-11-15 — End: 2023-04-15

## 2022-11-16 ENCOUNTER — Ambulatory Visit
Admission: RE | Admit: 2022-11-16 | Discharge: 2022-11-16 | Disposition: A | Discharge status: Home / Self Care | Payer: Medicaid Other | Source: Ambulatory Visit | Attending: Nurse Practitioner | Admitting: Nurse Practitioner

## 2022-11-16 DIAGNOSIS — R1909 Other intra-abdominal and pelvic swelling, mass and lump: Secondary | ICD-10-CM | POA: Diagnosis not present

## 2022-11-16 DIAGNOSIS — R590 Localized enlarged lymph nodes: Secondary | ICD-10-CM

## 2022-12-01 NOTE — Progress Notes (Deleted)
Cardiology Office Note    Patient Name: Willie Walker Date of Encounter: 12/01/2022  Primary Care Provider:  Claiborne Rigg, NP Primary Cardiologist:  Charlton Haws, MD Primary Electrophysiologist: None   Past Medical History    Past Medical History:  Diagnosis Date   Allergy    Anginal pain (HCC)    Anxiety    Arthritis    Childhood asthma    Chronic lower back pain    Coronary artery disease    Diabetes mellitus without complication (HCC)    ELECTROCARDIOGRAM, ABNORMAL    GERD (gastroesophageal reflux disease)    Gout    Headache(784.0) 09/18/2011   "maybe twice/wk; pressure on the brain"   High cholesterol    Hypertension    Kidney stones    Lower GI bleed 09/18/2011   "clots and everything; not lately"   Myocardial infarction [facility]) 10/2005   Peripheral vascular disease (HCC)    LLE   Pneumonia 1990's   Seizures (HCC) 09/18/2011   "blanks out on me"/wife's report- pt states never had a seizure- said he would black out from pain yrs ago but never had a seizure -  08-15-2018   Shortness of breath 09/18/2011   "@ rest; lying down; w/exertion"    History of Present Illness  Willie Walker is a 57 y.o. male with a PMH of CAD s/p multiple PCI's and CABG x 5 07/2015, HTN, DM type II HLD, PVD, polysubstance abuse   Mr. Freidel was seen initially by Dr. Eden Emms in 2010 for complaint of chest pain.  He has several stents placed in [place] in 2007 and relook cath completed 2013 showing ISR mid RCA and mid circumflex plasty. He underwent CABG x5V utilizing LIMA to LAD, SVG to Diagonal, Sequential SVG to Ramus Intermediate and OM, and Radial artery to the PDA on 08/05/15 with Dr. Dorris Fetch.  Postprocedure complicated by pericarditis and volume overload treated with diuretics and colchicine.  He was last seen by Dr. Eden Emms on 12/21/2019 with no cardiac complaints only right shoulder pain.  He was referred to the lipid clinic due to myalgias and is currently on Zocor.  He was  approved to start Nexlizet and Repatha but never started.    During today's visit the patient reports*** .  Patient denies chest pain, palpitations, dyspnea, PND, orthopnea, nausea, vomiting, dizziness, syncope, edema, weight gain, or early satiety.  ***Notes: -Last ischemic evaluation: -Last echo: -Interim ED visits: Review of Systems  Please see the history of present illness.    All other systems reviewed and are otherwise negative except as noted above.  Physical Exam    Wt Readings from Last 3 Encounters:  11/09/22 159 lb (72.1 kg)  09/02/22 163 lb 3.2 oz (74 kg)  08/13/21 160 lb 3.2 oz (72.7 kg)   EX:BMWUX were no vitals filed for this visit.,There is no height or weight on file to calculate BMI. GEN: Well nourished, well developed in no acute distress Neck: No JVD; No carotid bruits Pulmonary: Clear to auscultation without rales, wheezing or rhonchi  Cardiovascular: Normal rate. Regular rhythm. Normal S1. Normal S2.   Murmurs: There is no murmur.  ABDOMEN: Soft, non-tender, non-distended EXTREMITIES:  No edema; No deformity   EKG/LABS/ Recent Cardiac Studies   ECG personally reviewed by me today - ***  Risk Assessment/Calculations:   {Does this patient have ATRIAL FIBRILLATION?:[id number]}      Lab Results  Component Value Date   WBC 6.1 08/07/2020   HGB 13.4 08/07/2020  HCT 39.0 08/07/2020   MCV 89 08/07/2020   PLT 230 08/07/2020   Lab Results  Component Value Date   CREATININE 1.72 (H) 09/02/2022   BUN 28 (H) 09/02/2022   NA 135 09/02/2022   K 5.0 09/02/2022   CL 100 09/02/2022   CO2 21 09/02/2022   Lab Results  Component Value Date   CHOL 298 (H) 11/09/2022   HDL 57 11/09/2022   LDLCALC 169 (H) 11/09/2022   LDLDIRECT 123 (H) 04/02/2020   TRIG 375 (H) 11/09/2022   CHOLHDL 5.2 (H) 11/09/2022    Lab Results  Component Value Date   HGBA1C 8.4 (A) 09/02/2022   Assessment & Plan    1.  Coronary artery disease: -s/p multiple PCI's and CABG x 5  in 2017 -Today patient reports***  2.  Essential hypertension: -Patient's blood pressure today was***  3.  Hyperlipidemia: -Previously documented myalgias and is currently on***  4.  DM type II: -Patient's last hemoglobin A1c was***  5.  History of polysubstance abuse:      Disposition: Follow-up with Charlton Haws, MD or APP in *** months {Are you ordering a CV Procedure (e.g. stress test, cath, DCCV, TEE, etc)?   Press F2        :[id number]}   Signed, Napoleon Form, Leodis Rains, NP 12/01/2022, 10:11 AM  [facility]

## 2022-12-02 ENCOUNTER — Ambulatory Visit: Payer: Medicaid Other | Admitting: Nurse Practitioner

## 2022-12-02 DIAGNOSIS — I2510 Atherosclerotic heart disease of native coronary artery without angina pectoris: Secondary | ICD-10-CM

## 2022-12-02 DIAGNOSIS — I10 Essential (primary) hypertension: Secondary | ICD-10-CM

## 2022-12-02 DIAGNOSIS — F1910 Other psychoactive substance abuse, uncomplicated: Secondary | ICD-10-CM

## 2022-12-02 DIAGNOSIS — E785 Hyperlipidemia, unspecified: Secondary | ICD-10-CM

## 2022-12-02 DIAGNOSIS — E1169 Type 2 diabetes mellitus with other specified complication: Secondary | ICD-10-CM

## 2023-01-04 ENCOUNTER — Encounter: Payer: Self-pay | Admitting: Nurse Practitioner

## 2023-01-04 ENCOUNTER — Ambulatory Visit: Payer: Medicaid Other | Attending: Nurse Practitioner | Admitting: Nurse Practitioner

## 2023-01-04 VITALS — BP 144/85 | HR 86 | Temp 98.0°F | Resp 20 | Ht 71.0 in | Wt 156.2 lb

## 2023-01-04 DIAGNOSIS — E119 Type 2 diabetes mellitus without complications: Secondary | ICD-10-CM

## 2023-01-04 DIAGNOSIS — Z7984 Long term (current) use of oral hypoglycemic drugs: Secondary | ICD-10-CM | POA: Diagnosis not present

## 2023-01-04 DIAGNOSIS — K089 Disorder of teeth and supporting structures, unspecified: Secondary | ICD-10-CM

## 2023-01-04 DIAGNOSIS — D649 Anemia, unspecified: Secondary | ICD-10-CM | POA: Diagnosis not present

## 2023-01-04 DIAGNOSIS — E1165 Type 2 diabetes mellitus with hyperglycemia: Secondary | ICD-10-CM | POA: Diagnosis not present

## 2023-01-04 DIAGNOSIS — I10 Essential (primary) hypertension: Secondary | ICD-10-CM | POA: Diagnosis not present

## 2023-01-04 DIAGNOSIS — Z1211 Encounter for screening for malignant neoplasm of colon: Secondary | ICD-10-CM

## 2023-01-04 LAB — POCT GLYCOSYLATED HEMOGLOBIN (HGB A1C): Hemoglobin A1C: 7.8 % — AB (ref 4.0–5.6)

## 2023-01-04 MED ORDER — ACCU-CHEK SOFTCLIX LANCETS MISC: 6 refills | Status: DC

## 2023-01-04 MED ORDER — ACCU-CHEK GUIDE W/DEVICE KIT: 1.0000 | PACK | Freq: Every day | 0 refills | Status: AC

## 2023-01-04 MED ORDER — GLUCOSE BLOOD VI STRP: ORAL_STRIP | 12 refills | Status: DC

## 2023-01-04 NOTE — Progress Notes (Signed)
Assessment & Plan:  Willie Walker was seen today for medical management of chronic issues.  Diagnoses and all orders for this visit:  Diabetes mellitus treated with oral medication Increase metformin to 2 tablets twice per day instead of 1 tablet twice per day -     POCT glycosylated hemoglobin (Hb A1C) -     Blood Glucose Monitoring Suppl (ACCU-CHEK GUIDE) w/Device KIT; 1 each by Does not apply route daily. Use as instructed. Check blood glucose level by fingerstick 2-3 times per day. -     Accu-Chek Softclix Lancets lancets; Use as instructed. Check blood glucose level by fingerstick 2-3 times  per day. -     glucose blood test strip; Use as instructed. Check blood glucose level by fingerstick 2-3 times per day. -     Basic metabolic panel -     Ambulatory referral to Ophthalmology Continue blood sugar control as discussed in office today, low carbohydrate diet, and regular physical exercise as tolerated, 150 minutes per week (30 min each day, 5 days per week, or 50 min 3 days per week). Keep blood sugar logs with fasting goal of 90-130 mg/dl, post prandial (after you eat) less than 180.  For Hypoglycemia: BS <60 and Hyperglycemia BS >400; contact the clinic ASAP. Annual eye exams and foot exams are recommended.   Primary hypertension Make sure you are taking lisinopril and metoprolol as prescribed daily. Reminded to bring in blood pressure log for follow  up appointment.  RECOMMENDATIONS: DASH/Mediterranean Diets are healthier choices for HTN.    Anemia, unspecified type -     CBC with Differential  Colon cancer screening -     Ambulatory referral to Gastroenterology  Poor dentition -     Ambulatory referral to Dentistry    Patient has been counseled on age-appropriate routine health concerns for screening and prevention. These are reviewed and up-to-date. Referrals have been placed accordingly. Immunizations are up-to-date or declined.    Subjective:   Chief Complaint  Patient  presents with   Medical Management of Chronic Issues    Willie Walker 57 y.o. male presents to office today for follow-up to diabetes.  He has a past medical history of Allergy, Anginal pain, Anxiety, Arthritis, Childhood asthma, Chronic lower back pain, Coronary artery disease, DM2, ELECTROCARDIOGRAM, ABNORMAL, GERD, Gout, Headache(784.0) (09/18/2011), High cholesterol, Hypertension, Kidney stones, Lower GI bleed (09/18/2011), Myocardial infarction  (10/2005), Peripheral vascular disease, Pneumonia (1990's), Seizures (09/18/2011)  DM 2 Diabetes is not well-controlled.  He is currently taking metformin 500 mg twice daily and has been instructed today to increase to 1000 mg twice daily or 2 tablets twice a day. Lab Results  Component Value Date   HGBA1C 7.8 (A) 01/04/2023     HTN Blood pressure is not at goal of less than 130/80.  He is currently prescribed Lopressor 12.5 mg twice daily and lisinopril 5 mg daily.  It does not appear he has been taking the lisinopril 5 g daily as it has not been picked up from the pharmacy as of yet. BP Readings from Last 3 Encounters:  01/04/23 (!) 144/85  11/09/22 127/88  09/02/22 138/79        Review of Systems  Constitutional:  Negative for fever, malaise/fatigue and weight loss.  HENT: Negative.  Negative for nosebleeds.   Eyes: Negative.  Negative for blurred vision, double vision and photophobia.  Respiratory: Negative.  Negative for cough and shortness of breath.   Cardiovascular: Negative.  Negative for chest pain, palpitations  and leg swelling.  Gastrointestinal: Negative.  Negative for heartburn, nausea and vomiting.  Musculoskeletal: Negative.  Negative for myalgias.  Neurological: Negative.  Negative for dizziness, focal weakness, seizures and headaches.  Psychiatric/Behavioral: Negative.  Negative for suicidal ideas.     Past Medical History:  Diagnosis Date   Allergy    Anginal pain (HCC)    Anxiety    Arthritis    Childhood  asthma    Chronic lower back pain    Coronary artery disease    Diabetes mellitus without complication (HCC)    ELECTROCARDIOGRAM, ABNORMAL    GERD (gastroesophageal reflux disease)    Gout    Headache(784.0) 09/18/2011   "maybe twice/wk; pressure on the brain"   High cholesterol    Hypertension    Kidney stones    Lower GI bleed 09/18/2011   "clots and everything; not lately"   Myocardial infarction [facility]) 10/2005   Peripheral vascular disease (HCC)    LLE   Pneumonia 1990's   Seizures (HCC) 09/18/2011   "blanks out on me"/wife's report- pt states never had a seizure- said he would black out from pain yrs ago but never had a seizure -  08-15-2018   Shortness of breath 09/18/2011   "@ rest; lying down; w/exertion"    Past Surgical History:  Procedure Laterality Date   BURR HOLE OF CRANIUM  1999   "mugged"   CARDIAC CATHETERIZATION N/A 08/01/2015   Procedure: Left Heart Cath and Coronary Angiography;  Surgeon: Peter M Swaziland, MD;  Location: [facility] INVASIVE CV LAB;  Service: Cardiovascular;  Laterality: N/A;   CORONARY ANGIOPLASTY  09/18/2011   CORONARY ANGIOPLASTY WITH STENT PLACEMENT  10/2005   "9"   CORONARY ARTERY BYPASS GRAFT N/A 08/05/2015   Procedure: CORONARY ARTERY BYPASS GRAFTING (CABG), ON PUMP, TIMES FIVE, USING LEFT INTERNAL MAMMARY ARTERY AND LEFT RADIAL ARTERY, RIGHT GREATER SAPHENOUS VEIN HARVESTED ENDOSCOPICALLY;  Surgeon: Loreli Slot, MD;  Location: [facility] OR;  Service: Open Heart Surgery;  Laterality: N/A;   LACERATION REPAIR  1999   BLE "mugging"   PERCUTANEOUS CORONARY STENT INTERVENTION (PCI-S) N/A 09/18/2011   Procedure: PERCUTANEOUS CORONARY STENT INTERVENTION (PCI-S);  Surgeon: Peter M Swaziland, MD;  Location: [facility] CATH LAB;  Service: Cardiovascular;  Laterality: N/A;   RADIAL ARTERY HARVEST Left 08/05/2015   Procedure: RADIAL ARTERY HARVEST;  Surgeon: Loreli Slot, MD;  Location: [facility] OR;  Service: Open Heart Surgery;  Laterality: Left;   TEE WITHOUT  CARDIOVERSION N/A 08/05/2015   Procedure: TRANSESOPHAGEAL ECHOCARDIOGRAM (TEE);  Surgeon: Loreli Slot, MD;  Location: [facility] OR;  Service: Open Heart Surgery;  Laterality: N/A;    Family History  Problem Relation Age of Onset   Hypertension Mother    Dementia Mother    CVA Father    Heart attack Maternal Grandmother    Colon cancer Neg Hx    Colon polyps Neg Hx    Esophageal cancer Neg Hx    Rectal cancer Neg Hx    Stomach cancer Neg Hx     Social History Reviewed with no changes to be made today.   Outpatient Medications Prior to Visit  Medication Sig Dispense Refill   allopurinol (ZYLOPRIM) 300 MG tablet Take 1 tablet (300 mg total) by mouth daily. 30 tablet 6   lisinopril (ZESTRIL) 5 MG tablet Take 1 tablet (5 mg total) by mouth daily. 90 tablet 3   metFORMIN (GLUCOPHAGE) 500 MG tablet Take 2 tablets (1,000 mg total) by mouth 2 (two) times daily  with a meal. = 360 tablet 3   metoprolol tartrate (LOPRESSOR) 25 MG tablet Take 0.5 tablets (12.5 mg total) by mouth 2 (two) times daily. 90 tablet 3   omeprazole (PRILOSEC) 20 MG capsule Take 1 capsule (20 mg total) by mouth daily. Needs appt for more refills. 30 capsule 6   simvastatin (ZOCOR) 40 MG tablet TAKE 1 TABLET BY MOUTH AT BEDTIME 30 tablet 0   Accu-Chek FastClix Lancets MISC Use as directed to test blood sugar once daily (Patient not taking: Reported on 01/04/2023) 102 each 12   glucose blood (ACCU-CHEK GUIDE) test strip Use as instructed to test blood sugar once daily (Patient not taking: Reported on 01/04/2023) 100 each 12   Blood Glucose Monitoring Suppl (ACCU-CHEK GUIDE) w/Device KIT 1 each by Does not apply route daily. (Patient not taking: Reported on 01/04/2023) 1 kit 0   No facility-administered medications prior to visit.    Allergies  Allergen Reactions   Crestor [Rosuvastatin] Other (See Comments)    "Makes my legs hurt" - tolerates simvastatin        Objective:    BP (!) 144/85   Pulse 86   Temp 98  F (36.7 C) (Oral)   Resp 20   Ht 5\' 11"  (1.803 m)   Wt 156 lb 3.2 oz (70.9 kg)   SpO2 98%   BMI 21.79 kg/m  Wt Readings from Last 3 Encounters:  01/04/23 156 lb 3.2 oz (70.9 kg)  11/09/22 159 lb (72.1 kg)  09/02/22 163 lb 3.2 oz (74 kg)    Physical Exam Vitals and nursing note reviewed.  Constitutional:      Appearance: He is well-developed.  HENT:     Head: Normocephalic and atraumatic.  Cardiovascular:     Rate and Rhythm: Normal rate and regular rhythm.     Heart sounds: Normal heart sounds. No murmur heard.    No friction rub. No gallop.  Pulmonary:     Effort: Pulmonary effort is normal. No tachypnea or respiratory distress.     Breath sounds: Normal breath sounds. No decreased breath sounds, wheezing, rhonchi or rales.  Chest:     Chest wall: No tenderness.  Abdominal:     General: Bowel sounds are normal.     Palpations: Abdomen is soft.  Musculoskeletal:        General: Normal range of motion.     Cervical back: Normal range of motion.  Skin:    General: Skin is warm and dry.  Neurological:     Mental Status: He is alert and oriented to person, place, and time.     Coordination: Coordination normal.  Psychiatric:        Behavior: Behavior normal. Behavior is cooperative.        Thought Content: Thought content normal.        Judgment: Judgment normal.          Patient has been counseled extensively about nutrition and exercise as well as the importance of adherence with medications and regular follow-up. The patient was given clear instructions to go to ER or return to medical center if symptoms don't improve, worsen or new problems develop. The patient verbalized understanding.   Follow-up: Return in about 3 months (around 04/04/2023).   Claiborne Rigg, FNP-BC [facility] [place], [place] [phone]   01/04/2023, 1:33 PM

## 2023-01-05 ENCOUNTER — Other Ambulatory Visit: Payer: Self-pay | Admitting: Nurse Practitioner

## 2023-01-05 DIAGNOSIS — N1832 Chronic kidney disease, stage 3b: Secondary | ICD-10-CM

## 2023-01-05 LAB — CBC WITH DIFFERENTIAL/PLATELET
Basophils Absolute: 0 10*3/uL (ref 0.0–0.2)
Basos: 1 %
EOS (ABSOLUTE): 0.1 10*3/uL (ref 0.0–0.4)
Eos: 2 %
Hematocrit: 44.5 % (ref 37.5–51.0)
Hemoglobin: 14.8 g/dL (ref 13.0–17.7)
Immature Grans (Abs): 0 10*3/uL (ref 0.0–0.1)
Immature Granulocytes: 0 %
Lymphocytes Absolute: 2 10*3/uL (ref 0.7–3.1)
Lymphs: 40 %
MCH: 29.3 pg (ref 26.6–33.0)
MCHC: 33.3 g/dL (ref 31.5–35.7)
MCV: 88 fL (ref 79–97)
Monocytes Absolute: 0.4 10*3/uL (ref 0.1–0.9)
Monocytes: 9 %
Neutrophils Absolute: 2.4 10*3/uL (ref 1.4–7.0)
Neutrophils: 48 %
Platelets: 280 10*3/uL (ref 150–450)
RBC: 5.05 x10E6/uL (ref 4.14–5.80)
RDW: 12.4 % (ref 11.6–15.4)
WBC: 5 10*3/uL (ref 3.4–10.8)

## 2023-01-05 LAB — BASIC METABOLIC PANEL
BUN/Creatinine Ratio: 19 (ref 9–20)
BUN: 34 mg/dL — ABNORMAL HIGH (ref 6–24)
CO2: 22 mmol/L (ref 20–29)
Calcium: 10.5 mg/dL — ABNORMAL HIGH (ref 8.7–10.2)
Chloride: 103 mmol/L (ref 96–106)
Creatinine, Ser: 1.76 mg/dL — ABNORMAL HIGH (ref 0.76–1.27)
Glucose: 254 mg/dL — ABNORMAL HIGH (ref 70–99)
Potassium: 5.6 mmol/L — ABNORMAL HIGH (ref 3.5–5.2)
Sodium: 142 mmol/L (ref 134–144)
eGFR: 44 mL/min/{1.73_m2} — ABNORMAL LOW (ref >59)

## 2023-02-03 NOTE — Progress Notes (Deleted)
Cardiology Office Note    Patient Name: Willie Walker Date of Encounter: 02/03/2023  Primary Care Provider:  Claiborne Rigg, NP Primary Cardiologist:  Willie Haws, MD Primary Electrophysiologist: None   Past Medical History    Past Medical History:  Diagnosis Date   Allergy    Anginal pain (HCC)    Anxiety    Arthritis    Childhood asthma    Chronic lower back pain    Coronary artery disease    Diabetes mellitus without complication (HCC)    ELECTROCARDIOGRAM, ABNORMAL    GERD (gastroesophageal reflux disease)    Gout    Headache(784.0) 09/18/2011   "maybe twice/wk; pressure on the brain"   High cholesterol    Hypertension    Kidney stones    Lower GI bleed 09/18/2011   "clots and everything; not lately"   Myocardial infarction [facility]) 10/2005   Peripheral vascular disease (HCC)    LLE   Pneumonia 1990's   Seizures (HCC) 09/18/2011   "blanks out on me"/wife's report- pt states never had a seizure- said he would black out from pain yrs ago but never had a seizure -  08-15-2018   Shortness of breath 09/18/2011   "@ rest; lying down; w/exertion"    History of Present Illness  Willie Walker is a 58 y.o. male with a PMH of CAD s/p multiple PCI's and CABG x 5 07/2015, HTN, DM type II HLD, PVD, polysubstance abuse who presents today for 1 year follow-up.   Willie Walker was seen initially by Dr. Eden Walker in 2010 for complaint of chest pain.  He has several stents placed in [place] in 2007 and relook cath completed 2013 showing ISR mid RCA and mid circumflex plasty. He underwent CABG x5V utilizing LIMA to LAD, SVG to Diagonal, Sequential SVG to Ramus Intermediate and OM, and Radial artery to the PDA on 08/05/15 with Dr. Dorris Walker.  Postprocedure complicated by pericarditis and volume overload treated with diuretics and colchicine.  He was last seen by Dr. Eden Walker on 12/21/2019 with no cardiac complaints only right shoulder pain.  He was referred to the lipid clinic due to myalgias  and is currently on Zocor.  He was approved to start Nexlizet and Repatha but never started.    During today's visit the patient reports*** .  Patient denies chest pain, palpitations, dyspnea, PND, orthopnea, nausea, vomiting, dizziness, syncope, edema, weight gain, or early satiety.  ***Notes: -Last ischemic evaluation: -Last echo: -Interim ED visits: Review of Systems  Please see the history of present illness.    All other systems reviewed and are otherwise negative except as noted above.  Physical Exam    Wt Readings from Last 3 Encounters:  01/04/23 156 lb 3.2 oz (70.9 kg)  11/09/22 159 lb (72.1 kg)  09/02/22 163 lb 3.2 oz (74 kg)   [id number] were no vitals filed for this visit.,There is no height or weight on file to calculate BMI. GEN: Well nourished, well developed in no acute distress Neck: No JVD; No carotid bruits Pulmonary: Clear to auscultation without rales, wheezing or rhonchi  Cardiovascular: Normal rate. Regular rhythm. Normal S1. Normal S2.   Murmurs: There is no murmur.  ABDOMEN: Soft, non-tender, non-distended EXTREMITIES:  No edema; No deformity   EKG/LABS/ Recent Cardiac Studies   ECG personally reviewed by me today - ***  Risk Assessment/Calculations:   {Does this patient have ATRIAL FIBRILLATION?:[id number]}      Lab Results  Component Value Date   WBC 5.0  01/04/2023   HGB 14.8 01/04/2023   HCT 44.5 01/04/2023   MCV 88 01/04/2023   PLT 280 01/04/2023   Lab Results  Component Value Date   CREATININE 1.76 (H) 01/04/2023   BUN 34 (H) 01/04/2023   NA 142 01/04/2023   K 5.6 (H) 01/04/2023   CL 103 01/04/2023   CO2 22 01/04/2023   Lab Results  Component Value Date   CHOL 298 (H) 11/09/2022   HDL 57 11/09/2022   LDLCALC 169 (H) 11/09/2022   LDLDIRECT 123 (H) 04/02/2020   TRIG 375 (H) 11/09/2022   CHOLHDL 5.2 (H) 11/09/2022    Lab Results  Component Value Date   HGBA1C 7.8 (A) 01/04/2023   Assessment & Plan    1.  Coronary artery  disease: -s/p multiple PCI's and CABG x 5 in 2017 -Today patient reports***   2.  Essential hypertension: -Patient's blood pressure today was***   3.  Hyperlipidemia: -Previously documented myalgias and is currently on***   4.  DM type II: -Patient's last hemoglobin A1c was***   5.  History of polysubstance abuse:      Disposition: Follow-up with Willie Haws, MD or APP in *** months {Are you ordering a CV Procedure (e.g. stress test, cath, DCCV, TEE, etc)?   Press F2        :[id number]}   Signed, Willie Walker, Leodis Rains, NP 02/03/2023, 10:39 AM [facility]

## 2023-02-04 ENCOUNTER — Ambulatory Visit: Payer: Medicaid Other | Admitting: Nurse Practitioner

## 2023-02-04 DIAGNOSIS — I10 Essential (primary) hypertension: Secondary | ICD-10-CM

## 2023-02-04 DIAGNOSIS — E1169 Type 2 diabetes mellitus with other specified complication: Secondary | ICD-10-CM

## 2023-02-04 DIAGNOSIS — F1910 Other psychoactive substance abuse, uncomplicated: Secondary | ICD-10-CM

## 2023-02-04 DIAGNOSIS — I2510 Atherosclerotic heart disease of native coronary artery without angina pectoris: Secondary | ICD-10-CM

## 2023-02-04 DIAGNOSIS — E785 Hyperlipidemia, unspecified: Secondary | ICD-10-CM

## 2023-02-18 ENCOUNTER — Encounter: Payer: Self-pay | Admitting: Nurse Practitioner

## 2023-04-01 NOTE — Progress Notes (Addendum)
 Cardiology Office Note    Patient Name: Willie Walker Date of Encounter: 04/01/2023  Primary Care Provider:  Collins Dean, NP Primary Cardiologist:  Willie Mediate, MD Primary Electrophysiologist: None   Past Medical History    Past Medical History:  Diagnosis Date   Allergy    Anginal pain (HCC)    Anxiety    Arthritis    Childhood asthma    Chronic lower back pain    Coronary artery disease    Diabetes mellitus without complication (HCC)    ELECTROCARDIOGRAM, ABNORMAL    GERD (gastroesophageal reflux disease)    Gout    Headache(784.0) 09/18/2011   "maybe twice/wk; pressure on the brain"   High cholesterol    Hypertension    Kidney stones    Lower GI bleed 09/18/2011   "clots and everything; not lately"   Myocardial infarction [facility]) 10/2005   Peripheral vascular disease (HCC)    LLE   Pneumonia 1990's   Seizures (HCC) 09/18/2011   "blanks out on me"/wife's report- pt states never had a seizure- said he would black out from pain yrs ago but never had a seizure -  08-15-2018   Shortness of breath 09/18/2011   "@ rest; lying down; w/exertion"    History of Present Illness  Willie Walker is a 58 y.o. male with a PMH of CAD s/p multiple PCI's and CABG x 5 07/2015, HTN, DM type II, CKD III b, HLD, PVD, polysubstance abuse who presents today for 1 year follow-up.   Mr. Willie Walker was seen initially by Dr. Stann Walker in 2010 for complaint of chest pain.  He has several stents placed in [place] in 2007 and relook cath completed 2013 showing ISR mid RCA and mid circumflex. He underwent CABG x5V utilizing LIMA to LAD, SVG to Diagonal, Sequential SVG to Ramus Intermediate and OM, and Radial artery to the PDA on 08/05/15 with Dr. Luna Walker.  Postprocedure was complicated by pericarditis and volume overload and he was treated with diuretics and colchicine .  He was last seen by Dr. Stann Walker on 12/21/2019 with no cardiac complaints only right shoulder pain.  He was referred to the lipid  clinic due to myalgias and is currently on Zocor .  He was approved to start Nexlizet  and Repatha .  Mr. Willie Walker presents today for overdue follow-up.  He reports since his previous visit that he has been doing well from a cardiac perspective.  He denies any chest pain or shortness of breath with activity. He has been experiencing shoulder pain, which he attributes to a previous injury. He reports no chest pain or shortness of breath. He is currently on metoprolol  and lisinopril  for his heart condition. He has a history of smoking for 35 years but has since quit. He also reports occasional marijuana use and beer consumption. He also has chronic kidney disease, which he was not aware of until this visit. He has not seen a kidney specialist yet but plans to do so. He also has high cholesterol, which remains elevated despite being on simvastatin . He has been approved for Nexlizet  and Repatha  but has not started these medications due to concerns about side effects. He is currently homeless and has transportation issues, which have affected his ability to attend medical appointments. He is physically active, walking significant distances daily. He reports no pain in his legs during these walks. He denies chest pain, palpitations, dyspnea, PND, orthopnea, nausea, vomiting, dizziness, syncope, edema, weight gain, or early satiety.   Review of Systems  Please see the history of present illness.    All other systems reviewed and are otherwise negative except as noted above.  Physical Exam    Wt Readings from Last 3 Encounters:  01/04/23 156 lb 3.2 oz (70.9 kg)  11/09/22 159 lb (72.1 kg)  09/02/22 163 lb 3.2 oz (74 kg)   [id number] were no vitals filed for this visit.,There is no height or weight on file to calculate BMI. GEN: Well nourished, well developed in no acute distress Neck: No JVD; right carotid bruit Pulmonary: Clear to auscultation without rales, wheezing or rhonchi  Cardiovascular: Normal rate.  Regular rhythm. Normal S1. Normal S2.   Murmurs: There is no murmur.  ABDOMEN: Soft, non-tender, non-distended EXTREMITIES:  No edema; No deformity   EKG/LABS/ Recent Cardiac Studies   ECG personally reviewed by me today -sinus rhythm with rate of 62 bpm in leads III and aVF consistent with previous EKG.  Risk Assessment/Calculations:          Lab Results  Component Value Date   WBC 5.0 01/04/2023   HGB 14.8 01/04/2023   HCT 44.5 01/04/2023   MCV 88 01/04/2023   PLT 280 01/04/2023   Lab Results  Component Value Date   CREATININE 1.76 (H) 01/04/2023   BUN 34 (H) 01/04/2023   NA 142 01/04/2023   K 5.6 (H) 01/04/2023   CL 103 01/04/2023   CO2 22 01/04/2023   Lab Results  Component Value Date   CHOL 298 (H) 11/09/2022   HDL 57 11/09/2022   LDLCALC 169 (H) 11/09/2022   LDLDIRECT 123 (H) 04/02/2020   TRIG 375 (H) 11/09/2022   CHOLHDL 5.2 (H) 11/09/2022    Lab Results  Component Value Date   HGBA1C 7.8 (A) 01/04/2023   Assessment & Plan    1.  Coronary artery disease: -s/p multiple PCI's and CABG x 5 in 2017 -Today patient reports no chest pain or shortness of breath since previous visit. -He has been compliant with his Zocor  however his cholesterol numbers are above goal. -Explained risk of elevated cholesterol and potential need for further interventions. - Refer to Lipid Clinic for hyperlipidemia management. - Prescribe nitroglycerin  for emergency use. -Continue current GDMT with metoprolol  12.5 mg twice daily and will restart ASA 81 mg daily  - Last echo 2017 we will order update  2.  Essential hypertension: -Patient's blood pressure today was stable at 102/60 -Continue lisinopril  5 mg daily and metoprolol  12.5 mg twice daily   3.  Hyperlipidemia: -Previously documented myalgias and is currently on Zocor  40 mg with no complaints. -His most recent LDL was 169 and triglycerides were 375 with total cholesterol of 298 -. Discussed importance of cholesterol  control to prevent cardiac events. Nexlizet  and Repatha  previously approved as alternatives. - Refer to Lipid Clinic for evaluation and potential initiation of Nexlizet  and Repatha . - Check cholesterol levels today.   4.  DM type II: -Patient's last hemoglobin A1c was 7.8 -Continue current treatment plan per PCP   5.  History of polysubstance abuse: -Patient reports that he only uses occasional marijuana and does drink beer and alcohol socially.  6. Chronic Kidney Disease Stage 3B Chronic kidney disease stage 3B with moderate to severe decrease in kidney function. Explained importance of nephrology follow-up to prevent progression to dialysis. - Refer to nephrology for evaluation and management. - Check kidney function today.  7.Social and Economic Barriers Transportation issues and potential housing instability impacting healthcare access and medication adherence. Discussed referral to social  work for assistance. - Refer to social work for assistance with transportation and housing resources  8. Right Carotid Bruit: -Patient had a right carotid bruit auscultated on exam -We will evaluate further with carotid ultrasound   Disposition: Follow-up with Willie Mediate, MD or APP in 3 months    Signed, Francene Ing, Retha Cast, NP 04/01/2023, 7:40 AM [facility]

## 2023-04-02 ENCOUNTER — Encounter: Payer: Self-pay | Admitting: Nurse Practitioner

## 2023-04-02 ENCOUNTER — Ambulatory Visit: Payer: Medicaid Other | Attending: Cardiology | Admitting: Nurse Practitioner

## 2023-04-02 ENCOUNTER — Telehealth (HOSPITAL_COMMUNITY): Payer: Self-pay | Admitting: Licensed Clinical Social Worker

## 2023-04-02 VITALS — BP 102/60 | HR 62 | Ht 71.0 in | Wt 157.4 lb

## 2023-04-02 DIAGNOSIS — I10 Essential (primary) hypertension: Secondary | ICD-10-CM

## 2023-04-02 DIAGNOSIS — I2510 Atherosclerotic heart disease of native coronary artery without angina pectoris: Secondary | ICD-10-CM | POA: Diagnosis not present

## 2023-04-02 DIAGNOSIS — E1169 Type 2 diabetes mellitus with other specified complication: Secondary | ICD-10-CM | POA: Diagnosis not present

## 2023-04-02 DIAGNOSIS — E785 Hyperlipidemia, unspecified: Secondary | ICD-10-CM | POA: Diagnosis not present

## 2023-04-02 DIAGNOSIS — R0989 Other specified symptoms and signs involving the circulatory and respiratory systems: Secondary | ICD-10-CM

## 2023-04-02 DIAGNOSIS — F1910 Other psychoactive substance abuse, uncomplicated: Secondary | ICD-10-CM

## 2023-04-02 DIAGNOSIS — Z599 Problem related to housing and economic circumstances, unspecified: Secondary | ICD-10-CM

## 2023-04-02 DIAGNOSIS — N1832 Chronic kidney disease, stage 3b: Secondary | ICD-10-CM

## 2023-04-02 MED ORDER — ASPIRIN 81 MG PO TBEC: 81.0000 mg | DELAYED_RELEASE_TABLET | Freq: Every day | ORAL | Status: AC

## 2023-04-02 MED ORDER — NITROGLYCERIN 0.4 MG SL SUBL: 0.4000 mg | SUBLINGUAL_TABLET | SUBLINGUAL | 3 refills | Status: AC | PRN

## 2023-04-02 NOTE — Telephone Encounter (Signed)
 CSW attempted to call pt regarding referral for housing instability and transportation concerns- unable to reach- unable to leave VM- will continue to attempt contact  Burna Sis, LCSW Clinical Social Worker [facility] Desk#: [phone] Cell#: [phone]

## 2023-04-02 NOTE — Patient Instructions (Addendum)
 Medication Instructions:  START Aspirin 81mg  Take 1 tablet once a day  START Nitroglycerin 0.4mg  Take 1 as needed for emergency chest pain. Take first dose for emergency chest pain; WAIT 5 minutes and then take 2nd dose. IF still having pain if still having chest pain CALL 911 wait an additional 5 minutes before taking final dose. Do not take more than 3 doses in a day. *If you need a refill on your cardiac medications before your next appointment, please call your pharmacy*   Lab Work: TODAY-CMET & LIPIDS If you have labs (blood work) drawn today and your tests are completely normal, you will receive your results only by: MyChart Message (if you have MyChart) OR A paper copy in the mail If you have any lab test that is abnormal or we need to change your treatment, we will call you to review the results.   Testing/Procedures: Your physician has requested that you have a carotid duplex. This test is an ultrasound of the carotid arteries in your neck. It looks at blood flow through these arteries that supply the brain with blood. Allow one hour for this exam. There are no restrictions or special instructions.  Your physician has requested that you have an echocardiogram. Echocardiography is a painless test that uses sound waves to create images of your heart. It provides your doctor with information about the size and shape of your heart and how well your heart's chambers and valves are working. This procedure takes approximately one hour. There are no restrictions for this procedure. Please do NOT wear cologne, perfume, aftershave, or lotions (deodorant is allowed). Please arrive 15 minutes prior to your appointment time.  Please note: We ask at that you not bring children with you during ultrasound (echo/ vascular) testing. Due to room size and safety concerns, children are not allowed in the ultrasound rooms during exams. Our front office staff cannot provide observation of children in our  lobby area while testing is being conducted. An adult accompanying a patient to their appointment will only be allowed in the ultrasound room at the discretion of the ultrasound technician under special circumstances. We apologize for any inconvenience.   Follow-Up: At [facility], you and your health needs are our priority.  As part of our continuing mission to provide you with exceptional heart care, we have created designated Provider Care Teams.  These Care Teams include your primary Cardiologist (physician) and Advanced Practice Providers (APPs -  Physician Assistants and Nurse Practitioners) who all work together to provide you with the care you need, when you need it.  We recommend signing up for the patient portal called "MyChart".  Sign up information is provided on this After Visit Summary.  MyChart is used to connect with patients for Virtual Visits (Telemedicine).  Patients are able to view lab/test results, encounter notes, upcoming appointments, etc.  Non-urgent messages can be sent to your provider as well.   To learn more about what you can do with MyChart, go to [email or web address].    Your next appointment:   3 month(s)  Provider:   Robin Searing, NP       Other Instructions You have been referred to PharmD and Care guide  Heart-Healthy Eating Plan Eating a healthy diet is important for the health of your heart. A heart-healthy eating plan includes: Eating less unhealthy fats. Eating more healthy fats. Eating less salt in your food. Salt is also called sodium. Making other changes in your diet. Talk with your  doctor or a diet specialist (dietitian) to create an eating plan that is right for you. What is my plan? Your doctor may recommend an eating plan that includes: Total fat: ______% or less of total calories a day. Saturated fat: ______% or less of total calories a day. Cholesterol: less than _________mg a day. Sodium: less than _________mg a day. What  are tips for following this plan? Cooking Avoid frying your food. Try to bake, boil, grill, or broil it instead. You can also reduce fat by: Removing the skin from poultry. Removing all visible fats from meats. Steaming vegetables in water or broth. Meal planning  At meals, divide your plate into four equal parts: Fill one-half of your plate with vegetables and green salads. Fill one-fourth of your plate with whole grains. Fill one-fourth of your plate with lean protein foods. Eat 2-4 cups of vegetables per day. One cup of vegetables is: 1 cup (91 g) broccoli or cauliflower florets. 2 medium carrots. 1 large bell pepper. 1 large sweet potato. 1 large tomato. 1 medium white potato. 2 cups (150 g) raw leafy greens. Eat 1-2 cups of fruit per day. One cup of fruit is: 1 small apple 1 large banana 1 cup (237 g) mixed fruit, 1 large orange,  cup (82 g) dried fruit, 1 cup (240 mL) 100% fruit juice. Eat more foods that have soluble fiber. These are apples, broccoli, carrots, beans, peas, and barley. Try to get 20-30 g of fiber per day. Eat 4-5 servings of nuts, legumes, and seeds per week: 1 serving of dried beans or legumes equals  cup (90 g) cooked. 1 serving of nuts is  oz (12 almonds, 24 pistachios, or 7 walnut halves). 1 serving of seeds equals  oz (8 g). General information Eat more home-cooked food. Eat less restaurant, buffet, and fast food. Limit or avoid alcohol. Limit foods that are high in starch and sugar. Avoid fried foods. Lose weight if you are overweight. Keep track of how much salt (sodium) you eat. This is important if you have high blood pressure. Ask your doctor to tell you more about this. Try to add vegetarian meals each week. Fats Choose healthy fats. These include olive oil and canola oil, flaxseeds, walnuts, almonds, and seeds. Eat more omega-3 fats. These include salmon, mackerel, sardines, tuna, flaxseed oil, and ground flaxseeds. Try to eat fish  at least 2 times each week. Check food labels. Avoid foods with trans fats or high amounts of saturated fat. Limit saturated fats. These are often found in animal products, such as meats, butter, and cream. These are also found in plant foods, such as palm oil, palm kernel oil, and coconut oil. Avoid foods with partially hydrogenated oils in them. These have trans fats. Examples are stick margarine, some tub margarines, cookies, crackers, and other baked goods. What foods should I eat? Fruits All fresh, canned (in natural juice), or frozen fruits. Vegetables Fresh or frozen vegetables (raw, steamed, roasted, or grilled). Green salads. Grains Most grains. Choose whole wheat and whole grains most of the time. Rice and pasta, including brown rice and pastas made with whole wheat. Meats and other proteins Lean, well-trimmed beef, veal, pork, and lamb. Chicken and [place] without skin. All fish and shellfish. Wild duck, rabbit, pheasant, and venison. Egg whites or low-cholesterol egg substitutes. Dried beans, peas, lentils, and tofu. Seeds and most nuts. Dairy Low-fat or nonfat cheeses, including ricotta and mozzarella. Skim or 1% milk that is liquid, powdered, or evaporated. Buttermilk that  is made with low-fat milk. Nonfat or low-fat yogurt. Fats and oils Non-hydrogenated (trans-free) margarines. Vegetable oils, including soybean, sesame, sunflower, olive, peanut, safflower, corn, canola, and cottonseed. Salad dressings or mayonnaise made with a vegetable oil. Beverages Mineral water. Coffee and tea. Diet carbonated beverages. Sweets and desserts Sherbet, gelatin, and fruit ice. Small amounts of dark chocolate. Limit all sweets and desserts. Seasonings and condiments All seasonings and condiments. The items listed above may not be a complete list of foods and drinks you can eat. Contact a dietitian for more options. What foods should I avoid? Fruits Canned fruit in heavy syrup. Fruit in  cream or butter sauce. Fried fruit. Limit coconut. Vegetables Vegetables cooked in cheese, cream, or butter sauce. Fried vegetables. Grains Breads that are made with saturated or trans fats, oils, or whole milk. Croissants. Sweet rolls. Donuts. High-fat crackers, such as cheese crackers. Meats and other proteins Fatty meats, such as hot dogs, ribs, sausage, bacon, rib-eye roast or steak. High-fat deli meats, such as salami and bologna. Caviar. Domestic duck and goose. Organ meats, such as liver. Dairy Cream, sour cream, cream cheese, and creamed cottage cheese. Whole-milk cheeses. Whole or 2% milk that is liquid, evaporated, or condensed. Whole buttermilk. Cream sauce or high-fat cheese sauce. Yogurt that is made from whole milk. Fats and oils Meat fat, or shortening. Cocoa butter, hydrogenated oils, palm oil, coconut oil, palm kernel oil. Solid fats and shortenings, including bacon fat, salt pork, lard, and butter. Nondairy cream substitutes. Salad dressings with cheese or sour cream. Beverages Regular sodas and juice drinks with added sugar. Sweets and desserts Frosting. Pudding. Cookies. Cakes. Pies. Milk chocolate or white chocolate. Buttered syrups. Full-fat ice cream or ice cream drinks. The items listed above may not be a complete list of foods and drinks to avoid. Contact a dietitian for more information. Summary Heart-healthy meal planning includes eating less unhealthy fats, eating more healthy fats, and making other changes in your diet. Eat a balanced diet. This includes fruits and vegetables, low-fat or nonfat dairy, lean protein, nuts and legumes, whole grains, and heart-healthy oils and fats. This information is not intended to replace advice given to you by your health care provider. Make sure you discuss any questions you have with your health care provider. Document Revised: 02/10/2021 Document Reviewed: 02/10/2021 Elsevier Patient Education  2024 [employer]      Low-Sodium Eating Plan Salt (sodium) helps you keep a healthy balance of fluids in your body. Too much sodium can raise your blood pressure. It can also cause fluid and waste to be held in your body. Your health care provider or dietitian may recommend a low-sodium eating plan if you have high blood pressure (hypertension), kidney disease, liver disease, or heart failure. Eating less sodium can help lower your blood pressure and reduce swelling. It can also protect your heart, liver, and kidneys. What are tips for following this plan? Reading food labels  Check food labels for the amount of sodium per serving. If you eat more than one serving, you must multiply the listed amount by the number of servings. Choose foods with less than 140 milligrams (mg) of sodium per serving. Avoid foods with 300 mg of sodium or more per serving. Always check how much sodium is in a product, even if the label says "unsalted" or "no salt added." Shopping  Buy products labeled as "low-sodium" or "no salt added." Buy fresh foods. Avoid canned foods and pre-made or frozen meals. Avoid canned, cured, or processed  meats. Buy breads that have less than 80 mg of sodium per slice. Cooking  Eat more home-cooked food. Try to eat less restaurant, buffet, and fast food. Try not to add salt when you cook. Use salt-free seasonings or herbs instead of table salt or sea salt. Check with your provider or pharmacist before using salt substitutes. Cook with plant-based oils, such as canola, sunflower, or olive oil. Meal planning When eating at a restaurant, ask if your food can be made with less salt or no salt. Avoid dishes labeled as brined, pickled, cured, or smoked. Avoid dishes made with soy sauce, miso, or teriyaki sauce. Avoid foods that have monosodium glutamate (MSG) in them. MSG may be added to some restaurant food, sauces, soups, bouillon, and canned foods. Make meals that can be grilled, baked, poached, roasted, or  steamed. These are often made with less sodium. General information Try to limit your sodium intake to 1,500-2,300 mg each day, or the amount told by your provider. What foods should I eat? Fruits Fresh, frozen, or canned fruit. Fruit juice. Vegetables Fresh or frozen vegetables. "No salt added" canned vegetables. "No salt added" tomato sauce and paste. Low-sodium or reduced-sodium tomato and vegetable juice. Grains Low-sodium cereals, such as oats, puffed wheat and rice, and shredded wheat. Low-sodium crackers. Unsalted rice. Unsalted pasta. Low-sodium bread. Whole grain breads and whole grain pasta. Meats and other proteins Fresh or frozen meat, poultry, seafood, and fish. These should have no added salt. Low-sodium canned tuna and salmon. Unsalted nuts. Dried peas, beans, and lentils without added salt. Unsalted canned beans. Eggs. Unsalted nut butters. Dairy Milk. Soy milk. Cheese that is naturally low in sodium, such as ricotta cheese, fresh mozzarella, or Swiss cheese. Low-sodium or reduced-sodium cheese. Cream cheese. Yogurt. Seasonings and condiments Fresh and dried herbs and spices. Salt-free seasonings. Low-sodium mustard and ketchup. Sodium-free salad dressing. Sodium-free light mayonnaise. Fresh or refrigerated horseradish. Lemon juice. Vinegar. Other foods Homemade, reduced-sodium, or low-sodium soups. Unsalted popcorn and pretzels. Low-salt or salt-free chips. The items listed above may not be all the foods and drinks you can have. Talk to a dietitian to learn more. What foods should I avoid? Vegetables Sauerkraut, pickled vegetables, and relishes. Olives. [place] fries. Onion rings. Regular canned vegetables, except low-sodium or reduced-sodium items. Regular canned tomato sauce and paste. Regular tomato and vegetable juice. Frozen vegetables in sauces. Grains Instant hot cereals. Bread stuffing, pancake, and biscuit mixes. Croutons. Seasoned rice or pasta mixes. Noodle soup  cups. Boxed or frozen macaroni and cheese. Regular salted crackers. Self-rising flour. Meats and other proteins Meat or fish that is salted, canned, smoked, spiced, or pickled. Precooked or cured meat, such as sausages or meat loaves. Tomasa Blase. Ham. Pepperoni. Hot dogs. Corned beef. Chipped beef. Salt pork. Jerky. Pickled herring, anchovies, and sardines. Regular canned tuna. Salted nuts. Dairy Processed cheese and cheese spreads. Hard cheeses. Cheese curds. Blue cheese. Feta cheese. String cheese. Regular cottage cheese. Buttermilk. Canned milk. Fats and oils Salted butter. Regular margarine. Ghee. Bacon fat. Seasonings and condiments Onion salt, garlic salt, seasoned salt, table salt, and sea salt. Canned and packaged gravies. Worcestershire sauce. Tartar sauce. Barbecue sauce. Teriyaki sauce. Soy sauce, including reduced-sodium soy sauce. Steak sauce. Fish sauce. Oyster sauce. Cocktail sauce. Horseradish that you find on the shelf. Regular ketchup and mustard. Meat flavorings and tenderizers. Bouillon cubes. Hot sauce. Pre-made or packaged marinades. Pre-made or packaged taco seasonings. Relishes. Regular salad dressings. Salsa. Other foods Salted popcorn and pretzels. Corn chips and puffs. Potato  and tortilla chips. Canned or dried soups. Pizza. Frozen entrees and pot pies. The items listed above may not be all the foods and drinks you should avoid. Talk to a dietitian to learn more. This information is not intended to replace advice given to you by your health care provider. Make sure you discuss any questions you have with your health care provider. Document Revised: 01/22/2022 Document Reviewed: 01/22/2022 Elsevier Patient Education  2024 [employer].

## 2023-04-05 ENCOUNTER — Ambulatory Visit: Payer: Medicaid Other | Admitting: Nurse Practitioner

## 2023-04-06 ENCOUNTER — Telehealth (HOSPITAL_COMMUNITY): Payer: Self-pay | Admitting: Licensed Clinical Social Worker

## 2023-04-06 NOTE — Telephone Encounter (Signed)
 CSW attempted to call pt regarding SDOH concerns- unable to reach- VM full so unable to leave message  Will continue to attempt contact  Burna Sis, LCSW Clinical Social Worker [facility] Desk#: [phone] Cell#: [phone]

## 2023-04-14 ENCOUNTER — Telehealth (HOSPITAL_COMMUNITY): Payer: Self-pay | Admitting: Licensed Clinical Social Worker

## 2023-04-14 NOTE — Telephone Encounter (Signed)
 CSW attempted to call pt to discuss SDOH concerns- unable to reach- unable to leave VM.  Burna Sis, LCSW Clinical Social Worker [facility] Desk#: [phone] Cell#: [phone]

## 2023-04-15 ENCOUNTER — Ambulatory Visit: Attending: Physician Assistant | Admitting: Physician Assistant

## 2023-04-15 ENCOUNTER — Encounter: Payer: Self-pay | Admitting: Physician Assistant

## 2023-04-15 VITALS — BP 101/69 | HR 71 | Resp 19 | Ht 71.0 in

## 2023-04-15 DIAGNOSIS — M25511 Pain in right shoulder: Secondary | ICD-10-CM

## 2023-04-15 DIAGNOSIS — G8929 Other chronic pain: Secondary | ICD-10-CM | POA: Diagnosis not present

## 2023-04-15 DIAGNOSIS — M25512 Pain in left shoulder: Secondary | ICD-10-CM

## 2023-04-15 DIAGNOSIS — E1165 Type 2 diabetes mellitus with hyperglycemia: Secondary | ICD-10-CM

## 2023-04-15 DIAGNOSIS — Z7984 Long term (current) use of oral hypoglycemic drugs: Secondary | ICD-10-CM

## 2023-04-15 DIAGNOSIS — K219 Gastro-esophageal reflux disease without esophagitis: Secondary | ICD-10-CM

## 2023-04-15 DIAGNOSIS — R809 Proteinuria, unspecified: Secondary | ICD-10-CM | POA: Diagnosis not present

## 2023-04-15 DIAGNOSIS — I10 Essential (primary) hypertension: Secondary | ICD-10-CM | POA: Diagnosis not present

## 2023-04-15 DIAGNOSIS — M1000 Idiopathic gout, unspecified site: Secondary | ICD-10-CM

## 2023-04-15 LAB — POCT GLYCOSYLATED HEMOGLOBIN (HGB A1C): Hemoglobin A1C: 7.7 % — AB (ref 4.0–5.6)

## 2023-04-15 LAB — GLUCOSE, POCT (MANUAL RESULT ENTRY): POC Glucose: 121 mg/dL — AB (ref 70–99)

## 2023-04-15 MED ORDER — GLIPIZIDE 5 MG PO TABS: 2.5000 mg | ORAL_TABLET | Freq: Two times a day (BID) | ORAL | 3 refills | Status: AC

## 2023-04-15 MED ORDER — METOPROLOL TARTRATE 25 MG PO TABS: 12.5000 mg | ORAL_TABLET | Freq: Two times a day (BID) | ORAL | 3 refills | Status: AC

## 2023-04-15 MED ORDER — ALLOPURINOL 300 MG PO TABS: 300.0000 mg | ORAL_TABLET | Freq: Every day | ORAL | 6 refills | Status: AC

## 2023-04-15 MED ORDER — OMEPRAZOLE 20 MG PO CPDR: 20.0000 mg | DELAYED_RELEASE_CAPSULE | Freq: Every day | ORAL | 6 refills | Status: AC

## 2023-04-15 MED ORDER — METFORMIN HCL 500 MG PO TABS: 1000.0000 mg | ORAL_TABLET | Freq: Two times a day (BID) | ORAL | 3 refills | Status: AC

## 2023-04-15 MED ORDER — LISINOPRIL 5 MG PO TABS: 5.0000 mg | ORAL_TABLET | Freq: Every day | ORAL | 3 refills | Status: AC

## 2023-04-15 NOTE — Progress Notes (Signed)
 Patient ID: Willie Walker, male   DOB: 04-Jan-1966, 58 y.o.   MRN: [id number]  [phone]

## 2023-04-15 NOTE — Patient Instructions (Signed)
 Drink 64 to 80 ounces water daily

## 2023-04-16 ENCOUNTER — Other Ambulatory Visit: Payer: Self-pay | Admitting: Physician Assistant

## 2023-04-16 LAB — BASIC METABOLIC PANEL WITH GFR
BUN/Creatinine Ratio: 20 (ref 9–20)
BUN: 59 mg/dL — ABNORMAL HIGH (ref 6–24)
CO2: 20 mmol/L (ref 20–29)
Calcium: 10.4 mg/dL — ABNORMAL HIGH (ref 8.7–10.2)
Chloride: 101 mmol/L (ref 96–106)
Creatinine, Ser: 2.9 mg/dL — ABNORMAL HIGH (ref 0.76–1.27)
Glucose: 116 mg/dL — ABNORMAL HIGH (ref 70–99)
Potassium: 6.1 mmol/L — ABNORMAL HIGH (ref 3.5–5.2)
Sodium: 137 mmol/L (ref 134–144)
eGFR: 24 mL/min/{1.73_m2} — ABNORMAL LOW (ref >59)

## 2023-04-16 MED ORDER — LOKELMA 10 G PO PACK: 10.0000 g | PACK | Freq: Every day | ORAL | 1 refills | Status: DC

## 2023-04-28 ENCOUNTER — Telehealth: Payer: Self-pay | Admitting: Nurse Practitioner

## 2023-04-28 NOTE — Telephone Encounter (Signed)
 Copied from CRM [id number]. Topic: Referral - Question >> Apr 28, 2023  3:40 PM Gery Pray wrote:  Reason for CRM: Patient stated that he forgot to ask in his previous appt if the provider would write him a referral to see a dentist so that he could get 2 of his teeth pulled. Patient states he would prefer a dentist that accepts his medicaid. Patient can be could be contacted at [phone].

## 2023-05-10 ENCOUNTER — Ambulatory Visit (HOSPITAL_BASED_OUTPATIENT_CLINIC_OR_DEPARTMENT_OTHER)
Admission: RE | Admit: 2023-05-10 | Discharge: 2023-05-10 | Disposition: A | Discharge status: Home / Self Care | Source: Ambulatory Visit | Attending: Nurse Practitioner | Admitting: Nurse Practitioner

## 2023-05-10 ENCOUNTER — Ambulatory Visit (HOSPITAL_COMMUNITY)
Admission: RE | Admit: 2023-05-10 | Discharge: 2023-05-10 | Disposition: A | Discharge status: Home / Self Care | Source: Ambulatory Visit | Attending: Nurse Practitioner | Admitting: Nurse Practitioner

## 2023-05-10 DIAGNOSIS — F1910 Other psychoactive substance abuse, uncomplicated: Secondary | ICD-10-CM

## 2023-05-10 DIAGNOSIS — E1169 Type 2 diabetes mellitus with other specified complication: Secondary | ICD-10-CM

## 2023-05-10 DIAGNOSIS — R0989 Other specified symptoms and signs involving the circulatory and respiratory systems: Secondary | ICD-10-CM

## 2023-05-10 DIAGNOSIS — I10 Essential (primary) hypertension: Secondary | ICD-10-CM

## 2023-05-10 DIAGNOSIS — E785 Hyperlipidemia, unspecified: Secondary | ICD-10-CM

## 2023-05-10 DIAGNOSIS — I2510 Atherosclerotic heart disease of native coronary artery without angina pectoris: Secondary | ICD-10-CM

## 2023-05-11 LAB — ECHOCARDIOGRAM COMPLETE
AR max vel: 3.65 cm2
AV Area VTI: 3.45 cm2
AV Area mean vel: 3.41 cm2
AV Mean grad: 1 mmHg
AV Peak grad: 2.5 mmHg
Ao pk vel: 0.79 m/s
Area-P 1/2: 2.69 cm2
Est EF: 55
MV M vel: 0.8 m/s
MV Peak grad: 2.6 mmHg
S' Lateral: 3.54 cm

## 2023-05-25 ENCOUNTER — Ambulatory Visit: Attending: Nurse Practitioner | Admitting: Nurse Practitioner

## 2023-05-25 ENCOUNTER — Encounter: Payer: Self-pay | Admitting: Nurse Practitioner

## 2023-05-25 VITALS — BP 139/78 | HR 54 | Resp 20 | Ht 71.0 in | Wt 158.6 lb

## 2023-05-25 DIAGNOSIS — E119 Type 2 diabetes mellitus without complications: Secondary | ICD-10-CM

## 2023-05-25 DIAGNOSIS — Z7984 Long term (current) use of oral hypoglycemic drugs: Secondary | ICD-10-CM | POA: Diagnosis not present

## 2023-05-25 DIAGNOSIS — Z951 Presence of aortocoronary bypass graft: Secondary | ICD-10-CM | POA: Diagnosis not present

## 2023-05-25 DIAGNOSIS — I10 Essential (primary) hypertension: Secondary | ICD-10-CM

## 2023-05-25 MED ORDER — GLUCOSE BLOOD VI STRP: ORAL_STRIP | 12 refills | Status: AC

## 2023-05-25 MED ORDER — SIMVASTATIN 40 MG PO TABS: 40.0000 mg | ORAL_TABLET | Freq: Every day | ORAL | 1 refills | Status: AC

## 2023-05-25 MED ORDER — ACCU-CHEK SOFTCLIX LANCETS MISC: 6 refills | Status: AC

## 2023-05-25 NOTE — Patient Instructions (Addendum)
 KIDNEY  [facility]  Ph. # [phone]  Address [address] [place] [address]   STOMACH DR COLONOSCOPY [facility] [address] [place], [place] [address] PH# [phone]

## 2023-05-25 NOTE — Progress Notes (Signed)
 Assessment & Plan:  Willie Walker was seen today for medical management of chronic issues and congestive heart failure.  Diagnoses and all orders for this visit:  Primary hypertension  S/P CABG (coronary artery bypass graft) -     CMP14+EGFR -     Lipid panel -     simvastatin  (ZOCOR ) 40 MG tablet; Take 1 tablet (40 mg total) by mouth at bedtime. FOR CHOLESTEROL  Diabetes mellitus treated with oral medication (HCC) -     Accu-Chek Softclix Lancets lancets; Use as instructed. Check blood glucose level by fingerstick 2-3 times  per day. -     glucose blood test strip; Use as instructed. Check blood glucose level by fingerstick 2-3 times per day.    Patient has been counseled on age-appropriate routine health concerns for screening and prevention. These are reviewed and up-to-date. Referrals have been placed accordingly. Immunizations are up-to-date or declined.    Subjective:   Chief Complaint  Patient presents with   Medical Management of Chronic Issues   Congestive Heart Failure    Sincere DORRANCE Walker 58 y.o. male presents to office today for follow up to HTN  He has a past medical history of Allergy, Anginal pain, Anxiety, Arthritis, Childhood asthma, Chronic lower back pain, Coronary artery disease, DM2, ELECTROCARDIOGRAM, ABNORMAL, GERD, Gout, Headache(784.0) (09/18/2011), High cholesterol, Hypertension, Kidney stones, Lower GI bleed (09/18/2011), Myocardial infarction  (10/2005), Peripheral vascular disease, Pneumonia (1990's), Seizures (09/18/2011)    He is followed by cardiology  Has missed both office calls to schedule his appointments with [facility] for colonoscopy and Nephrology for CKD stage3b  HTN Blood pressure is not  quite at goal of less than 130/80.  He is currently prescribed metoprolol  12.5 mg twice daily and lisinopril  5 mg daily.  He endorses adherence taking both. BP Readings from Last 3 Encounters:  05/25/23 139/78  04/15/23 101/69  04/02/23 102/60        Review of Systems  Constitutional:  Negative for fever, malaise/fatigue and weight loss.  HENT: Negative.  Negative for nosebleeds.   Eyes: Negative.  Negative for blurred vision, double vision and photophobia.  Respiratory: Negative.  Negative for cough and shortness of breath.   Cardiovascular: Negative.  Negative for chest pain, palpitations and leg swelling.  Gastrointestinal: Negative.  Negative for heartburn, nausea and vomiting.  Musculoskeletal: Negative.  Negative for myalgias.  Neurological: Negative.  Negative for dizziness, focal weakness, seizures and headaches.  Psychiatric/Behavioral: Negative.  Negative for suicidal ideas.     Past Medical History:  Diagnosis Date   Allergy    Anginal pain (HCC)    Anxiety    Arthritis    Childhood asthma    Chronic lower back pain    Coronary artery disease    Diabetes mellitus without complication (HCC)    ELECTROCARDIOGRAM, ABNORMAL    GERD (gastroesophageal reflux disease)    Gout    Headache(784.0) 09/18/2011   "maybe twice/wk; pressure on the brain"   High cholesterol    Hypertension    Kidney stones    Lower GI bleed 09/18/2011   "clots and everything; not lately"   Myocardial infarction [facility]) 10/2005   Peripheral vascular disease (HCC)    LLE   Pneumonia 1990's   Seizures (HCC) 09/18/2011   "blanks out on me"/wife's report- pt states never had a seizure- said he would black out from pain yrs ago but never had a seizure -  08-15-2018   Shortness of breath 09/18/2011   "@ rest; lying  down; w/exertion"    Past Surgical History:  Procedure Laterality Date   BURR HOLE OF CRANIUM  1999   "mugged"   CARDIAC CATHETERIZATION N/A 08/01/2015   Procedure: Left Heart Cath and Coronary Angiography;  Surgeon: Peter M Swaziland, MD;  Location: [facility] INVASIVE CV LAB;  Service: Cardiovascular;  Laterality: N/A;   CORONARY ANGIOPLASTY  09/18/2011   CORONARY ANGIOPLASTY WITH STENT PLACEMENT  10/2005   "9"   CORONARY ARTERY BYPASS GRAFT  N/A 08/05/2015   Procedure: CORONARY ARTERY BYPASS GRAFTING (CABG), ON PUMP, TIMES FIVE, USING LEFT INTERNAL MAMMARY ARTERY AND LEFT RADIAL ARTERY, RIGHT GREATER SAPHENOUS VEIN HARVESTED ENDOSCOPICALLY;  Surgeon: Zelphia Higashi, MD;  Location: [facility] OR;  Service: Open Heart Surgery;  Laterality: N/A;   LACERATION REPAIR  1999   BLE "mugging"   PERCUTANEOUS CORONARY STENT INTERVENTION (PCI-S) N/A 09/18/2011   Procedure: PERCUTANEOUS CORONARY STENT INTERVENTION (PCI-S);  Surgeon: Peter M Swaziland, MD;  Location: [facility] CATH LAB;  Service: Cardiovascular;  Laterality: N/A;   RADIAL ARTERY HARVEST Left 08/05/2015   Procedure: RADIAL ARTERY HARVEST;  Surgeon: Zelphia Higashi, MD;  Location: [facility] OR;  Service: Open Heart Surgery;  Laterality: Left;   TEE WITHOUT CARDIOVERSION N/A 08/05/2015   Procedure: TRANSESOPHAGEAL ECHOCARDIOGRAM (TEE);  Surgeon: Zelphia Higashi, MD;  Location: [facility] OR;  Service: Open Heart Surgery;  Laterality: N/A;    Family History  Problem Relation Age of Onset   Hypertension Mother    Dementia Mother    CVA Father    Heart attack Maternal Grandmother    Colon cancer Neg Hx    Colon polyps Neg Hx    Esophageal cancer Neg Hx    Rectal cancer Neg Hx    Stomach cancer Neg Hx     Social History Reviewed with no changes to be made today.   Outpatient Medications Prior to Visit  Medication Sig Dispense Refill   allopurinol  (ZYLOPRIM ) 300 MG tablet Take 1 tablet (300 mg total) by mouth daily. 30 tablet 6   aspirin  EC 81 MG tablet Take 1 tablet (81 mg total) by mouth daily. Swallow whole.     glipiZIDE  (GLUCOTROL ) 5 MG tablet Take 0.5 tablets (2.5 mg total) by mouth 2 (two) times daily before a meal. 30 tablet 3   lisinopril  (ZESTRIL ) 5 MG tablet Take 1 tablet (5 mg total) by mouth daily. 90 tablet 3   metFORMIN  (GLUCOPHAGE ) 500 MG tablet Take 2 tablets (1,000 mg total) by mouth 2 (two) times daily with a meal. = 360 tablet 3   metoprolol  tartrate (LOPRESSOR ) 25 MG tablet  Take 0.5 tablets (12.5 mg total) by mouth 2 (two) times daily. 90 tablet 3   nitroGLYCERIN  (NITROSTAT ) 0.4 MG SL tablet Place 1 tablet (0.4 mg total) under the tongue every 5 (five) minutes as needed. 25 tablet 3   omeprazole  (PRILOSEC) 20 MG capsule Take 1 capsule (20 mg total) by mouth daily. Needs appt for more refills. 30 capsule 6   simvastatin  (ZOCOR ) 40 MG tablet TAKE 1 TABLET BY MOUTH AT BEDTIME 30 tablet 0   Blood Glucose Monitoring Suppl (ACCU-CHEK GUIDE) w/Device KIT 1 each by Does not apply route daily. Use as instructed. Check blood glucose level by fingerstick 2-3 times per day. (Patient not taking: Reported on 05/25/2023) 1 kit 0   Accu-Chek FastClix Lancets MISC Use as directed to test blood sugar once daily (Patient not taking: Reported on 11/09/2022) 102 each 12   Accu-Chek Softclix Lancets lancets Use as instructed.  Check blood glucose level by fingerstick 2-3 times  per day. (Patient not taking: Reported on 05/25/2023) 100 each 6   glucose blood (ACCU-CHEK GUIDE) test strip Use as instructed to test blood sugar once daily (Patient not taking: Reported on 11/09/2022) 100 each 12   glucose blood test strip Use as instructed. Check blood glucose level by fingerstick 2-3 times per day. (Patient not taking: Reported on 05/25/2023) 100 each 12   No facility-administered medications prior to visit.    Allergies  Allergen Reactions   Crestor [Rosuvastatin] Other (See Comments)    "Makes my legs hurt" - tolerates simvastatin         Objective:    BP 139/78 (BP Location: Left Arm, Patient Position: Sitting, Cuff Size: Normal)   Pulse (!) 54   Resp 20   Ht 5\' 11"  (1.803 m)   Wt 158 lb 9.6 oz (71.9 kg)   SpO2 100%   BMI 22.12 kg/m  Wt Readings from Last 3 Encounters:  05/25/23 158 lb 9.6 oz (71.9 kg)  04/02/23 157 lb 6.4 oz (71.4 kg)  01/04/23 156 lb 3.2 oz (70.9 kg)    Physical Exam Vitals and nursing note reviewed.  Constitutional:      Appearance: He is well-developed.   HENT:     Head: Normocephalic and atraumatic.  Cardiovascular:     Rate and Rhythm: Regular rhythm. Bradycardia present.     Heart sounds: Normal heart sounds. No murmur heard.    No friction rub. No gallop.  Pulmonary:     Effort: Pulmonary effort is normal. No tachypnea or respiratory distress.     Breath sounds: Normal breath sounds. No decreased breath sounds, wheezing, rhonchi or rales.  Chest:     Chest wall: No tenderness.  Abdominal:     General: Bowel sounds are normal.     Palpations: Abdomen is soft.  Musculoskeletal:        General: Normal range of motion.     Cervical back: Normal range of motion.  Skin:    General: Skin is warm and dry.  Neurological:     Mental Status: He is alert and oriented to person, place, and time.     Coordination: Coordination normal.  Psychiatric:        Behavior: Behavior normal. Behavior is cooperative.        Thought Content: Thought content normal.        Judgment: Judgment normal.          Patient has been counseled extensively about nutrition and exercise as well as the importance of adherence with medications and regular follow-up. The patient was given clear instructions to go to ER or return to medical center if symptoms don't improve, worsen or new problems develop. The patient verbalized understanding.   Follow-up: Return in about 3 months (around 08/25/2023).   Collins Dean, FNP-BC [facility] [place], [place] [phone]   05/25/2023, 3:32 PM

## 2023-05-26 ENCOUNTER — Telehealth: Payer: Self-pay | Admitting: Licensed Clinical Social Worker

## 2023-05-26 ENCOUNTER — Ambulatory Visit: Attending: Cardiovascular Disease | Admitting: Pharmacist

## 2023-05-26 ENCOUNTER — Encounter: Payer: Self-pay | Admitting: Pharmacist

## 2023-05-26 DIAGNOSIS — R413 Other amnesia: Secondary | ICD-10-CM | POA: Diagnosis present

## 2023-05-26 DIAGNOSIS — E782 Mixed hyperlipidemia: Secondary | ICD-10-CM | POA: Diagnosis present

## 2023-05-26 DIAGNOSIS — I25119 Atherosclerotic heart disease of native coronary artery with unspecified angina pectoris: Secondary | ICD-10-CM

## 2023-05-26 DIAGNOSIS — F1910 Other psychoactive substance abuse, uncomplicated: Secondary | ICD-10-CM

## 2023-05-26 LAB — LIPID PANEL
Chol/HDL Ratio: 3.9 ratio (ref 0.0–5.0)
Cholesterol, Total: 254 mg/dL — ABNORMAL HIGH (ref 100–199)
HDL: 65 mg/dL (ref >39)
LDL Chol Calc (NIH): 144 mg/dL — ABNORMAL HIGH (ref 0–99)
Triglycerides: 249 mg/dL — ABNORMAL HIGH (ref 0–149)
VLDL Cholesterol Cal: 45 mg/dL — ABNORMAL HIGH (ref 5–40)

## 2023-05-26 LAB — CMP14+EGFR
ALT: 19 IU/L (ref 0–44)
AST: 24 IU/L (ref 0–40)
Albumin: 4.5 g/dL (ref 3.8–4.9)
Alkaline Phosphatase: 116 IU/L (ref 44–121)
BUN/Creatinine Ratio: 13 (ref 10–24)
BUN: 19 mg/dL (ref 8–27)
Bilirubin Total: <0.2 mg/dL (ref 0.0–1.2)
CO2: 24 mmol/L (ref 20–29)
Calcium: 10 mg/dL (ref 8.6–10.2)
Chloride: 103 mmol/L (ref 96–106)
Creatinine, Ser: 1.46 mg/dL — ABNORMAL HIGH (ref 0.76–1.27)
Globulin, Total: 3 g/dL (ref 1.5–4.5)
Glucose: 91 mg/dL (ref 70–99)
Potassium: 5.4 mmol/L — ABNORMAL HIGH (ref 3.5–5.2)
Sodium: 141 mmol/L (ref 134–144)
Total Protein: 7.5 g/dL (ref 6.0–8.5)
eGFR: 55 mL/min/{1.73_m2} — ABNORMAL LOW (ref >59)

## 2023-05-26 NOTE — Assessment & Plan Note (Signed)
 Assessment: LDL-C from yesterday was 144 which is above goal less than 55 Patient previously too afraid to do injections but now willing to consider However patient does not think he will remember to take every 14 days and is concerned about his compliance Patient does think that if he had to have an appointment for the injection that he would remember about the appointment (should get a appointment reminder call) Reports compliance with his simvastatin  Had muscle aches with rosuvastatin Reports decreasing his alcohol and marijuana use Denies issues obtaining food.  We reviewed decreasing saturated fat in the diet.  Handout on nutrition given to patient  Plan: Will see if patient's insurance will cover Leqvio.  Patient signed start form Continue simvastatin  40 mg daily

## 2023-05-26 NOTE — Progress Notes (Signed)
 Patient ID: Willie Walker                 DOB: Nov 25, 1965                    MRN: [id number]      HPI: Willie Walker is a 58 y.o. male patient of Dr. Stann Earnest referred to lipid clinic by Charles Connor. PMH is significant for  CAD s/p multiple PCI's and CABG x 5 07/2015, HTN, DM type II, CKD III b, HLD, PVD, polysubstance abuse    Per previous note patient had been approved for both Repatha  and Nexlizet  however he did not take either one.  Previously mentioned patient homeless, Child psychotherapist had attempted to reach out to him but looks like was unable to to speak to him.  Patient presents today to lipid clinic.  He reports that he is currently living in his mother's house.  His mother has passed and the house is going to be sold.  Once sold patient would not have anywhere to live.  He reports being on the list for low income housing for the last several years.  He states that he took care of his mother for several years.  Now that she is gone he is more determined to take care of himself.  He is cutting back on alcohol and marijuana use.  Patient admits that he was previously scared to give himself injections but is now prepared to do so.  However when reviewing the dosing of Repatha  patient states that I will never remember to do something every 2 weeks.  He reports that he does remember to take his daily medication.    Current Medications: simvastatin  40mg  Intolerances: rosuvastatin (muscle aches) Risk Factors: CAD, HTN, DM, CKD LDL-C goal: <55 ApoB goal: <60  Diet: chicken, lamb, fish  Exercise: walks  Family History:  Family History  Problem Relation Age of Onset   Hypertension Mother    Dementia Mother    CVA Father    Heart attack Maternal Grandmother    Colon cancer Neg Hx    Colon polyps Neg Hx    Esophageal cancer Neg Hx    Rectal cancer Neg Hx    Stomach cancer Neg Hx      Social History: no ETOH lately, 12 pack and a pint every 10 days previously, + marijuana, no  tobacco  Labs: Lipid Panel     Component Value Date/Time   CHOL 254 (H) 05/25/2023 1515   TRIG 249 (H) 05/25/2023 1515   HDL 65 05/25/2023 1515   CHOLHDL 3.9 05/25/2023 1515   CHOLHDL 4.4 07/31/2015 1051   VLDL 20 07/31/2015 1051   LDLCALC 144 (H) 05/25/2023 1515   LDLDIRECT 123 (H) 04/02/2020 1043   LABVLDL 45 (H) 05/25/2023 1515    Past Medical History:  Diagnosis Date   Allergy    Anginal pain (HCC)    Anxiety    Arthritis    Childhood asthma    Chronic lower back pain    Coronary artery disease    Diabetes mellitus without complication (HCC)    ELECTROCARDIOGRAM, ABNORMAL    GERD (gastroesophageal reflux disease)    Gout    Headache(784.0) 09/18/2011   "maybe twice/wk; pressure on the brain"   High cholesterol    Hypertension    Kidney stones    Lower GI bleed 09/18/2011   "clots and everything; not lately"   Myocardial infarction [facility]) 10/2005   Peripheral vascular disease (  HCC)    LLE   Pneumonia 1990's   Seizures (HCC) 09/18/2011   "blanks out on me"/wife's report- pt states never had a seizure- said he would black out from pain yrs ago but never had a seizure -  08-15-2018   Shortness of breath 09/18/2011   "@ rest; lying down; w/exertion"    Current Outpatient Medications on File Prior to Visit  Medication Sig Dispense Refill   Accu-Chek Softclix Lancets lancets Use as instructed. Check blood glucose level by fingerstick 2-3 times  per day. 100 each 6   allopurinol  (ZYLOPRIM ) 300 MG tablet Take 1 tablet (300 mg total) by mouth daily. 30 tablet 6   aspirin  EC 81 MG tablet Take 1 tablet (81 mg total) by mouth daily. Swallow whole.     Blood Glucose Monitoring Suppl (ACCU-CHEK GUIDE) w/Device KIT 1 each by Does not apply route daily. Use as instructed. Check blood glucose level by fingerstick 2-3 times per day. (Patient not taking: Reported on 05/25/2023) 1 kit 0   glipiZIDE  (GLUCOTROL ) 5 MG tablet Take 0.5 tablets (2.5 mg total) by mouth 2 (two) times daily  before a meal. 30 tablet 3   glucose blood test strip Use as instructed. Check blood glucose level by fingerstick 2-3 times per day. 100 each 12   lisinopril  (ZESTRIL ) 5 MG tablet Take 1 tablet (5 mg total) by mouth daily. 90 tablet 3   metFORMIN  (GLUCOPHAGE ) 500 MG tablet Take 2 tablets (1,000 mg total) by mouth 2 (two) times daily with a meal. = 360 tablet 3   metoprolol  tartrate (LOPRESSOR ) 25 MG tablet Take 0.5 tablets (12.5 mg total) by mouth 2 (two) times daily. 90 tablet 3   nitroGLYCERIN  (NITROSTAT ) 0.4 MG SL tablet Place 1 tablet (0.4 mg total) under the tongue every 5 (five) minutes as needed. 25 tablet 3   omeprazole  (PRILOSEC) 20 MG capsule Take 1 capsule (20 mg total) by mouth daily. Needs appt for more refills. 30 capsule 6   simvastatin  (ZOCOR ) 40 MG tablet Take 1 tablet (40 mg total) by mouth at bedtime. FOR CHOLESTEROL 90 tablet 1   No current facility-administered medications on file prior to visit.    Allergies  Allergen Reactions   Crestor [Rosuvastatin] Other (See Comments)    "Makes my legs hurt" - tolerates simvastatin      Assessment/Plan:  1. Hyperlipidemia -  No problem-specific Assessment & Plan notes found for this encounter.    Thank you,  Leta Bucklin D Shakiyah Cirilo, Pharm.D, BCACP, CPP  [facility] [address], [place], [place] [address]  Phone: [phone]; Fax: [phone]

## 2023-05-26 NOTE — Addendum Note (Signed)
 Addended by: Jatorian Renault D on: 05/26/2023 12:24 PM   Modules accepted: Orders

## 2023-05-26 NOTE — Progress Notes (Unsigned)
 Heart and Vascular Care Navigation  05/26/2023  Willie Walker 1965-08-23 [id number]  Reason for Referral: housing issues Patient is participating in a Managed Medicaid Plan:Yes  Engaged with patient by telephone for initial visit for Heart and Vascular Care Coordination.                                                                                                   Assessment:                          LCSW was able to reach pt at [phone]. Introduced self, role, reason for call. Pt states he didn't return previous LCSW calls from [facility], as he had something happen with his phone. Pt confirmed his current address on [address] is his mother's home who has passed. The house will be sold as part of the estate and once that happens he will not have a place to stay. Pt confirms he has utilities and no issues with those right now. Pt has no issues with obtaining or affording his medications at this time. He intermittently has challenges with transportation since he has only Medicaid transportation which can be used for medical appts. He has SNAP benefits, but could use additional food assistance resources.   LCSW discussed housing resources will be sparse with limited social security budget- pt per his report has been working with the [employer] for housing options for fixed income- he is not clear about what he applied for just that he has been on a waitlist for years. Pt agreeable to me sending referral to [facility] for Housing for housing lists, he is also okay with me sending other resources for food and housing assistance in the mail.   Checked in with pt on discussion of hx of substance use- pt states he is cutting back. I encouraged him to consider supportive cessation if needed, cautioned if he didn't feel well while cutting back that he should f/u with PCP or emergency department given multiple health concerns. Pt states he's never had issues with this before but encouraged him to  consider these if needed.   HRT/VAS Care Coordination     Patients Home Cardiology Office --  [facility]   Outpatient Care Team Social Worker   Social Worker Name: Nathen Balder, [place], [phone]   Living arrangements for the past 2 months Single Family Home   Lives with: Self   Patient Current Insurance Coverage Medicaid   Patient Has Concern With Paying Medical Bills No   Does Patient Have Prescription Coverage? Yes   Home Assistive Devices/Equipment CBG Meter; Cane (specify quad or straight); Eyeglasses  single cane       Social History:  SDOH Screenings   Food Insecurity: Food Insecurity Present (05/26/2023)  Housing: High Risk (05/26/2023)  Transportation Needs: No Transportation Needs (05/26/2023)  Utilities: Not At Risk (11/09/2022)  Alcohol Screen: Low Risk  (11/09/2022)  Depression (PHQ2-9): Medium Risk (05/25/2023)  Financial Resource Strain: High Risk (05/26/2023)  Physical Activity: Insufficiently Active (11/09/2022)  Social Connections: Unknown (11/09/2022)  Stress: No Stress Concern Present (11/09/2022)  Tobacco Use: Medium Risk (05/26/2023)  Health Literacy: Adequate Health Literacy (05/26/2023)    SDOH Interventions: Financial Resources:  Financial Strain Interventions: Other (Comment) (recommended going to social security)  Food Insecurity:  Food Insecurity Interventions: [employer] Provided (pt receives [employer])  Housing Insecurity:  Housing Interventions: Programmer, applications Provided  Transportation:   Transportation Interventions: Payor Benefit     Other Care Navigation Interventions:     Inpatient/Outpatient Substance Abuse Counseling/Rehab Options Pt declined at this time- encouraged him to be engaged in cessation support if cutting back ETOH use suddenly and to make sure that he goes to Emergency Dept if any concerns arise  Provided Pharmacy  assistance resources  Pt denies any issues obtaining or affording medications at this time, discussed [facility] pharmacies if needed   Follow-up plan:   LCSW will connect pt with information about affordable housing in [place] and additional food resources, pt already connected with [employer]. He is working on additional options with some help from family. Pt also agreeable to a referral to [facility] for Housing and [employer] Resources. Have made that via [employer]. Will f/u with pt to make sure he has received resources via mail.

## 2023-05-26 NOTE — Patient Instructions (Addendum)
 Please continue simvastatin  40mg  daily We will call you about the cholesterol shot once we make sure your insurance will cover it  I will ask a social worker to reach out to you as well

## 2023-06-03 ENCOUNTER — Ambulatory Visit: Payer: Self-pay | Admitting: Nurse Practitioner

## 2023-06-07 ENCOUNTER — Other Ambulatory Visit: Payer: Self-pay | Admitting: Pharmacist

## 2023-06-08 ENCOUNTER — Telehealth: Payer: Self-pay | Admitting: Licensed Clinical Social Worker

## 2023-06-08 NOTE — Telephone Encounter (Signed)
 H&V Care Navigation CSW Progress Note  Clinical Social Worker contacted patient by phone to f/u on community resources- was able to reach pt today at [phone]. Confirmed received resources and is reviewing them- will call back with any questions. Shared I noticed from [facility] referral to [facility] for Housing that they had attempted pt but per their call the number was restricted. He shares they should be able to reach him for now without issues at [phone] number. He may get new number as he has a new phone but at this time that number still worked. Unfortunately does not have email at this time for me to forward resources so I reached back out to [facility] for housing to let them know number above and that pt available as needed.  Patient is participating in a Managed Medicaid Plan:  Yes- wellcare  SDOH Screenings   Food Insecurity: Food Insecurity Present (05/26/2023)  Housing: High Risk (05/26/2023)  Transportation Needs: No Transportation Needs (05/26/2023)  Utilities: Not At Risk (11/09/2022)  Alcohol Screen: Low Risk  (11/09/2022)  Depression (PHQ2-9): Medium Risk (05/25/2023)  Financial Resource Strain: High Risk (05/26/2023)  Physical Activity: Insufficiently Active (11/09/2022)  Social Connections: Unknown (11/09/2022)  Stress: No Stress Concern Present (11/09/2022)  Tobacco Use: Medium Risk (05/26/2023)  Health Literacy: Adequate Health Literacy (05/26/2023)    Nathen Balder, MSW, LCSW Clinical Social Worker II [facility] Heart/Vascular Care Navigation  [phone]- work cell phone (preferred)

## 2023-06-09 ENCOUNTER — Telehealth: Payer: Self-pay | Admitting: Pharmacy Technician

## 2023-06-09 ENCOUNTER — Other Ambulatory Visit (HOSPITAL_COMMUNITY): Payer: Self-pay | Admitting: Cardiovascular Disease

## 2023-06-09 DIAGNOSIS — Z951 Presence of aortocoronary bypass graft: Secondary | ICD-10-CM

## 2023-06-09 DIAGNOSIS — E782 Mixed hyperlipidemia: Secondary | ICD-10-CM

## 2023-06-09 DIAGNOSIS — E119 Type 2 diabetes mellitus without complications: Secondary | ICD-10-CM

## 2023-06-09 NOTE — Telephone Encounter (Signed)
 Auth Submission: NO AUTH NEEDED Site of care: Site of care: [facility] Payer: [employer] Medication & CPT/J Code(s) submitted: Leqvio (Inclisiran) [phone] Route of submission (phone, fax, portal):  Phone # Fax # Auth type: Buy/Bill HB Units/visits requested: 284MG  Q3MONTHS X2, THEN Q6MONTHS Reference numberCaldwell Castles REF: [id number] Approval from: 06/09/23 to 01/19/24

## 2023-06-22 ENCOUNTER — Telehealth: Payer: Self-pay | Admitting: Licensed Clinical Social Worker

## 2023-06-22 NOTE — Telephone Encounter (Signed)
 H&V Care Navigation CSW Progress Note  Clinical Social Worker contacted patient by phone to f/u on community resources for housing. Was able to reach him this morning at [phone]. Confirmed he had spoken with Freddrick Jaffe and she sent him list from [facility] for Housing. He was supposed to have a call with her again, recommended he call her again- will do so after our call. Pt shares that he is not living at [address] address- updated his chart to [facility] address where he can receive mail at his sisters. Not clear where he is staying currently. Encouraged him to call if any additional resources needed.  Patient is participating in a Managed Medicaid Plan:  Yes- [employer]  SDOH Screenings   Food Insecurity: Food Insecurity Present (05/26/2023)  Housing: High Risk (05/26/2023)  Transportation Needs: No Transportation Needs (05/26/2023)  Utilities: Not At Risk (11/09/2022)  Alcohol Screen: Low Risk  (11/09/2022)  Depression (PHQ2-9): Medium Risk (05/25/2023)  Financial Resource Strain: High Risk (05/26/2023)  Physical Activity: Insufficiently Active (11/09/2022)  Social Connections: Unknown (11/09/2022)  Stress: No Stress Concern Present (11/09/2022)  Tobacco Use: Medium Risk (05/26/2023)  Health Literacy: Adequate Health Literacy (05/26/2023)    Nathen Balder, MSW, LCSW Clinical Social Worker II [facility] Heart/Vascular Care Navigation  [phone]- work cell phone (preferred)

## 2023-06-28 NOTE — Telephone Encounter (Signed)
 Have we tried to schedule pt for Leqvio?

## 2023-07-05 DIAGNOSIS — E119 Type 2 diabetes mellitus without complications: Secondary | ICD-10-CM | POA: Diagnosis not present

## 2023-07-05 DIAGNOSIS — H2513 Age-related nuclear cataract, bilateral: Secondary | ICD-10-CM | POA: Diagnosis not present

## 2023-07-05 LAB — HM DIABETES EYE EXAM

## 2023-07-06 ENCOUNTER — Ambulatory Visit

## 2023-07-06 ENCOUNTER — Encounter (HOSPITAL_COMMUNITY)
Admission: RE | Admit: 2023-07-06 | Discharge: 2023-07-06 | Disposition: A | Discharge status: Home / Self Care | Source: Ambulatory Visit | Attending: Cardiovascular Disease | Admitting: Cardiovascular Disease

## 2023-07-06 DIAGNOSIS — E782 Mixed hyperlipidemia: Secondary | ICD-10-CM | POA: Diagnosis present

## 2023-07-06 DIAGNOSIS — E119 Type 2 diabetes mellitus without complications: Secondary | ICD-10-CM | POA: Insufficient documentation

## 2023-07-06 DIAGNOSIS — Z951 Presence of aortocoronary bypass graft: Secondary | ICD-10-CM | POA: Diagnosis present

## 2023-07-06 MED ORDER — INCLISIRAN SODIUM 284 MG/1.5ML ~~LOC~~ SOSY: 284.0000 mg | PREFILLED_SYRINGE | Freq: Once | SUBCUTANEOUS | Status: DC

## 2023-07-06 MED ORDER — INCLISIRAN SODIUM 284 MG/1.5ML ~~LOC~~ SOSY
PREFILLED_SYRINGE | SUBCUTANEOUS | Status: AC
  Filled 2023-07-06: qty 1.5

## 2023-07-06 MED ORDER — INCLISIRAN SODIUM 284 MG/1.5ML ~~LOC~~ SOSY
284.0000 mg | PREFILLED_SYRINGE | Freq: Once | SUBCUTANEOUS | Status: AC
  Administered 2023-07-06: 284 mg via SUBCUTANEOUS

## 2023-07-09 DIAGNOSIS — H5213 Myopia, bilateral: Secondary | ICD-10-CM | POA: Diagnosis not present

## 2023-07-11 NOTE — Progress Notes (Unsigned)
 Cardiology Office Note    Patient Name: Willie Walker Date of Encounter: 07/11/2023  Primary Care Provider:  Theotis Haze LELON, NP Primary Cardiologist:  Maude Emmer, MD Primary Electrophysiologist: None   Past Medical History    Past Medical History:  Diagnosis Date   Allergy    Anginal pain (HCC)    Anxiety    Arthritis    Childhood asthma    Chronic lower back pain    Coronary artery disease    Diabetes mellitus without complication (HCC)    ELECTROCARDIOGRAM, ABNORMAL    GERD (gastroesophageal reflux disease)    Gout    Headache(784.0) 09/18/2011   maybe twice/wk; pressure on the brain   High cholesterol    Hypertension    Kidney stones    Lower GI bleed 09/18/2011   clots and everything; not lately   Myocardial infarction [facility]) 10/2005   Peripheral vascular disease (HCC)    LLE   Pneumonia 1990's   Seizures (HCC) 09/18/2011   blanks out on me/wife's report- pt states never had a seizure- said he would black out from pain yrs ago but never had a seizure -  08-15-2018   Shortness of breath 09/18/2011   @ rest; lying down; w/exertion    History of Present Illness  Willie Walker is a 58 y.o. male with a PMH of CAD s/p multiple PCI's and CABG x 5 07/2015, HTN, DM type II, CKD III b, HLD, PVD, polysubstance abuse who presents today for 72-month follow-up.   Willie Walker was last seen on 04/02/2023 for overdue follow-up of CAD.  Denies any chest pain or shortness of breath.  He was currently homeless with transportation issues was referred to our social work team for assistance.  He was also referred to nephrology due to CKD and referred to the lipid clinic to discuss initiation of PCSK9's due to history of myalgias.  He was unable to be reached by our social work team but was seen by our Pharm.D. on 05/26/2023 and reported readiness to give himself injections.  He was reached by our social work team on 05/26/2023 in office and noted history of substance abuse and  cutting back and was provided resources for housing assistance at [facility] for [facility].  Willie Walker presents today for 57-month follow-up.  He reports since his previous follow-up doing well from a cardiac standpoint. He experiences severe shoulder pain, describing it as 'bone on bone' and stating that it hurts 'real bad right here.' The pain has been persistent, affecting his ability to sleep and eat. Initially, the pain started on one side, improved, but then moved to the other side and has now returned to the original side. He has not followed up on a previously suggested procedure due to personal circumstances.  He is currently taking Repatha  every three months and simvastatin  for cholesterol management, with no issues reported with simvastatin , unlike previous experiences with Crestor, which caused achiness. He is also on aspirin  and recently had a Doppler ultrasound that showed mild narrowing on both sides. He has a history of high blood pressure, with a recent reading of 150/100, and is on medication to manage it. He is currently without stable housing and is staying at his sister's house temporarily. He is in contact with a Child psychotherapist and the [place] [facility] for housing assistance. He has not yet seen a nephrologist despite a previous referral, as he requires a new referral to proceed. He expresses a need for pain management  but prefers not to take pain pills, although he is open to using Tylenol  for relief. Patient denies chest pain, palpitations, dyspnea, PND, orthopnea, nausea, vomiting, dizziness, syncope, edema, weight gain, or early satiety.  Discussed the use of AI scribe software for clinical note transcription with the patient, who gave verbal consent to proceed.  History of Present Illness   Review of Systems  Please see the history of present illness.    All other systems reviewed and are otherwise negative except as noted above.  Physical Exam    Wt Readings from Last 3  Encounters:  05/25/23 158 lb 9.6 oz (71.9 kg)  04/02/23 157 lb 6.4 oz (71.4 kg)  01/04/23 156 lb 3.2 oz (70.9 kg)   CD:Uyzmz were no vitals filed for this visit.,There is no height or weight on file to calculate BMI. GEN: Well nourished, well developed in no acute distress Neck: No JVD; No carotid bruits Pulmonary: Clear to auscultation without rales, wheezing or rhonchi  Cardiovascular: Normal rate. Regular rhythm. Normal S1. Normal S2.   Murmurs: There is no murmur.  ABDOMEN: Soft, non-tender, non-distended EXTREMITIES:  No edema; No deformity  - Positive for bilateral shoulder pain  EKG/LABS/ Recent Cardiac Studies   ECG personally reviewed by me today -none completed today  Risk Assessment/Calculations:          Lab Results  Component Value Date   WBC 5.0 01/04/2023   HGB 14.8 01/04/2023   HCT 44.5 01/04/2023   MCV 88 01/04/2023   PLT 280 01/04/2023   Lab Results  Component Value Date   CREATININE 1.46 (H) 05/25/2023   BUN 19 05/25/2023   NA 141 05/25/2023   K 5.4 (H) 05/25/2023   CL 103 05/25/2023   CO2 24 05/25/2023   Lab Results  Component Value Date   CHOL 254 (H) 05/25/2023   HDL 65 05/25/2023   LDLCALC 144 (H) 05/25/2023   LDLDIRECT 123 (H) 04/02/2020   TRIG 249 (H) 05/25/2023   CHOLHDL 3.9 05/25/2023    Lab Results  Component Value Date   HGBA1C 7.7 (A) 04/15/2023   Assessment & Plan    Assessment and Plan Assessment & Plan  1. Coronary artery disease: -s/p multiple PCI's and CABG x 5 in 2017 -Today patient reports no chest pain or angina since previous follow-up. - Current GDMT with Zocor  40 mg daily, metoprolol  12.5 mg daily Repatha  every 3 months  2.  Hyperlipidemia: - Patient's last LDL cholesterol was 144 - Continue Zocor  40 mg and Repatha  as prescribed  3.Essential hypertension: -Patient's blood pressure today was stable at 110/78 - Continue metoprolol  12.5 mg and lisinopril  5 mg daily  4.  DM type II: - Patient's last hemoglobin  A1c was 8.4 - Patient may benefit from addition of SGLT2 patient continue current treatment plan per PCP  5.  History of polysubstance abuse: -Patient reports that he only uses occasional marijuana and does drink beer and alcohol socially.   6. Chronic Kidney Disease Stage 3B: - Patient referred at previous visit however not completed and will resend a referral today  7.  Social economic barriers: - Patient provided contact information for clinical social work - He is currently in the process of receiving resources for housing.  8. Left shoulder pain: Chronic rotator cuff issues affecting daily activities and sleep, more symptomatic on the right. - Contact Dr. Alena for surgical intervention follow-up. - Recommend Tylenol  Arthritis, two tablets every six hours. - Advise heating pad and ice for  relief. - Discuss steroid injection for temporary relief.   Disposition: Follow-up with Maude Emmer, MD or APP in 6 months    Signed, Wyn Raddle, Jackee Shove, NP 07/11/2023, 2:49 PM [facility]

## 2023-07-12 ENCOUNTER — Encounter: Payer: Self-pay | Admitting: Nurse Practitioner

## 2023-07-12 ENCOUNTER — Ambulatory Visit: Attending: Nurse Practitioner | Admitting: Nurse Practitioner

## 2023-07-12 VITALS — BP 110/78 | HR 58 | Ht 71.0 in | Wt 150.4 lb

## 2023-07-12 DIAGNOSIS — E1169 Type 2 diabetes mellitus with other specified complication: Secondary | ICD-10-CM | POA: Diagnosis not present

## 2023-07-12 DIAGNOSIS — E782 Mixed hyperlipidemia: Secondary | ICD-10-CM | POA: Diagnosis not present

## 2023-07-12 DIAGNOSIS — M25512 Pain in left shoulder: Secondary | ICD-10-CM | POA: Insufficient documentation

## 2023-07-12 DIAGNOSIS — N1832 Chronic kidney disease, stage 3b: Secondary | ICD-10-CM | POA: Diagnosis not present

## 2023-07-12 DIAGNOSIS — Z599 Problem related to housing and economic circumstances, unspecified: Secondary | ICD-10-CM | POA: Insufficient documentation

## 2023-07-12 DIAGNOSIS — I10 Essential (primary) hypertension: Secondary | ICD-10-CM | POA: Diagnosis not present

## 2023-07-12 DIAGNOSIS — I25119 Atherosclerotic heart disease of native coronary artery with unspecified angina pectoris: Secondary | ICD-10-CM | POA: Insufficient documentation

## 2023-07-12 NOTE — Patient Instructions (Addendum)
 Medication Instructions:  Your physician recommends that you continue on your current medications as directed. Please refer to the Current Medication list given to you today. *If you need a refill on your cardiac medications before your next appointment, please call your pharmacy*  Lab Work: None ordered If you have labs (blood work) drawn today and your tests are completely normal, you will receive your results only by: MyChart Message (if you have MyChart) OR A paper copy in the mail If you have any lab test that is abnormal or we need to change your treatment, we will call you to review the results.  Testing/Procedures: None ordered  Follow-Up: At [facility], you and your health needs are our priority.  As part of our continuing mission to provide you with exceptional heart care, our providers are all part of one team.  This team includes your primary Cardiologist (physician) and Advanced Practice Providers or APPs (Physician Assistants and Nurse Practitioners) who all work together to provide you with the care you need, when you need it.  Your next appointment:   6 month(s)  Provider:   Maude Emmer, MD    We recommend signing up for the patient portal called MyChart.  Sign up information is provided on this After Visit Summary.  MyChart is used to connect with patients for Virtual Visits (Telemedicine).  Patients are able to view lab/test results, encounter notes, upcoming appointments, etc.  Non-urgent messages can be sent to your provider as well.   To learn more about what you can do with MyChart, go to [email or web address].   Other Instructions Willie Walker PHONE NUMBER [phone] FOLLOW UP WITH DR Willie Walker FOR YOUR SHOULDER PAIN  You have been referred to NEPHROLOGY

## 2023-07-15 ENCOUNTER — Telehealth: Payer: Self-pay | Admitting: Licensed Clinical Social Worker

## 2023-07-15 NOTE — Telephone Encounter (Signed)
 H&V Care Navigation CSW Progress Note  Clinical Social Worker received call from pt to share that he is still looking for housing. LCSW provided him with the number for [facility] for Housing (had been called by [facility] but then never called her back) and Coord[employer] Entry as unstably staying with one of his sister's at this time. Shared unfortunately we do not have a direct placement system for housing/shelters but rather we collaborate with community partners to engage pt with whatever he may be eligible for. Shelter beds are available by first come first serve and are managed individually by those groups. Currently he is at his sisters hanging on the couch.  LCSW encouraged him to reach out to organizations provided as well as speak with Social Security to make sure that he is receiving full benefits he may be eligible for b/c at this time he has extremely low monthly income.   Pt agreeable, appreciative of guidance.  Patient is participating in a Managed Medicaid Plan:  Yes  SDOH Screenings   Food Insecurity: Food Insecurity Present (05/26/2023)  Housing: High Risk (05/26/2023)  Transportation Needs: No Transportation Needs (05/26/2023)  Utilities: Not At Risk (11/09/2022)  Alcohol Screen: Low Risk  (11/09/2022)  Depression (PHQ2-9): Medium Risk (05/25/2023)  Financial Resource Strain: High Risk (05/26/2023)  Physical Activity: Insufficiently Active (11/09/2022)  Social Connections: Unknown (11/09/2022)  Stress: No Stress Concern Present (11/09/2022)  Tobacco Use: Medium Risk (07/12/2023)  Health Literacy: Adequate Health Literacy (05/26/2023)    Marit Lark, MSW, LCSW Clinical Social Worker II [facility] Heart/Vascular Care Navigation  [phone]- work cell phone (preferred)

## 2023-07-16 ENCOUNTER — Ambulatory Visit: Admitting: Nurse Practitioner

## 2023-07-21 ENCOUNTER — Ambulatory Visit: Admitting: Orthopaedic Surgery

## 2023-07-21 DIAGNOSIS — M19012 Primary osteoarthritis, left shoulder: Secondary | ICD-10-CM

## 2023-07-21 MED ORDER — PREDNISONE 10 MG (21) PO TBPK: ORAL_TABLET | ORAL | 3 refills | Status: AC
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
## 2023-08-11 ENCOUNTER — Ambulatory Visit: Payer: Self-pay

## 2023-08-11 NOTE — Telephone Encounter (Signed)
 Looks like someone already scheduled him  for an in office visit before we could even send the referral or schedule a virtual visit

## 2023-08-11 NOTE — Telephone Encounter (Signed)
 FYI Only or Action Required?: FYI only for provider.  Patient was last seen in primary care on 05/25/2023 by Theotis Haze ORN, NP.  Called Nurse Triage reporting Dental Pain.  Symptoms began 1-2 weeks ago.  Interventions attempted: OTC medications: Tylenol .  Symptoms are: gradually worsening.  Triage Disposition: See Physician Within 24 Hours (overriding See HCP Within 4 Hours (Or PCP Triage))  Patient/caregiver understands and will follow disposition?: Yes                             Copied from CRM #[id number]. Topic: Clinical - Red Word Triage >> Aug 11, 2023  3:13 PM Rosaria BRAVO wrote: Red Word that prompted transfer to Nurse Triage: Needs an emergency tooth extraction, says his mouth and jaw is swollen and painful. Reason for Disposition  [1] SEVERE pain (e.g., excruciating, unable to eat, unable to do any normal activities) AND [2] not improved 2 hours after pain medicine  Answer Assessment - Initial Assessment Questions 1. LOCATION: Which tooth is hurting?  (e.g., right-side/left-side, upper/lower, front/back)     Left upper back tooth 2. ONSET: When did the toothache start?  (e.g., hours, days)      1-2 weeks  3. SEVERITY: How bad is the toothache?  (Scale 1-10; mild, moderate or severe)     Rates pain an 8 at this time, states Tylenol  is not working  4. SWELLING: Is there any visible swelling of your face?     Left side of face is swollen 5. OTHER SYMPTOMS: Do you have any other symptoms? (e.g., fever)     States eating, chewing and swallowing water is painful, denies fever, denies difficulty breathing, denies tongue swelling    No availability in office today. Scheduled patient for first available appointment (tomorrow morning). Patient states he does not have a dentist.  Protocols used: Toothache-A-AH

## 2023-08-12 ENCOUNTER — Encounter: Payer: Self-pay | Admitting: Internal Medicine

## 2023-08-12 ENCOUNTER — Ambulatory Visit: Payer: Self-pay | Attending: Internal Medicine | Admitting: Internal Medicine

## 2023-08-12 VITALS — BP 129/89 | HR 76 | Temp 97.4°F | Ht 71.0 in | Wt 157.0 lb

## 2023-08-12 DIAGNOSIS — K0889 Other specified disorders of teeth and supporting structures: Secondary | ICD-10-CM

## 2023-08-12 MED ORDER — AMOXICILLIN 500 MG PO CAPS
500.0000 mg | ORAL_CAPSULE | Freq: Three times a day (TID) | ORAL | 0 refills | Status: AC
Start: 2023-08-12 — End: 2023-08-19

## 2023-08-12 MED ORDER — TRAMADOL HCL 50 MG PO TABS
50.0000 mg | ORAL_TABLET | Freq: Two times a day (BID) | ORAL | 0 refills | Status: AC | PRN
Start: 2023-08-12 — End: 2023-08-17
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
## 2023-08-12 NOTE — Telephone Encounter (Signed)
 Noted

## 2023-08-12 NOTE — Patient Instructions (Signed)
 VISIT SUMMARY:  You came in today because of swelling and dental pain on the right side of your jaw, which has been bothering you for the past two to three weeks. The pain is coming from a decayed tooth in your upper right jaw, and it has been sensitive to touch and cold fluids. You have been avoiding chewing on that side due to the pain.  YOUR PLAN:  -DENTAL PAIN DUE TO EXPOSED NERVE: You have significant pain and sensitivity due to an exposed nerve from a decayed tooth, which may also involve gum inflammation or infection. You have been prescribed antibiotics for 7 days to address any possible infection and tramadol  for 5 days to help manage the pain. Please be cautious as tramadol  can cause drowsiness and constipation, and avoid driving or operating machinery while taking it. It is important to contact a dentist for further evaluation and treatment.  INSTRUCTIONS:  Please follow up with a dentist as soon as possible for further evaluation and treatment of your dental issue.

## 2023-08-18 ENCOUNTER — Ambulatory Visit: Admitting: Nurse Practitioner

## 2023-08-24 ENCOUNTER — Telehealth: Payer: Self-pay | Admitting: Nurse Practitioner

## 2023-08-24 NOTE — Telephone Encounter (Signed)
 Contacted pt pt has new number unconfirmed appt

## 2023-08-25 ENCOUNTER — Ambulatory Visit: Admitting: Nurse Practitioner

## 2023-09-01 DIAGNOSIS — H524 Presbyopia: Secondary | ICD-10-CM | POA: Diagnosis not present

## 2023-09-30 ENCOUNTER — Ambulatory Visit: Payer: Self-pay

## 2023-09-30 ENCOUNTER — Other Ambulatory Visit (HOSPITAL_COMMUNITY): Payer: Self-pay | Admitting: Cardiovascular Disease

## 2023-09-30 NOTE — Telephone Encounter (Signed)
 FYI Only or Action Required?: FYI only for provider.  Patient was last seen in primary care on 08/12/2023 by Vicci Barnie NOVAK, MD.  Called Nurse Triage reporting Chest Pain.  Symptoms began a week ago.  Interventions attempted: Rest, hydration, or home remedies.  Symptoms are: unchanged.  Triage Disposition: See PCP When Office is Open (Within 3 Days)  Patient/caregiver understands and will follow disposition?: Yes  **Appt. Scheduled for 9/12**         Copied from CRM #[id number]. Topic: Clinical - Red Word Triage >> Sep 30, 2023 11:52 AM Myrick T wrote: Red Word that prompted transfer to Nurse Triage: patient said he had heart surgery in 2018 and he is having sharp pains in his chest above the incision across his collar bone. He said it is concerning to him. Reason for Disposition  [1] Chest pain(s) lasting a few seconds AND [2] persists > 3 days  Answer Assessment - Initial Assessment Questions 1. LOCATION: Where does it hurt?        Down the middle of chest, at top of collar bone,not a true chest pain.    2. RADIATION: Does the pain go anywhere else? (e.g., into neck, jaw, arms, back)     No    3. ONSET: When did the chest pain begin? (Minutes, hours or days)       X 1 week, during furniture moving   4. PATTERN: Does the pain come and go, or has it been constant since it started?  Does it get worse with exertion?      Intermittent   5. DURATION: How long does it last (e.g., seconds, minutes, hours)    30 seconds, then resolves   6. SEVERITY: How bad is the pain?  (e.g., Scale 1-10; mild, moderate, or severe)     6/10  7. CARDIAC RISK FACTORS: Do you have any history of heart problems or risk factors for heart disease? (e.g., angina, prior heart attack; diabetes, high blood pressure, high cholesterol, smoker, or strong family history of heart disease)     Yes  8. PULMONARY RISK FACTORS: Do you have any history of lung disease?  (e.g.,  blood clots in lung, asthma, emphysema, birth control pills)     No   9. CAUSE: What do you think is causing the chest pain?     Strenuous activity   10. OTHER SYMPTOMS: Do you have any other symptoms? (e.g., dizziness, nausea, vomiting, sweating, fever, difficulty breathing, cough)  No   Patient had previous heart procedure in 2018, he is concerned that the pain is from strenuous activity of moving heavy objects a week ago. E states the pain is near the incision site from years ago.  Protocols used: Chest Pain-A-AH

## 2023-09-30 NOTE — Telephone Encounter (Signed)
 Noted

## 2023-09-30 NOTE — Telephone Encounter (Signed)
 Patient has appointment with a provider at another clinic 9/12.

## 2023-10-01 ENCOUNTER — Ambulatory Visit: Payer: Self-pay | Admitting: Nurse Practitioner

## 2023-10-11 ENCOUNTER — Ambulatory Visit: Payer: Self-pay | Admitting: Nurse Practitioner

## 2023-10-12 ENCOUNTER — Inpatient Hospital Stay (HOSPITAL_COMMUNITY): Admission: RE | Admit: 2023-10-12 | Source: Ambulatory Visit

## 2023-10-13 ENCOUNTER — Ambulatory Visit: Payer: Self-pay

## 2023-10-13 ENCOUNTER — Ambulatory Visit: Payer: Self-pay | Admitting: Nurse Practitioner

## 2023-10-13 NOTE — Telephone Encounter (Signed)
 FYI

## 2023-10-13 NOTE — Telephone Encounter (Signed)
 FYI Only or Action Required?: FYI only for provider.  Patient was last seen in primary care on 08/12/2023 by Vicci Barnie NOVAK, MD.  Called Nurse Triage reporting Advice Only.  Symptoms began today.  Interventions attempted: Nothing.  Symptoms are: stable.  Triage Disposition: Information or Advice Only Call  Patient/caregiver understands and will follow disposition?: Yes    Copied from CRM #[id number]. Topic: Clinical - Red Word Triage >> Oct 13, 2023  1:50 PM Wess RAMAN wrote: Red Word that prompted transfer to Nurse Triage: Patient had an appt for: Chest discomfort (Not acute Chest Pain)// RN Triage. Patient arrived at wrong clinic and is now fatigue due to having to walk in the heat and ended up at the wrong clinic and wishes to reschedule Reason for Disposition  General information question, no triage required and triager able to answer question  Answer Assessment - Initial Assessment Questions 1. REASON FOR CALL: What is the main reason for your call? or How can I best help you?     Pt was upset because he went to the wrong location and wanted appt at other location cancelled.  2. SYMPTOMS : Do you have any symptoms?     Pt denies any symptoms currently. Stated that he was having some chest discomfort from doing some heavy lifting he shouldn't have been doing and feels fine now. He states that he felt fatigued walking from the bus stop to the clinic in the hot sun but since being in the [facility] feels better and does not feel like he needs to be seen.  3. OTHER QUESTIONS: Do you have any other questions?     no  Protocols used: Information Only Call - No Triage-A-AH

## 2023-10-13 NOTE — Telephone Encounter (Signed)
 Noted

## 2023-10-21 ENCOUNTER — Ambulatory Visit (HOSPITAL_COMMUNITY)
Admission: EM | Admit: 2023-10-21 | Discharge: 2023-10-21 | Disposition: A | Discharge status: Home / Self Care | Attending: Family Medicine | Admitting: Family Medicine

## 2023-10-21 ENCOUNTER — Other Ambulatory Visit (HOSPITAL_COMMUNITY)

## 2023-10-21 ENCOUNTER — Ambulatory Visit (INDEPENDENT_AMBULATORY_CARE_PROVIDER_SITE_OTHER)

## 2023-10-21 ENCOUNTER — Encounter (HOSPITAL_COMMUNITY): Payer: Self-pay

## 2023-10-21 DIAGNOSIS — M2550 Pain in unspecified joint: Secondary | ICD-10-CM | POA: Diagnosis not present

## 2023-10-21 DIAGNOSIS — M79641 Pain in right hand: Secondary | ICD-10-CM | POA: Diagnosis not present

## 2023-10-21 DIAGNOSIS — S63001A Unspecified subluxation of right wrist and hand, initial encounter: Secondary | ICD-10-CM | POA: Diagnosis not present

## 2023-10-21 DIAGNOSIS — Z23 Encounter for immunization: Secondary | ICD-10-CM

## 2023-10-21 DIAGNOSIS — S0081XA Abrasion of other part of head, initial encounter: Secondary | ICD-10-CM | POA: Diagnosis not present

## 2023-10-21 MED ORDER — ACETAMINOPHEN 325 MG PO TABS
975.0000 mg | ORAL_TABLET | Freq: Once | ORAL | Status: AC
  Administered 2023-10-21: 975 mg via ORAL

## 2023-10-21 MED ORDER — TETANUS-DIPHTH-ACELL PERTUSSIS 5-2-15.5 LF-MCG/0.5 IM SUSP
0.5000 mL | Freq: Once | INTRAMUSCULAR | Status: AC
  Administered 2023-10-21: 0.5 mL via INTRAMUSCULAR

## 2023-10-21 MED ORDER — ACETAMINOPHEN 325 MG PO TABS
ORAL_TABLET | ORAL | Status: AC
  Filled 2023-10-21: qty 3

## 2023-10-21 MED ORDER — TETANUS-DIPHTH-ACELL PERTUSSIS 5-2-15.5 LF-MCG/0.5 IM SUSP
INTRAMUSCULAR | Status: AC
  Filled 2023-10-21: qty 0.5
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
## 2023-10-21 NOTE — Discharge Instructions (Signed)
 There were no broken bones on the xray  You have been given a Tdap vaccination to boost your tetanus immunity  Take Tylenol  500 mg over-the-counter--2 every 6 hours as needed for pain.  We gave you 1 dose of this medication here tonight.  Wash the wound on your right eyebrow 1 or 2 times a day with soapy water and then put new antibiotic ointment on it and a Band-Aid.
